# Patient Record
Sex: Female | Born: 1960 | Race: Black or African American | Hispanic: No | Smoking: Current every day smoker
Health system: Southern US, Community
[De-identification: ages and names within clinical notes are randomized; demographics above are authoritative.]

## PROBLEM LIST (undated history)

## (undated) DIAGNOSIS — R011 Cardiac murmur, unspecified: Secondary | ICD-10-CM

## (undated) DIAGNOSIS — K219 Gastro-esophageal reflux disease without esophagitis: Secondary | ICD-10-CM

## (undated) DIAGNOSIS — K3184 Gastroparesis: Secondary | ICD-10-CM

## (undated) DIAGNOSIS — F32A Depression, unspecified: Secondary | ICD-10-CM

## (undated) DIAGNOSIS — R51 Headache: Secondary | ICD-10-CM

## (undated) DIAGNOSIS — F419 Anxiety disorder, unspecified: Secondary | ICD-10-CM

## (undated) DIAGNOSIS — R569 Unspecified convulsions: Secondary | ICD-10-CM

## (undated) DIAGNOSIS — I639 Cerebral infarction, unspecified: Secondary | ICD-10-CM

## (undated) DIAGNOSIS — Z9109 Other allergy status, other than to drugs and biological substances: Secondary | ICD-10-CM

## (undated) DIAGNOSIS — I509 Heart failure, unspecified: Secondary | ICD-10-CM

## (undated) DIAGNOSIS — E119 Type 2 diabetes mellitus without complications: Secondary | ICD-10-CM

## (undated) DIAGNOSIS — G4730 Sleep apnea, unspecified: Secondary | ICD-10-CM

## (undated) DIAGNOSIS — N189 Chronic kidney disease, unspecified: Secondary | ICD-10-CM

## (undated) DIAGNOSIS — I739 Peripheral vascular disease, unspecified: Secondary | ICD-10-CM

## (undated) DIAGNOSIS — N3090 Cystitis, unspecified without hematuria: Secondary | ICD-10-CM

## (undated) DIAGNOSIS — J189 Pneumonia, unspecified organism: Secondary | ICD-10-CM

## (undated) DIAGNOSIS — M797 Fibromyalgia: Secondary | ICD-10-CM

## (undated) DIAGNOSIS — J449 Chronic obstructive pulmonary disease, unspecified: Secondary | ICD-10-CM

## (undated) DIAGNOSIS — IMO0002 Reserved for concepts with insufficient information to code with codable children: Secondary | ICD-10-CM

## (undated) DIAGNOSIS — D649 Anemia, unspecified: Secondary | ICD-10-CM

## (undated) DIAGNOSIS — K589 Irritable bowel syndrome without diarrhea: Secondary | ICD-10-CM

## (undated) DIAGNOSIS — I10 Essential (primary) hypertension: Secondary | ICD-10-CM

## (undated) DIAGNOSIS — F329 Major depressive disorder, single episode, unspecified: Secondary | ICD-10-CM

## (undated) HISTORY — PX: COLONOSCOPY: SHX174

## (undated) HISTORY — PX: NECK SURGERY: SHX720

## (undated) HISTORY — PX: DILATION AND CURETTAGE OF UTERUS: SHX78

## (undated) HISTORY — PX: CHOLECYSTECTOMY: SHX55

---

## 1998-03-17 HISTORY — PX: PARTIAL HYSTERECTOMY: SHX80

## 2012-01-31 ENCOUNTER — Ambulatory Visit: Payer: Medicare Other | Attending: Gastroenterology | Admitting: Physical Therapy

## 2012-01-31 DIAGNOSIS — M2569 Stiffness of other specified joint, not elsewhere classified: Secondary | ICD-10-CM | POA: Insufficient documentation

## 2012-01-31 DIAGNOSIS — M545 Low back pain, unspecified: Secondary | ICD-10-CM | POA: Insufficient documentation

## 2012-01-31 DIAGNOSIS — IMO0001 Reserved for inherently not codable concepts without codable children: Secondary | ICD-10-CM | POA: Insufficient documentation

## 2012-01-31 DIAGNOSIS — M2420 Disorder of ligament, unspecified site: Secondary | ICD-10-CM | POA: Insufficient documentation

## 2012-01-31 DIAGNOSIS — M629 Disorder of muscle, unspecified: Secondary | ICD-10-CM | POA: Insufficient documentation

## 2012-02-01 ENCOUNTER — Ambulatory Visit: Payer: Medicare Other | Admitting: Physical Therapy

## 2012-02-06 ENCOUNTER — Ambulatory Visit: Payer: Medicare Other | Admitting: Physical Therapy

## 2012-02-14 ENCOUNTER — Ambulatory Visit: Payer: Medicare Other | Admitting: Physical Therapy

## 2012-02-20 ENCOUNTER — Ambulatory Visit: Payer: PRIVATE HEALTH INSURANCE | Attending: Gastroenterology | Admitting: Physical Therapy

## 2012-02-20 DIAGNOSIS — M2420 Disorder of ligament, unspecified site: Secondary | ICD-10-CM | POA: Insufficient documentation

## 2012-02-20 DIAGNOSIS — M545 Low back pain, unspecified: Secondary | ICD-10-CM | POA: Insufficient documentation

## 2012-02-20 DIAGNOSIS — IMO0001 Reserved for inherently not codable concepts without codable children: Secondary | ICD-10-CM | POA: Insufficient documentation

## 2012-02-20 DIAGNOSIS — M629 Disorder of muscle, unspecified: Secondary | ICD-10-CM | POA: Insufficient documentation

## 2012-02-20 DIAGNOSIS — M2569 Stiffness of other specified joint, not elsewhere classified: Secondary | ICD-10-CM | POA: Insufficient documentation

## 2012-02-27 ENCOUNTER — Ambulatory Visit: Payer: PRIVATE HEALTH INSURANCE | Admitting: Physical Therapy

## 2012-03-05 ENCOUNTER — Ambulatory Visit: Payer: PRIVATE HEALTH INSURANCE | Admitting: Physical Therapy

## 2012-03-12 ENCOUNTER — Ambulatory Visit: Payer: PRIVATE HEALTH INSURANCE | Admitting: Physical Therapy

## 2012-03-19 ENCOUNTER — Ambulatory Visit: Payer: PRIVATE HEALTH INSURANCE | Admitting: Physical Therapy

## 2012-09-25 ENCOUNTER — Emergency Department (HOSPITAL_COMMUNITY)
Admission: EM | Admit: 2012-09-25 | Discharge: 2012-09-25 | Disposition: A | Discharge status: Home / Self Care | Payer: PRIVATE HEALTH INSURANCE | Attending: Emergency Medicine | Admitting: Emergency Medicine

## 2012-09-25 ENCOUNTER — Emergency Department (HOSPITAL_COMMUNITY): Payer: PRIVATE HEALTH INSURANCE

## 2012-09-25 ENCOUNTER — Encounter (HOSPITAL_COMMUNITY): Payer: Self-pay | Admitting: Emergency Medicine

## 2012-09-25 DIAGNOSIS — N189 Chronic kidney disease, unspecified: Secondary | ICD-10-CM | POA: Insufficient documentation

## 2012-09-25 DIAGNOSIS — Z8673 Personal history of transient ischemic attack (TIA), and cerebral infarction without residual deficits: Secondary | ICD-10-CM | POA: Insufficient documentation

## 2012-09-25 DIAGNOSIS — E119 Type 2 diabetes mellitus without complications: Secondary | ICD-10-CM | POA: Insufficient documentation

## 2012-09-25 DIAGNOSIS — F17200 Nicotine dependence, unspecified, uncomplicated: Secondary | ICD-10-CM | POA: Insufficient documentation

## 2012-09-25 DIAGNOSIS — Z8719 Personal history of other diseases of the digestive system: Secondary | ICD-10-CM | POA: Insufficient documentation

## 2012-09-25 DIAGNOSIS — Z79899 Other long term (current) drug therapy: Secondary | ICD-10-CM | POA: Insufficient documentation

## 2012-09-25 DIAGNOSIS — R51 Headache: Secondary | ICD-10-CM | POA: Insufficient documentation

## 2012-09-25 DIAGNOSIS — Z87448 Personal history of other diseases of urinary system: Secondary | ICD-10-CM | POA: Insufficient documentation

## 2012-09-25 DIAGNOSIS — Z880 Allergy status to penicillin: Secondary | ICD-10-CM | POA: Insufficient documentation

## 2012-09-25 DIAGNOSIS — R569 Unspecified convulsions: Secondary | ICD-10-CM

## 2012-09-25 HISTORY — DX: Cerebral infarction, unspecified: I63.9

## 2012-09-25 HISTORY — DX: Type 2 diabetes mellitus without complications: E11.9

## 2012-09-25 HISTORY — DX: Cystitis, unspecified without hematuria: N30.90

## 2012-09-25 HISTORY — DX: Unspecified convulsions: R56.9

## 2012-09-25 HISTORY — DX: Gastroparesis: K31.84

## 2012-09-25 HISTORY — DX: Chronic kidney disease, unspecified: N18.9

## 2012-09-25 LAB — CBC WITH DIFFERENTIAL/PLATELET
Basophils Relative: 0 % (ref 0–1)
HCT: 35.4 % — ABNORMAL LOW (ref 36.0–46.0)
Hemoglobin: 12.3 g/dL (ref 12.0–15.0)
Lymphs Abs: 1.4 10*3/uL (ref 0.7–4.0)
MCHC: 34.7 g/dL (ref 30.0–36.0)
Monocytes Absolute: 0.3 10*3/uL (ref 0.1–1.0)
Monocytes Relative: 6 % (ref 3–12)
Neutro Abs: 3.2 10*3/uL (ref 1.7–7.7)
Neutrophils Relative %: 65 % (ref 43–77)
RBC: 4.42 MIL/uL (ref 3.87–5.11)

## 2012-09-25 LAB — BASIC METABOLIC PANEL
BUN: 27 mg/dL — ABNORMAL HIGH (ref 6–23)
CO2: 20 mEq/L (ref 19–32)
Chloride: 103 mEq/L (ref 96–112)
Creatinine, Ser: 1.42 mg/dL — ABNORMAL HIGH (ref 0.50–1.10)
Glucose, Bld: 88 mg/dL (ref 70–99)
Potassium: 3.3 mEq/L — ABNORMAL LOW (ref 3.5–5.1)

## 2012-09-25 LAB — RAPID URINE DRUG SCREEN, HOSP PERFORMED
Barbiturates: NOT DETECTED
Benzodiazepines: NOT DETECTED
Cocaine: NOT DETECTED
Opiates: NOT DETECTED
Tetrahydrocannabinol: POSITIVE — AB

## 2012-09-25 LAB — URINALYSIS, ROUTINE W REFLEX MICROSCOPIC
Bilirubin Urine: NEGATIVE
Glucose, UA: NEGATIVE mg/dL
Hgb urine dipstick: NEGATIVE
Ketones, ur: NEGATIVE mg/dL
Nitrite: NEGATIVE
Specific Gravity, Urine: 1.013 (ref 1.005–1.030)
pH: 6 (ref 5.0–8.0)

## 2012-09-25 MED ORDER — HYDROMORPHONE HCL PF 1 MG/ML IJ SOLN
1.0000 mg | Freq: Once | INTRAMUSCULAR | Status: AC
  Administered 2012-09-25: 1 mg via INTRAVENOUS
  Filled 2012-09-25: qty 1

## 2012-09-25 MED ORDER — SODIUM CHLORIDE 0.9 % IV SOLN: INTRAVENOUS | Status: DC

## 2012-09-25 MED ORDER — ONDANSETRON HCL 4 MG/2ML IJ SOLN
4.0000 mg | Freq: Once | INTRAMUSCULAR | Status: AC
  Administered 2012-09-25: 4 mg via INTRAVENOUS
  Filled 2012-09-25: qty 2

## 2012-09-25 MED ORDER — SODIUM CHLORIDE 0.9 % IV BOLUS (SEPSIS)
1000.0000 mL | Freq: Once | INTRAVENOUS | Status: AC
  Administered 2012-09-25: 1000 mL via INTRAVENOUS

## 2012-09-25 MED ORDER — OXYCODONE-ACETAMINOPHEN 5-325 MG PO TABS: 1.0000 | ORAL_TABLET | ORAL | Status: DC | PRN

## 2012-09-25 NOTE — ED Notes (Signed)
Pt coming from pain management clinic. After pain injection in back, pt began to have focal activity that progressed to grand mal major for 1-2 minutes according to MD. Reported to EMS that pt was slightly postictal afterwords, and could not remember month then had another episode of seizure like activity. EMS reported that while pt was being transported she had grand mal like shaking but talked during the entire episode with no post ictal period. Pt has remote hx of seizures in 2002

## 2012-09-25 NOTE — ED Notes (Signed)
Pt states she has a HA. Pt states it is worse than her normal migraine

## 2012-09-25 NOTE — Progress Notes (Signed)
Patient reports her pcp is Dr. Donia Guiles with [facility]

## 2012-09-25 NOTE — ED Provider Notes (Signed)
CSN: [id number]     Arrival date & time 09/25/12  1234 History     First MD Initiated Contact with Patient 09/25/12 1301     Chief Complaint  Patient presents with  . Seizures   (Consider location/radiation/quality/duration/timing/severity/associated sxs/prior Treatment) HPI Comments: Di Jasmer is a 52 y.o. female who presents for evaluation of seizure. She was at her pain management. Doctor's office today, and after she received lumbosacral injections for pain, she had a focal, then generalized seizure.  She does not know what medicine she she got. EMS transported her here. During EMS transport, she had another generalized seizure. During that seizure. She was able to talk to EMS throughout. He did not report a post-ictal state. She denies recent illness. Her pain management doctor stopped giving her narcotics because she tested positive for THC. She denies recent illnesses, including fever, chills, cough, shortness of breath, change in bowel or urinary habits, weakness, dizziness, or paresthesia. There are no other known modifying factors.  Patient is a 52 y.o. female presenting with seizures. The history is provided by the patient.  Seizures   Past Medical History  Diagnosis Date  . Seizures   . Cystitis   . Gastroparesis   . Stroke 2008 and 2010  . Type 2 diabetes mellitus   . Chronic kidney disease (CKD)    History reviewed. No pertinent past surgical history. History reviewed. No pertinent family history. History  Substance Use Topics  . Smoking status: Current Every Day Smoker  . Smokeless tobacco: Not on file  . Alcohol Use: No   OB History   Grav Para Term Preterm Abortions TAB SAB Ect Mult Living                 Review of Systems  Neurological: Positive for seizures.  All other systems reviewed and are negative.    Allergies  Codeine; Doxycycline; Macrolides and ketolides; Penicillins; Shellfish allergy; Fish allergy; Gluten meal;  Hydrocodone-acetaminophen; Other; Vancomycin; Bactrim; Ciprofloxacin hcl; and Eggs or egg-derived products  Home Medications   Current Outpatient Rx  Name  Route  Sig  Dispense  Refill  . buPROPion (WELLBUTRIN XL) 150 MG 24 hr tablet   Oral   Take 150 mg by mouth daily.         . cholecalciferol (VITAMIN D) 1000 UNITS tablet   Oral   Take 1,000 Units by mouth daily.         . clonazePAM (KLONOPIN) 1 MG tablet   Oral   Take 1 mg by mouth 2 (two) times daily.         Marland Kitchen dexlansoprazole (DEXILANT) 60 MG capsule   Oral   Take 60 mg by mouth daily.         . diphenhydrAMINE (BENADRYL) 25 MG tablet   Oral   Take 25 mg by mouth every 6 (six) hours as needed for itching or allergies (allergic reactions).         . Fluticasone-Salmeterol (ADVAIR) 100-50 MCG/DOSE AEPB   Inhalation   Inhale 1 puff into the lungs every 12 (twelve) hours.         . hydrOXYzine (VISTARIL) 50 MG capsule   Oral   Take 50 mg by mouth every 8 (eight) hours as needed for anxiety.         . Linaclotide (LINZESS) 145 MCG CAPS capsule   Oral   Take 145 mcg by mouth daily. 30 minutes before eating         .  loratadine (CLARITIN) 10 MG tablet   Oral   Take 10 mg by mouth daily.         . montelukast (SINGULAIR) 10 MG tablet   Oral   Take 10 mg by mouth at bedtime.         . pentosan polysulfate (ELMIRON) 100 MG capsule   Oral   Take 100 mg by mouth 3 (three) times daily before meals.         . potassium chloride SA (K-DUR,KLOR-CON) 20 MEQ tablet   Oral   Take 20 mEq by mouth 2 (two) times daily.         . promethazine (PHENERGAN) 25 MG tablet   Oral   Take 25 mg by mouth 3 (three) times daily as needed for nausea.         Marland Kitchen tiZANidine (ZANAFLEX) 4 MG tablet   Oral   Take 4 mg by mouth 3 (three) times daily.         . traZODone (DESYREL) 150 MG tablet   Oral   Take 150-300 mg by mouth at bedtime.         . triamterene-hydrochlorothiazide (MAXZIDE-25) 37.5-25 MG  per tablet   Oral   Take 1 tablet by mouth daily.          BP 111/73  Pulse 68  Temp(Src) 98.4 F (36.9 C) (Oral)  Resp 13  SpO2 96% Physical Exam  Nursing note and vitals reviewed. Constitutional: She is oriented to person, place, and time. She appears well-developed and well-nourished.  HENT:  Head: Normocephalic and atraumatic.  No visible tongue trauma  Eyes: Conjunctivae and EOM are normal. Pupils are equal, round, and reactive to light.  Neck: Normal range of motion and phonation normal. Neck supple.  Cardiovascular: Normal rate, regular rhythm and intact distal pulses.   Pulmonary/Chest: Effort normal and breath sounds normal. She exhibits no tenderness.  Abdominal: Soft. She exhibits no distension. There is no tenderness. There is no guarding.  Musculoskeletal: Normal range of motion.  Neurological: She is alert and oriented to person, place, and time. She has normal strength. She exhibits normal muscle tone.  No no aphasia, no dysarthria, no nystagmus.  Skin: Skin is warm and dry.  Psychiatric: She has a normal mood and affect. Her behavior is normal. Judgment and thought content normal.    ED Course   Procedures (including critical care time)  1333- consultation with Dr. Jordan Likes, her pain managing physician, who attended her today during the episode of shaking. He, states that she received 80 mg Triamcinolone under fluoroscopic guidance. Then, when she attempted to stand up, she had a far away look at her eyes, laid back down, and had a generalized tonic-clonic seizure for 60 seconds. She then awoke immediately, and was conversant. About 3 minutes later, she had another episode, again without a post-ictal state. This was her fourth injection. She tolerated the others without problem. The patient had told him earlier that she did not take her Klonopin today. He reiterated the other things at the patient stated above.   Evaluation for recurrent seizure, initiated. The  seizure pads are on the patient's bed.  4:50 PM Reevaluation with update and discussion. After initial assessment and treatment, an updated evaluation reveals she continues to have a headache. Vital signs are repeated and normal. Parisa Pinela L     Labs Reviewed  CBC WITH DIFFERENTIAL - Abnormal; Notable for the following:    HCT 35.4 (*)    Platelets 139 (*)  All other components within normal limits  BASIC METABOLIC PANEL - Abnormal; Notable for the following:    Potassium 3.3 (*)    BUN 27 (*)    Creatinine, Ser 1.42 (*)    GFR calc non Af Amer 42 (*)    GFR calc Af Amer 49 (*)    All other components within normal limits  URINE RAPID DRUG SCREEN (HOSP PERFORMED) - Abnormal; Notable for the following:    Tetrahydrocannabinol POSITIVE (*)    All other components within normal limits  URINALYSIS, ROUTINE W REFLEX MICROSCOPIC  ETHANOL   Ct Head Wo Contrast  09/25/2012   *RADIOLOGY REPORT*  Clinical Data: Seizures; recent trauma  CT HEAD WITHOUT CONTRAST  Technique:  Contiguous axial images were obtained from the base of the skull through the vertex without contrast. Study was obtained within 24 hours of patient arrival at the emergency department.  Comparison:  Brain MRI Jun 14, 2012 and brain CT Jun 14, 2012  Findings: The ventricles are normal in size and configuration. There is no mass, hemorrhage, extra-axial fluid collection, or midline shift.  There is minimal small vessel disease in the centra semiovale bilaterally.  Wallace Cullens and white compartments elsewhere normal.  There is no acute appearing infarct on this study.  Bony calvarium appears intact.  Mastoid air cells are clear.  IMPRESSION: Minimal periventricular small vessel disease.  Study otherwise unremarkable.   Original Report Authenticated By: Bretta Bang, M.D.   1. Seizure   2. Headache     MDM  Apparent seizure. No clear cause. History seizures in the past, with toxemia and 1 other, remotely. No recurrent seizures  in ED. She is stable for discharge with outpatient management. Her headache is nonspecific. I doubt meningitis, intracranial bleeding, sinusitis, or encephalitis. Doubt metabolic instability, serious bacterial infection or impending vascular collapse; the patient is stable for discharge.  Nursing Notes Reviewed/ Care Coordinated, and agree without changes. Applicable Imaging Reviewed.  Interpretation of Laboratory Data incorporated into ED treatment   Plan: Home Medications- usual; Home Treatments and Observation- NO DRIVING until released, rest, watch for progressive sx; return here if the recommended treatment, does not improve the symptoms; Recommended follow up- PCP prn, Neurology 1-2 weeks.     Flint Melter, MD 09/26/12 [phone]

## 2012-11-20 NOTE — H&P (Signed)
History of Present Illness  The patient is a 52 year old female who presents today for follow up of their back. The patient is being followed for their back pain and cervical disk displacement. They are now week(s) out from last visit. Symptoms reported today include: stiffness, while the patient does not report symptoms of: pain. The patient states that they are doing well. Current treatment includes: pain medications. The following medication has been used for pain control: none. The patient reports their current pain level to be mild. The patient presents today following ESI (10/23/12-- much improved ). Note for "Follow-up back": Patient has had a head ache for several weeks and went to [facility] fro evaluation, but they didn't' give her any explanation     Subjective Transcription  This is a 52 year old black female who comes in for follow up visit for neck pain and radicular symptoms. She recently had a right C6 selective nerve root block by Dr. Ethelene Hal on 10/23/12. Did not have much improvement with that. C spine MRI scan performed 09/17/12 showed C5-6 mild disc bulge osteophyte complex extensive to the right, moderate right and left foraminal uncovertebral hypertrophy and small right paracentral protrusion with mild flattening of the ventral thecal sac and minimal flattening of the ventral cord. Mild central canal stenosis. Moderate right foraminal stenosis with abutment of the exiting right C6 nerve root. Right arm symptoms unchanged. She is wanting to proceed with scheduling definitive treatment with C5-6 ACDF. The patient's medical history is reviewed and she is allergic to multiple antibiotics including Cipro, Doxycycline, MacroBid, penicillin, TMP, SMX, Vancomycin. All reactions are rash. She states she has taken Cefdinir in the past and only had GI upset.     Allergies Ciprofloxacin *CHEMICALS* Codeine Phosphate *ANALGESICS - OPIOID* Doxycycline Hyclate  *TETRACYCLINES* Nitrofurantoin Monohyd Macro *URINARY ANTI-INFECTIVES* Macrobid *URINARY ANTI-INFECTIVES* Penicillamine *ASSORTED CLASSES* Fish Meal *CHEMICALS*. shellfish and fish Sulfamethoxazole-Trimethoprim *ANTI-INFECTIVE AGENTS - MISC.* Peanut Oil *CHEMICALS*. ALL TREE NUTS Egg/Pro *DIETARY PRODUCTS/DIETARY MANAGEMENT PRODUCTS* Metoclopramide HCl *GASTROINTESTINAL AGENTS - MISC.* Propoxyphene N-Acetaminophen *ANALGESICS - OPIOID* TraMADol HCl *CHEMICALS* Adhesive Bandages *MEDICAL DEVICES* Hydrocodone-Acetaminophen *ANALGESICS - OPIOID* Vancomycin HCl *ANTI-INFECTIVE AGENTS - MISC.* Influenza Virus Vaccine *VACCINES* Wheat Germ Oil *VITAMINS*. WHEAT GLUTEN    Social History Alcohol use. former drinker Current work status. disabled Drug/Alcohol Rehab (Currently). no Drug/Alcohol Rehab (Previously). no Exercise. Exercises daily; does other Illicit drug use. no Living situation. live alone Marital status. divorced Number of flights of stairs before winded. less than 1 Pain Contract. yes Tobacco / smoke exposure. yes Tobacco use. current every day smoker; smoke(d) less than 1/2 pack(s) per day; uses 1 can(s) smokeless per week    Medication History OxyCODONE HCl (10MG  Tablet, 1/2 Oral four times daily, as needed) Active. Triamterene-HCTZ ( Oral) Specific dose unknown - Active. Albuterol Sulfate HFA (108 (90 Base)MCG/ACT Aerosol Soln, Inhalation as needed) Active. BuPROPion HCl ER (SR) (150MG  Tablet ER 12HR, 1 Oral daily) Active. Cholecalciferol (2000UNIT Tablet Disperse, 1 Oral daily) Active. ClonazePAM (1MG  Tablet, 1 Oral daily) Active. Dexlansoprazole (60MG  Capsule DR, 1 Oral daily) Active. EPINEPHrine HCl (Anaphylaxis) ( Intramuscular) Specific dose unknown - Active. Diflucan ( Oral) Specific dose unknown - Active. Atarax (50MG  Tablet, Oral as needed) Active. Linzess ( Capsule, 1 Oral four times daily) Active. Claritin (10MG  Capsule, 1 Oral  daily) Active. Amitiza ( Capsule, 1 Oral two times daily) Active. Magnesium Hydroxide ( Oral) Specific dose unknown - Active. Singulair (10MG  Tablet, 1 Oral four times daily) Active. Nitrostat (0.4MG  Tab Sublingual,  Sublingual as needed) Active. Elmiron (100MG  Capsule, 1 Oral three times daily) Active. MiraLax ( Oral as needed) Active. Klor-Con M20 ( Tablet ER, 1 Oral two times daily) Active. Phenergan (25MG  Tablet, 1 Oral every six hours, as needed) Active. Fleet Enema (7-19GM/118ML Enema, Rectal as needed) Active. Zanaflex (4MG  Tablet, 1 Oral as needed) Active. Topamax (50MG  Tablet, 1 Oral two times daily) Active. Desyrel (150MG  Tablet, 1 Oral every four hours, as needed) Active. Medications Reconciled.    Objective Transcription  Pleasant black female alert and oriented times three in no acute distress. No increase in respiratory effort. Cervical spine good range of motion but with discomfort. Lungs clear to auscultation bilaterally. No wheezing noted. Heart regular rate and rhythm, S1 and S2. No murmurs. Head is normocephalic, atraumatic. PERRLA. Abdomen round and nondistended. Neurologically intact. Skin warm and dry.     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 Neck pain and right upper extremity radiculopathy. Failed conservative treatment.   Plans Transcription  We will proceed with C5-6 ACDF. Surgical procedure along with potential risks and complications discussed. All questions answered. We will need to get preop medical clearance before final scheduling.  She returns for follow up. She had an excellent response to the C6 selective nerve root block. At this point we have had a long discussion about procedure and she would like to proceed with surgery. All appropriate risks including infection, bleeding, nerve damage, death, stroke, paralysis, failure to heal, need for further surgery, ongoing or worse pain, throat pain,  swallowing difficulties, loss of bowel and bladder control and nonunion were discussed. We also discussed postoperative pain control which would be Oxycodone with descending scripts over three month period. After three months all narcotics would be stopped. She has expressed an understanding of the procedure as well as the risks. She would like to proceed.

## 2012-11-28 ENCOUNTER — Other Ambulatory Visit (HOSPITAL_COMMUNITY): Payer: 59

## 2012-12-06 ENCOUNTER — Ambulatory Visit: Admit: 2012-12-06 | Payer: 59 | Admitting: Orthopedic Surgery

## 2012-12-06 SURGERY — ANTERIOR CERVICAL DECOMPRESSION/DISCECTOMY FUSION 1 LEVEL
Anesthesia: General

## 2013-01-16 ENCOUNTER — Encounter (HOSPITAL_COMMUNITY): Payer: Self-pay | Admitting: Pharmacy Technician

## 2013-01-18 ENCOUNTER — Encounter (HOSPITAL_COMMUNITY)
Admission: RE | Admit: 2013-01-18 | Discharge: 2013-01-18 | Disposition: A | Discharge status: Home / Self Care | Payer: PRIVATE HEALTH INSURANCE | Source: Ambulatory Visit | Attending: Orthopedic Surgery | Admitting: Orthopedic Surgery

## 2013-01-18 ENCOUNTER — Encounter (HOSPITAL_COMMUNITY): Payer: Self-pay

## 2013-01-18 ENCOUNTER — Other Ambulatory Visit (HOSPITAL_COMMUNITY): Payer: Self-pay | Admitting: *Deleted

## 2013-01-18 ENCOUNTER — Encounter (INDEPENDENT_AMBULATORY_CARE_PROVIDER_SITE_OTHER): Payer: Self-pay

## 2013-01-18 DIAGNOSIS — Z01810 Encounter for preprocedural cardiovascular examination: Secondary | ICD-10-CM | POA: Insufficient documentation

## 2013-01-18 DIAGNOSIS — Z01812 Encounter for preprocedural laboratory examination: Secondary | ICD-10-CM | POA: Diagnosis not present

## 2013-01-18 DIAGNOSIS — Z01818 Encounter for other preprocedural examination: Secondary | ICD-10-CM | POA: Insufficient documentation

## 2013-01-18 HISTORY — DX: Anxiety disorder, unspecified: F41.9

## 2013-01-18 HISTORY — DX: Gastro-esophageal reflux disease without esophagitis: K21.9

## 2013-01-18 HISTORY — DX: Peripheral vascular disease, unspecified: I73.9

## 2013-01-18 HISTORY — DX: Major depressive disorder, single episode, unspecified: F32.9

## 2013-01-18 HISTORY — DX: Reserved for concepts with insufficient information to code with codable children: IMO0002

## 2013-01-18 HISTORY — DX: Cardiac murmur, unspecified: R01.1

## 2013-01-18 HISTORY — DX: Sleep apnea, unspecified: G47.30

## 2013-01-18 HISTORY — DX: Anemia, unspecified: D64.9

## 2013-01-18 HISTORY — DX: Headache: R51

## 2013-01-18 HISTORY — DX: Essential (primary) hypertension: I10

## 2013-01-18 HISTORY — DX: Fibromyalgia: M79.7

## 2013-01-18 HISTORY — DX: Pneumonia, unspecified organism: J18.9

## 2013-01-18 HISTORY — DX: Depression, unspecified: F32.A

## 2013-01-18 HISTORY — DX: Irritable bowel syndrome, unspecified: K58.9

## 2013-01-18 HISTORY — DX: Chronic obstructive pulmonary disease, unspecified: J44.9

## 2013-01-18 HISTORY — DX: Other allergy status, other than to drugs and biological substances: Z91.09

## 2013-01-18 LAB — CBC
HCT: 33.4 % — ABNORMAL LOW (ref 36.0–46.0)
Hemoglobin: 11.3 g/dL — ABNORMAL LOW (ref 12.0–15.0)
MCH: 27.8 pg (ref 26.0–34.0)
MCHC: 33.8 g/dL (ref 30.0–36.0)
MCV: 82.3 fL (ref 78.0–100.0)
Platelets: 153 10*3/uL (ref 150–400)
RBC: 4.06 MIL/uL (ref 3.87–5.11)

## 2013-01-18 LAB — BASIC METABOLIC PANEL
Calcium: 8.8 mg/dL (ref 8.4–10.5)
Creatinine, Ser: 1.46 mg/dL — ABNORMAL HIGH (ref 0.50–1.10)
GFR calc Af Amer: 47 mL/min — ABNORMAL LOW (ref >90)
GFR calc non Af Amer: 41 mL/min — ABNORMAL LOW (ref >90)
Glucose, Bld: 85 mg/dL (ref 70–99)
Potassium: 3.3 mEq/L — ABNORMAL LOW (ref 3.5–5.1)
Sodium: 138 mEq/L (ref 135–145)

## 2013-01-18 LAB — SURGICAL PCR SCREEN
MRSA, PCR: NEGATIVE
Staphylococcus aureus: POSITIVE — AB

## 2013-01-18 NOTE — Pre-Procedure Instructions (Signed)
Courtney Trevino  01/18/2013   Your procedure is scheduled on:  Thursday, January 24, 2013 at 11:20 AM.   Report to [facility] Entrance "A" Admitting Office at 9:15 AM.   Call this number if you have problems the morning of surgery: [phone]   Remember:   Do not eat food or drink liquids after midnight Wednesday, 01/23/13.   Take these medicines the morning of surgery with A SIP OF WATER: clonazePAM (KLONOPIN), dexlansoprazole (DEXILANT), loratadine (CLARITIN), PARoxetine (PAXIL), pentosan polysulfate (ELMIRON), topiramate (TOPAMAX),  hydrOXYzine (VISTARIL) - if needed, fluticasone (FLONASE), Fluticasone-Salmeterol (ADVAIR)                  Do not wear jewelry, make-up or nail polish.  Do not wear lotions, powders, or perfumes. You may wear deodorant.  Do not shave 48 hours prior to surgery.   Do not bring valuables to the hospital.  [facility] is not responsible                  for any belongings or valuables.               Contacts, dentures or bridgework may not be worn into surgery.  Leave suitcase in the car. After surgery it may be brought to your room.  For patients admitted to the hospital, discharge time is determined by your                treatment team.            Special Instructions: Shower using CHG 2 nights before surgery and the night before surgery.  If you shower the day of surgery use CHG.  Use special wash - you have one bottle of CHG for all showers.  You should use approximately 1/3 of the bottle for each shower.   Please read over the following fact sheets that you were given: Pain Booklet, Coughing and Deep Breathing, MRSA Information and Surgical Site Infection Prevention

## 2013-01-18 NOTE — Progress Notes (Signed)
Called pt because she had not shown up for PAT appt. She is on her way, was somewhat lost. Gave her instructions on how to get here.
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
## 2013-01-23 MED ORDER — CLINDAMYCIN PHOSPHATE 600 MG/50ML IV SOLN: 600.0000 mg | Freq: Once | INTRAVENOUS | Status: DC

## 2013-01-23 NOTE — H&P (Signed)
History of Present Illness  The patient is a 52 year old female who presents today for follow up of their back. The patient is being followed for their back pain and cervical disk displacement. They are now week(s) out from last visit. Symptoms reported today include: stiffness, while the patient does not report symptoms of: pain. The patient states that they are doing well. Current treatment includes: pain medications. The following medication has been used for pain control: none. The patient reports their current pain level to be mild. The patient presents today following ESI (10/23/12-- much improved ). Note for "Follow-up back": Patient has had a head ache for several weeks and went to [facility] fro evaluation, but they didn't' give her any explanation  This is a 52 year old black female who comes in for follow up visit for neck pain and radicular symptoms. She recently had a right C6 selective nerve root block by Dr. Ethelene Hal on 10/23/12. Did not have much improvement with that. C spine MRI scan performed 09/17/12 showed C5-6 mild disc bulge osteophyte complex extensive to the right, moderate right and left foraminal uncovertebral hypertrophy and small right paracentral protrusion with mild flattening of the ventral thecal sac and minimal flattening of the ventral cord. Mild central canal stenosis. Moderate right foraminal stenosis with abutment of the exiting right C6 nerve root. Right arm symptoms unchanged. She is wanting to proceed with scheduling definitive treatment with C5-6 ACDF. The patient's medical history is reviewed and she is allergic to multiple antibiotics including Cipro, Doxycycline, MacroBid, penicillin, TMP, SMX, Vancomycin. All reactions are rash. She states she has taken Cefdinir in the past and only had GI upset.     Allergies Ciprofloxacin *CHEMICALS* Codeine Phosphate *ANALGESICS - OPIOID* Doxycycline Hyclate *TETRACYCLINES* Nitrofurantoin Monohyd Macro  *URINARY ANTI-INFECTIVES* Macrobid *URINARY ANTI-INFECTIVES* Penicillamine *ASSORTED CLASSES* Fish Meal *CHEMICALS*. shellfish and fish Sulfamethoxazole-Trimethoprim *ANTI-INFECTIVE AGENTS - MISC.* Peanut Oil *CHEMICALS*. ALL TREE NUTS Egg/Pro *DIETARY PRODUCTS/DIETARY MANAGEMENT PRODUCTS* Metoclopramide HCl *GASTROINTESTINAL AGENTS - MISC.* Propoxyphene N-Acetaminophen *ANALGESICS - OPIOID* TraMADol HCl *CHEMICALS* Adhesive Bandages *MEDICAL DEVICES* Hydrocodone-Acetaminophen *ANALGESICS - OPIOID* Vancomycin HCl *ANTI-INFECTIVE AGENTS - MISC.* Influenza Virus Vaccine *VACCINES* Wheat Germ Oil *VITAMINS*. WHEAT GLUTEN    Social History Alcohol use. former drinker Current work status. disabled Drug/Alcohol Rehab (Currently). no Drug/Alcohol Rehab (Previously). no Exercise. Exercises daily; does other Illicit drug use. no Living situation. live alone Marital status. divorced Number of flights of stairs before winded. less than 1 Pain Contract. yes Tobacco / smoke exposure. yes Tobacco use. current every day smoker; smoke(d) less than 1/2 pack(s) per day; uses 1 can(s) smokeless per week    Medication History OxyCODONE HCl (10MG  Tablet, 1/2 Oral four times daily, as needed) Active. Triamterene-HCTZ ( Oral) Specific dose unknown - Active. Albuterol Sulfate HFA (108 (90 Base)MCG/ACT Aerosol Soln, Inhalation as needed) Active. BuPROPion HCl ER (SR) (150MG  Tablet ER 12HR, 1 Oral daily) Active. Cholecalciferol (2000UNIT Tablet Disperse, 1 Oral daily) Active. ClonazePAM (1MG  Tablet, 1 Oral daily) Active. Dexlansoprazole (60MG  Capsule DR, 1 Oral daily) Active. EPINEPHrine HCl (Anaphylaxis) ( Intramuscular) Specific dose unknown - Active. Diflucan ( Oral) Specific dose unknown - Active. Atarax (50MG  Tablet, Oral as needed) Active. Linzess ( Capsule, 1 Oral four times daily) Active. Claritin (10MG  Capsule, 1 Oral daily) Active. Amitiza ( Capsule, 1  Oral two times daily) Active. Magnesium Hydroxide ( Oral) Specific dose unknown - Active. Singulair (10MG  Tablet, 1 Oral four times daily) Active. Nitrostat (0.4MG  Tab Sublingual, Sublingual as needed) Active. Elmiron (100MG   Capsule, 1 Oral three times daily) Active. MiraLax ( Oral as needed) Active. Klor-Con M20 ( Tablet ER, 1 Oral two times daily) Active. Phenergan (25MG  Tablet, 1 Oral every six hours, as needed) Active. Fleet Enema (7-19GM/118ML Enema, Rectal as needed) Active. Zanaflex (4MG  Tablet, 1 Oral as needed) Active. Topamax (50MG  Tablet, 1 Oral two times daily) Active. Desyrel (150MG  Tablet, 1 Oral every four hours, as needed) Active. Medications Reconciled.    Objective Transcription  Pleasant black female alert and oriented times three in no acute distress. No increase in respiratory effort. Cervical spine good range of motion but with discomfort. Lungs clear to auscultation bilaterally. No wheezing noted. Heart regular rate and rhythm, S1 and S2. No murmurs. Head is normocephalic, atraumatic. PERRLA. Abdomen round and nondistended. Neurologically intact. Skin warm and dry.     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 Neck pain and right upper extremity radiculopathy. Failed conservative treatment.     Plans Transcription  We will proceed with C5-6 ACDF. Surgical procedure along with potential risks and complications discussed. All questions answered. We will need to get preop medical clearance before final scheduling.    She had an excellent response to the C6 selective nerve root block. At this point we have had a long discussion about procedure and she would like to proceed with surgery. All appropriate risks including infection, bleeding, nerve damage, death, stroke, paralysis, failure to heal, need for further surgery, ongoing or worse pain, throat pain, swallowing difficulties, loss of bowel and bladder control  and nonunion were discussed. We also discussed postoperative pain control which would be Oxycodone with descending scripts over three month period. After three months all narcotics would be stopped. She has expressed an understanding of the procedure as well as the risks. She would like to proceed.

## 2013-01-24 ENCOUNTER — Inpatient Hospital Stay (HOSPITAL_COMMUNITY): Payer: PRIVATE HEALTH INSURANCE

## 2013-01-24 ENCOUNTER — Encounter (HOSPITAL_COMMUNITY): Payer: Self-pay | Admitting: Certified Registered"

## 2013-01-24 ENCOUNTER — Encounter (HOSPITAL_COMMUNITY): Payer: PRIVATE HEALTH INSURANCE | Admitting: Vascular Surgery

## 2013-01-24 ENCOUNTER — Encounter (HOSPITAL_COMMUNITY)
Admission: RE | Disposition: A | Discharge status: Skilled Nursing Facility | Payer: Self-pay | Source: Ambulatory Visit | Attending: Orthopedic Surgery

## 2013-01-24 ENCOUNTER — Inpatient Hospital Stay (HOSPITAL_COMMUNITY)
Admission: RE | Admit: 2013-01-24 | Discharge: 2013-01-25 | DRG: 473 | Disposition: A | Discharge status: Skilled Nursing Facility | Payer: PRIVATE HEALTH INSURANCE | Source: Ambulatory Visit | Attending: Orthopedic Surgery | Admitting: Orthopedic Surgery

## 2013-01-24 ENCOUNTER — Inpatient Hospital Stay (HOSPITAL_COMMUNITY): Payer: PRIVATE HEALTH INSURANCE | Admitting: Certified Registered"

## 2013-01-24 DIAGNOSIS — Z981 Arthrodesis status: Secondary | ICD-10-CM

## 2013-01-24 DIAGNOSIS — K219 Gastro-esophageal reflux disease without esophagitis: Secondary | ICD-10-CM | POA: Diagnosis present

## 2013-01-24 DIAGNOSIS — E119 Type 2 diabetes mellitus without complications: Secondary | ICD-10-CM | POA: Diagnosis present

## 2013-01-24 DIAGNOSIS — G709 Myoneural disorder, unspecified: Secondary | ICD-10-CM | POA: Diagnosis present

## 2013-01-24 DIAGNOSIS — F17200 Nicotine dependence, unspecified, uncomplicated: Secondary | ICD-10-CM | POA: Diagnosis present

## 2013-01-24 DIAGNOSIS — I10 Essential (primary) hypertension: Secondary | ICD-10-CM | POA: Diagnosis present

## 2013-01-24 DIAGNOSIS — M47812 Spondylosis without myelopathy or radiculopathy, cervical region: Secondary | ICD-10-CM | POA: Diagnosis present

## 2013-01-24 DIAGNOSIS — F3289 Other specified depressive episodes: Secondary | ICD-10-CM | POA: Diagnosis present

## 2013-01-24 DIAGNOSIS — F329 Major depressive disorder, single episode, unspecified: Secondary | ICD-10-CM | POA: Diagnosis present

## 2013-01-24 DIAGNOSIS — R569 Unspecified convulsions: Secondary | ICD-10-CM | POA: Diagnosis present

## 2013-01-24 DIAGNOSIS — I739 Peripheral vascular disease, unspecified: Secondary | ICD-10-CM | POA: Diagnosis present

## 2013-01-24 DIAGNOSIS — IMO0001 Reserved for inherently not codable concepts without codable children: Secondary | ICD-10-CM | POA: Diagnosis present

## 2013-01-24 DIAGNOSIS — M5380 Other specified dorsopathies, site unspecified: Secondary | ICD-10-CM | POA: Diagnosis present

## 2013-01-24 DIAGNOSIS — G4730 Sleep apnea, unspecified: Secondary | ICD-10-CM | POA: Diagnosis present

## 2013-01-24 DIAGNOSIS — Z880 Allergy status to penicillin: Secondary | ICD-10-CM

## 2013-01-24 DIAGNOSIS — M5020 Other cervical disc displacement, unspecified cervical region: Principal | ICD-10-CM | POA: Diagnosis present

## 2013-01-24 DIAGNOSIS — Z889 Allergy status to unspecified drugs, medicaments and biological substances status: Secondary | ICD-10-CM

## 2013-01-24 DIAGNOSIS — F411 Generalized anxiety disorder: Secondary | ICD-10-CM | POA: Diagnosis present

## 2013-01-24 DIAGNOSIS — Z8673 Personal history of transient ischemic attack (TIA), and cerebral infarction without residual deficits: Secondary | ICD-10-CM

## 2013-01-24 DIAGNOSIS — M542 Cervicalgia: Secondary | ICD-10-CM | POA: Diagnosis present

## 2013-01-24 HISTORY — PX: ANTERIOR CERVICAL DECOMP/DISCECTOMY FUSION: SHX1161

## 2013-01-24 LAB — GLUCOSE, CAPILLARY: Glucose-Capillary: 86 mg/dL (ref 70–99)

## 2013-01-24 SURGERY — ANTERIOR CERVICAL DECOMPRESSION/DISCECTOMY FUSION 1 LEVEL
Anesthesia: General | Site: Neck

## 2013-01-24 MED ORDER — METHOCARBAMOL 500 MG PO TABS
500.0000 mg | ORAL_TABLET | Freq: Four times a day (QID) | ORAL | Status: DC | PRN
  Administered 2013-01-24 – 2013-01-25 (×2): 500 mg via ORAL
  Filled 2013-01-24 (×2): qty 1

## 2013-01-24 MED ORDER — PAROXETINE HCL 20 MG PO TABS
20.0000 mg | ORAL_TABLET | Freq: Every day | ORAL | Status: DC
  Administered 2013-01-24 – 2013-01-25 (×2): 20 mg via ORAL
  Filled 2013-01-24 (×3): qty 1

## 2013-01-24 MED ORDER — TRIAMTERENE-HCTZ 37.5-25 MG PO TABS
1.0000 | ORAL_TABLET | Freq: Every day | ORAL | Status: DC
  Administered 2013-01-24 – 2013-01-25 (×2): 1 via ORAL
  Filled 2013-01-24 (×4): qty 1

## 2013-01-24 MED ORDER — LACTATED RINGERS IV SOLN
INTRAVENOUS | Status: DC
  Administered 2013-01-24: 50 mL/h via INTRAVENOUS

## 2013-01-24 MED ORDER — CLINDAMYCIN PHOSPHATE 600 MG/50ML IV SOLN
INTRAVENOUS | Status: AC
  Filled 2013-01-24: qty 50

## 2013-01-24 MED ORDER — EPINEPHRINE 0.3 MG/0.3ML IJ SOAJ: 0.3000 mg | Freq: Once | INTRAMUSCULAR | Status: DC | PRN

## 2013-01-24 MED ORDER — ETOMIDATE 2 MG/ML IV SOLN
INTRAVENOUS | Status: DC | PRN
  Administered 2013-01-24: 20 mg via INTRAVENOUS

## 2013-01-24 MED ORDER — MENTHOL 3 MG MT LOZG
1.0000 | LOZENGE | OROMUCOSAL | Status: DC | PRN
  Filled 2013-01-24: qty 9

## 2013-01-24 MED ORDER — FENTANYL CITRATE 0.05 MG/ML IJ SOLN
25.0000 ug | INTRAMUSCULAR | Status: DC | PRN
  Administered 2013-01-24 (×2): 50 ug via INTRAVENOUS

## 2013-01-24 MED ORDER — SODIUM CHLORIDE 0.9 % IV SOLN: 250.0000 mL | INTRAVENOUS | Status: DC

## 2013-01-24 MED ORDER — ONDANSETRON HCL 4 MG/2ML IJ SOLN
INTRAMUSCULAR | Status: DC | PRN
  Administered 2013-01-24: 4 mg via INTRAVENOUS

## 2013-01-24 MED ORDER — MIDAZOLAM HCL 5 MG/5ML IJ SOLN
INTRAMUSCULAR | Status: DC | PRN
  Administered 2013-01-24: 2 mg via INTRAVENOUS

## 2013-01-24 MED ORDER — DIPHENHYDRAMINE HCL 25 MG PO TABS
25.0000 mg | ORAL_TABLET | Freq: Four times a day (QID) | ORAL | Status: DC | PRN
  Administered 2013-01-25 (×2): 25 mg via ORAL
  Filled 2013-01-24 (×3): qty 1

## 2013-01-24 MED ORDER — HYDROXYZINE HCL 25 MG PO TABS
50.0000 mg | ORAL_TABLET | Freq: Two times a day (BID) | ORAL | Status: DC | PRN
  Administered 2013-01-24: 50 mg via ORAL
  Filled 2013-01-24: qty 2

## 2013-01-24 MED ORDER — SUFENTANIL CITRATE 50 MCG/ML IV SOLN
INTRAVENOUS | Status: DC | PRN
  Administered 2013-01-24 (×2): 10 ug via INTRAVENOUS
  Administered 2013-01-24: 20 ug via INTRAVENOUS
  Administered 2013-01-24: 10 ug via INTRAVENOUS

## 2013-01-24 MED ORDER — GLYCOPYRROLATE 0.2 MG/ML IJ SOLN
INTRAMUSCULAR | Status: DC | PRN
  Administered 2013-01-24: 0.4 mg via INTRAVENOUS

## 2013-01-24 MED ORDER — FLUTICASONE PROPIONATE 50 MCG/ACT NA SUSP
1.0000 | Freq: Two times a day (BID) | NASAL | Status: DC
  Administered 2013-01-24 – 2013-01-25 (×2): 1 via NASAL
  Filled 2013-01-24 (×2): qty 16

## 2013-01-24 MED ORDER — NEOSTIGMINE METHYLSULFATE 1 MG/ML IJ SOLN
INTRAMUSCULAR | Status: DC | PRN
  Administered 2013-01-24: 3 mg via INTRAVENOUS

## 2013-01-24 MED ORDER — POTASSIUM CHLORIDE CRYS ER 20 MEQ PO TBCR
20.0000 meq | EXTENDED_RELEASE_TABLET | Freq: Two times a day (BID) | ORAL | Status: DC
  Administered 2013-01-24 – 2013-01-25 (×2): 20 meq via ORAL
  Filled 2013-01-24 (×4): qty 1

## 2013-01-24 MED ORDER — 0.9 % SODIUM CHLORIDE (POUR BTL) OPTIME
TOPICAL | Status: DC | PRN
  Administered 2013-01-24: 1000 mL

## 2013-01-24 MED ORDER — ZOLPIDEM TARTRATE 5 MG PO TABS: 5.0000 mg | ORAL_TABLET | Freq: Every evening | ORAL | Status: DC | PRN

## 2013-01-24 MED ORDER — ALBUTEROL SULFATE HFA 108 (90 BASE) MCG/ACT IN AERS
1.0000 | INHALATION_SPRAY | Freq: Four times a day (QID) | RESPIRATORY_TRACT | Status: DC | PRN
  Filled 2013-01-24: qty 6.7

## 2013-01-24 MED ORDER — HYDROXYZINE PAMOATE 50 MG PO CAPS
50.0000 mg | ORAL_CAPSULE | Freq: Two times a day (BID) | ORAL | Status: DC | PRN
  Filled 2013-01-24 (×2): qty 1

## 2013-01-24 MED ORDER — MOMETASONE FURO-FORMOTEROL FUM 100-5 MCG/ACT IN AERO
2.0000 | INHALATION_SPRAY | Freq: Two times a day (BID) | RESPIRATORY_TRACT | Status: DC
  Administered 2013-01-25: 2 via RESPIRATORY_TRACT
  Filled 2013-01-24 (×2): qty 8.8

## 2013-01-24 MED ORDER — CLINDAMYCIN PHOSPHATE 900 MG/50ML IV SOLN
900.0000 mg | INTRAVENOUS | Status: AC
  Administered 2013-01-24: 900 mg via INTRAVENOUS
  Filled 2013-01-24: qty 50

## 2013-01-24 MED ORDER — PHENOL 1.4 % MT LIQD: 1.0000 | OROMUCOSAL | Status: DC | PRN

## 2013-01-24 MED ORDER — ONDANSETRON HCL 4 MG/2ML IJ SOLN: 4.0000 mg | INTRAMUSCULAR | Status: DC | PRN

## 2013-01-24 MED ORDER — FENTANYL CITRATE 0.05 MG/ML IJ SOLN
INTRAMUSCULAR | Status: AC
  Filled 2013-01-24: qty 2

## 2013-01-24 MED ORDER — LIDOCAINE HCL (CARDIAC) 20 MG/ML IV SOLN
INTRAVENOUS | Status: DC | PRN
  Administered 2013-01-24: 100 mg via INTRAVENOUS

## 2013-01-24 MED ORDER — HYDROMORPHONE HCL PF 1 MG/ML IJ SOLN
INTRAMUSCULAR | Status: AC
  Filled 2013-01-24: qty 1

## 2013-01-24 MED ORDER — MONTELUKAST SODIUM 10 MG PO TABS
10.0000 mg | ORAL_TABLET | Freq: Every day | ORAL | Status: DC
  Administered 2013-01-24: 10 mg via ORAL
  Filled 2013-01-24 (×3): qty 1

## 2013-01-24 MED ORDER — THROMBIN 20000 UNITS EX SOLR
CUTANEOUS | Status: AC
  Filled 2013-01-24: qty 20000

## 2013-01-24 MED ORDER — DEXAMETHASONE SODIUM PHOSPHATE 4 MG/ML IJ SOLN
4.0000 mg | Freq: Once | INTRAMUSCULAR | Status: DC
  Filled 2013-01-24: qty 1

## 2013-01-24 MED ORDER — ACETAMINOPHEN 10 MG/ML IV SOLN
1000.0000 mg | Freq: Four times a day (QID) | INTRAVENOUS | Status: DC
  Administered 2013-01-24: 1000 mg via INTRAVENOUS
  Filled 2013-01-24: qty 100

## 2013-01-24 MED ORDER — CLINDAMYCIN PHOSPHATE 900 MG/50ML IV SOLN
900.0000 mg | Freq: Three times a day (TID) | INTRAVENOUS | Status: DC
  Administered 2013-01-24 – 2013-01-25 (×2): 900 mg via INTRAVENOUS
  Filled 2013-01-24 (×5): qty 50

## 2013-01-24 MED ORDER — THROMBIN 20000 UNITS EX SOLR
CUTANEOUS | Status: DC | PRN
  Administered 2013-01-24: 15:00:00 via TOPICAL

## 2013-01-24 MED ORDER — ROCURONIUM BROMIDE 100 MG/10ML IV SOLN
INTRAVENOUS | Status: DC | PRN
  Administered 2013-01-24: 50 mg via INTRAVENOUS

## 2013-01-24 MED ORDER — METHOCARBAMOL 100 MG/ML IJ SOLN
500.0000 mg | Freq: Four times a day (QID) | INTRAVENOUS | Status: DC | PRN
  Filled 2013-01-24: qty 5

## 2013-01-24 MED ORDER — SODIUM CHLORIDE 0.9 % IJ SOLN: 3.0000 mL | Freq: Two times a day (BID) | INTRAMUSCULAR | Status: DC

## 2013-01-24 MED ORDER — ONDANSETRON HCL 4 MG/2ML IJ SOLN: 4.0000 mg | Freq: Four times a day (QID) | INTRAMUSCULAR | Status: DC | PRN

## 2013-01-24 MED ORDER — LACTATED RINGERS IV SOLN
INTRAVENOUS | Status: DC
  Administered 2013-01-24: 22:00:00 via INTRAVENOUS

## 2013-01-24 MED ORDER — TOPIRAMATE 25 MG PO TABS
50.0000 mg | ORAL_TABLET | Freq: Two times a day (BID) | ORAL | Status: DC
  Administered 2013-01-24 – 2013-01-25 (×2): 50 mg via ORAL
  Filled 2013-01-24 (×4): qty 2

## 2013-01-24 MED ORDER — SODIUM CHLORIDE 0.9 % IJ SOLN: 3.0000 mL | INTRAMUSCULAR | Status: DC | PRN

## 2013-01-24 MED ORDER — LINACLOTIDE 145 MCG PO CAPS
145.0000 ug | ORAL_CAPSULE | Freq: Every day | ORAL | Status: DC
  Administered 2013-01-25: 145 ug via ORAL
  Filled 2013-01-24 (×2): qty 1

## 2013-01-24 MED ORDER — ACETAMINOPHEN 10 MG/ML IV SOLN
1000.0000 mg | Freq: Four times a day (QID) | INTRAVENOUS | Status: DC
  Administered 2013-01-24: 1000 mg via INTRAVENOUS
  Filled 2013-01-24 (×5): qty 100

## 2013-01-24 SURGICAL SUPPLY — 58 items
BLADE SURG ROTATE 9660 (MISCELLANEOUS) IMPLANT
BUR EGG ELITE 4.0 (BURR) IMPLANT
BUR MATCHSTICK NEURO 3.0 LAGG (BURR) IMPLANT
CANISTER SUCTION 2500CC (MISCELLANEOUS) ×2 IMPLANT
CLOTH BEACON ORANGE TIMEOUT ST (SAFETY) IMPLANT
CLSR STERI-STRIP ANTIMIC 1/2X4 (GAUZE/BANDAGES/DRESSINGS) ×2 IMPLANT
CORDS BIPOLAR (ELECTRODE) ×2 IMPLANT
COVER SURGICAL LIGHT HANDLE (MISCELLANEOUS) ×2 IMPLANT
CRADLE DONUT ADULT HEAD (MISCELLANEOUS) ×2 IMPLANT
DEVICE ENDSKLTN TC MED 8MM (Orthopedic Implant) ×1 IMPLANT
DRAPE C-ARM 42X72 X-RAY (DRAPES) ×2 IMPLANT
DRAPE POUCH INSTRU U-SHP 10X18 (DRAPES) ×2 IMPLANT
DRAPE SURG 17X23 STRL (DRAPES) ×2 IMPLANT
DRAPE U-SHAPE 47X51 STRL (DRAPES) ×2 IMPLANT
DRSG MEPILEX BORDER 4X4 (GAUZE/BANDAGES/DRESSINGS) ×2 IMPLANT
DURAPREP 26ML APPLICATOR (WOUND CARE) IMPLANT
ELECT COATED BLADE 2.86 ST (ELECTRODE) ×2 IMPLANT
ELECT REM PT RETURN 9FT ADLT (ELECTROSURGICAL) ×2
ELECTRODE REM PT RTRN 9FT ADLT (ELECTROSURGICAL) ×1 IMPLANT
ENDOSKELTON TC IMPLANT 8MM MED (Orthopedic Implant) ×2 IMPLANT
GLOVE BIOGEL PI IND STRL 8 (GLOVE) IMPLANT
GLOVE BIOGEL PI IND STRL 8.5 (GLOVE) ×1 IMPLANT
GLOVE BIOGEL PI INDICATOR 8 (GLOVE)
GLOVE BIOGEL PI INDICATOR 8.5 (GLOVE) ×1
GLOVE ECLIPSE 8.5 STRL (GLOVE) ×2 IMPLANT
GLOVE ORTHO TXT STRL SZ7.5 (GLOVE) IMPLANT
GOWN PREVENTION PLUS XXLARGE (GOWN DISPOSABLE) ×2 IMPLANT
GOWN STRL REIN 2XL XLG LVL4 (GOWN DISPOSABLE) ×2 IMPLANT
GOWN STRL REIN XL XLG (GOWN DISPOSABLE) ×2 IMPLANT
KIT BASIN OR (CUSTOM PROCEDURE TRAY) ×2 IMPLANT
KIT ROOM TURNOVER OR (KITS) ×2 IMPLANT
NEEDLE SPNL 18GX3.5 QUINCKE PK (NEEDLE) ×2 IMPLANT
NS IRRIG 1000ML POUR BTL (IV SOLUTION) ×2 IMPLANT
PACK ORTHO CERVICAL (CUSTOM PROCEDURE TRAY) ×2 IMPLANT
PACK UNIVERSAL I (CUSTOM PROCEDURE TRAY) ×2 IMPLANT
PAD ARMBOARD 7.5X6 YLW CONV (MISCELLANEOUS) ×4 IMPLANT
PATTIES SURGICAL .25X.25 (GAUZE/BANDAGES/DRESSINGS) IMPLANT
PIN DISTRACTION 14 (PIN) ×4 IMPLANT
PLATE SKYLINE 12MM (Plate) ×2 IMPLANT
PUTTY BONE DBX 2.5 MIS (Bone Implant) ×2 IMPLANT
RESTRAINT LIMB HOLDER UNIV (RESTRAINTS) ×2 IMPLANT
SCREW SELF DRILL SKYLINE 12MM (Screw) ×4 IMPLANT
SCREW SKYLINE 14MM SD-VA (Screw) ×4 IMPLANT
SPONGE INTESTINAL PEANUT (DISPOSABLE) ×2 IMPLANT
SPONGE SURGIFOAM ABS GEL 100 (HEMOSTASIS) ×2 IMPLANT
SURGIFLO TRUKIT (HEMOSTASIS) IMPLANT
SUT MON AB 3-0 SH 27 (SUTURE) ×1
SUT MON AB 3-0 SH27 (SUTURE) ×1 IMPLANT
SUT SILK 2 0 (SUTURE)
SUT SILK 2-0 18XBRD TIE 12 (SUTURE) IMPLANT
SUT VIC AB 2-0 CT1 18 (SUTURE) ×2 IMPLANT
SYR BULB IRRIGATION 50ML (SYRINGE) ×2 IMPLANT
SYR CONTROL 10ML LL (SYRINGE) ×2 IMPLANT
TAPE CLOTH 4X10 WHT NS (GAUZE/BANDAGES/DRESSINGS) ×2 IMPLANT
TAPE UMBILICAL COTTON 1/8X30 (MISCELLANEOUS) ×2 IMPLANT
TOWEL OR 17X24 6PK STRL BLUE (TOWEL DISPOSABLE) ×2 IMPLANT
TOWEL OR 17X26 10 PK STRL BLUE (TOWEL DISPOSABLE) ×2 IMPLANT
WATER STERILE IRR 1000ML POUR (IV SOLUTION) ×2 IMPLANT

## 2013-01-24 NOTE — Brief Op Note (Signed)
01/24/2013  4:43 PM  PATIENT:  Courtney Trevino  52 y.o. female  PRE-OPERATIVE DIAGNOSIS:  C5-6 SPONDYLOSIS WITH C6 REDICULOPATHY  POST-OPERATIVE DIAGNOSIS:  c5-6 spondylosis with c6 rediculopathy  PROCEDURE:  Procedure(s): ANTERIOR CERVICAL DECOMPRESSION/DISCECTOMY FUSION 1 LEVEL C5-C6 (N/A)  SURGEON:  Surgeon(s) and Role:    * Venita Lick, MD - Primary  PHYSICIAN ASSISTANT:   ASSISTANTS: none   ANESTHESIA:   general  EBL:  Total I/O In: 900 [I.V.:900] Out: 50 [Blood:50]  BLOOD ADMINISTERED:none  DRAINS: none   LOCAL MEDICATIONS USED:  NONE  SPECIMEN:  No Specimen  DISPOSITION OF SPECIMEN:  N/A  COUNTS:  YES  TOURNIQUET:  * No tourniquets in log *  DICTATION: .Other Dictation: Dictation Number [id number]  PLAN OF CARE: Admit for overnight observation  PATIENT DISPOSITION:  PACU - hemodynamically stable.

## 2013-01-24 NOTE — Transfer of Care (Signed)
Immediate Anesthesia Transfer of Care Note  Patient: Courtney Trevino  Procedure(s) Performed: Procedure(s): ANTERIOR CERVICAL DECOMPRESSION/DISCECTOMY FUSION 1 LEVEL C5-C6 (N/A)  Patient Location: PACU  Anesthesia Type:General  Level of Consciousness: awake  Airway & Oxygen Therapy: Patient Spontanous Breathing and Patient connected to nasal cannula oxygen  Post-op Assessment: Report given to PACU RN, Post -op Vital signs reviewed and stable and Patient moving all extremities  Post vital signs: Reviewed and stable  Complications: No apparent anesthesia complications

## 2013-01-24 NOTE — Anesthesia Preprocedure Evaluation (Signed)
Anesthesia Evaluation  Patient identified by MRN, date of birth, ID band Patient awake    Reviewed: Allergy & Precautions, H&P , NPO status , Patient's Chart, lab work & pertinent test results  Airway Mallampati: II  Neck ROM: full    Dental   Pulmonary sleep apnea , COPDCurrent Smoker,          Cardiovascular hypertension, + Peripheral Vascular Disease     Neuro/Psych  Headaches, Seizures -,  Anxiety Depression  Neuromuscular disease CVA    GI/Hepatic GERD-  ,  Endo/Other  diabetes, Type 2obese  Renal/GU Renal InsufficiencyRenal disease     Musculoskeletal  (+) Fibromyalgia -  Abdominal   Peds  Hematology   Anesthesia Other Findings   Reproductive/Obstetrics                           Anesthesia Physical Anesthesia Plan  ASA: III  Anesthesia Plan: General   Post-op Pain Management:    Induction: Intravenous  Airway Management Planned: Oral ETT  Additional Equipment:   Intra-op Plan:   Post-operative Plan: Extubation in OR  Informed Consent: I have reviewed the patients History and Physical, chart, labs and discussed the procedure including the risks, benefits and alternatives for the proposed anesthesia with the patient or authorized representative who has indicated his/her understanding and acceptance.     Plan Discussed with: CRNA, Anesthesiologist and Surgeon  Anesthesia Plan Comments:         Anesthesia Quick Evaluation

## 2013-01-24 NOTE — H&P (Signed)
No change in H+P Clinical exam reviewed

## 2013-01-24 NOTE — Progress Notes (Signed)
Notified Dr. Shon Baton that pt. Stated her prescription for mupirocin stated to use every 4 hours for 1 day, which she did. Dr. Shon Baton said that was acceptable.  Also, informed him pt. Stated she slipped on a wet floor Saturday. Stated she fell into the wall and hurt her left shoulder. Sunday she went to [facility], stated x-rays were taken of her shoulder and elbow. States nothing was broken.

## 2013-01-24 NOTE — Preoperative (Signed)
Beta Blockers   Reason not to administer Beta Blockers:Not Applicable 

## 2013-01-25 ENCOUNTER — Encounter (HOSPITAL_COMMUNITY): Payer: Self-pay | Admitting: Emergency Medicine

## 2013-01-25 LAB — GLUCOSE, CAPILLARY
Glucose-Capillary: 91 mg/dL (ref 70–99)
Glucose-Capillary: 100 mg/dL — ABNORMAL HIGH (ref 70–99)

## 2013-01-25 MED ORDER — OXYCODONE-ACETAMINOPHEN 7.5-325 MG PO TABS: 1.0000 | ORAL_TABLET | Freq: Four times a day (QID) | ORAL | Status: DC | PRN

## 2013-01-25 MED ORDER — POLYETHYLENE GLYCOL 3350 17 G PO PACK: 17.0000 g | PACK | Freq: Every day | ORAL | Status: DC

## 2013-01-25 MED ORDER — HYDROMORPHONE HCL PF 1 MG/ML IJ SOLN
0.5000 mg | INTRAMUSCULAR | Status: DC | PRN
  Administered 2013-01-25 (×3): 1 mg via INTRAVENOUS
  Filled 2013-01-25 (×3): qty 1

## 2013-01-25 MED ORDER — OXYCODONE HCL 5 MG PO TABS
5.0000 mg | ORAL_TABLET | ORAL | Status: DC | PRN
  Administered 2013-01-25 (×2): 10 mg via ORAL
  Filled 2013-01-25 (×2): qty 2

## 2013-01-25 MED ORDER — DOCUSATE SODIUM 100 MG PO CAPS: 100.0000 mg | ORAL_CAPSULE | Freq: Two times a day (BID) | ORAL | Status: DC

## 2013-01-25 NOTE — Anesthesia Postprocedure Evaluation (Signed)
  Anesthesia Post-op Note  Patient: Courtney Trevino  Procedure(s) Performed: Procedure(s): ANTERIOR CERVICAL DECOMPRESSION/DISCECTOMY FUSION 1 LEVEL C5-C6 (N/A)  Patient Location: PACU  Anesthesia Type:General  Level of Consciousness: awake  Airway and Oxygen Therapy: Patient Spontanous Breathing  Post-op Pain: mild  Post-op Assessment: Post-op Vital signs reviewed  Post-op Vital Signs: stable  Complications: No apparent anesthesia complications
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
## 2013-01-25 NOTE — Discharge Summary (Signed)
Agree with above 

## 2013-01-25 NOTE — Progress Notes (Signed)
Subjective: Doing well this morning.  Pain controlled.     Objective: Vital signs in last 24 hours: Temp:  [98 F (36.7 C)-99.2 F (37.3 C)] 98.4 F (36.9 C) (12/12 0548) Pulse Rate:  [74-98] 74 (12/12 0548) Resp:  [11-17] 16 (12/12 0548) BP: (108-137)/(67-80) 109/70 mmHg (12/12 0548) SpO2:  [98 %-100 %] 99 % (12/12 0548)  Intake/Output from previous day: 12/11 0701 - 12/12 0700 In: 2094.2 [P.O.:320; I.V.:1624.2; IV Piggyback:150] Out: 50 [Blood:50] Intake/Output this shift: Total I/O In: 120 [P.O.:120] Out: -   No results found for this basename: HGB,  in the last 72 hours No results found for this basename: WBC, RBC, HCT, PLT,  in the last 72 hours No results found for this basename: NA, K, CL, CO2, BUN, CREATININE, GLUCOSE, CALCIUM,  in the last 72 hours No results found for this basename: LABPT, INR,  in the last 72 hours  Neurologically intact  Assessment/Plan: Transfer to [facility] today.  F/u in office 2 weeks postop.     Courtney Trevino M 01/25/2013, 11:01 AM

## 2013-01-25 NOTE — Clinical Social Work Psychosocial (Signed)
Clinical Social Work Department  BRIEF PSYCHOSOCIAL ASSESSMENT  Patient:Lorayne Lauf Account Number: [id number]  Admit date: 01/24/13 Clinical Social Worker Sabino Niemann, MSW Date/Time: 01/25/2013  9:00 AM Referred by: Physician Date Referred: 01/25/2013 Referred for   SNF Placement   Other Referral:  Interview type: Patient and patient's daughter  Other interview type: PSYCHOSOCIAL DATA  Living Status: Family Admitted from facility:  Level of care:  Primary support name: Thomas,Dorothy  Primary support relationship to patient: Aunt Degree of support available:  Strong and vested  CURRENT CONCERNS  Current Concerns   Post-Acute Placement   Other Concerns:  SOCIAL WORK ASSESSMENT / PLAN  CSW met with pt re: PT recommendation for SNF.   Pt lives with family  CSW explained placement process and answered questions.   Pt reports [facility]  as her preference    CSW completed FL2 and initiated SNF search.     Assessment/plan status: Information/Referral to [facility]  Other assessment/ plan:  Information/referral to community resources:  SNF     PATIENT'S/FAMILY'S RESPONSE TO PLAN OF CARE:  Pt  reports she is agreeable to ST SNF in order to increase strength and independence with mobility prior to returning home  Pt verbalized understanding of placement process and appreciation for CSW assist.   Sabino Niemann, MSW,LCSWA [phone]

## 2013-01-25 NOTE — Progress Notes (Signed)
Utilization review completed.  

## 2013-01-25 NOTE — Discharge Summary (Signed)
Physician Discharge Summary  Patient ID: Courtney Trevino MRN: [id number] DOB/AGE: 50-Jan-1962 50 y.o.  Admit date: 01/24/2013 Discharge date: 01/25/2013  Admission Diagnoses: C5-6 HNP  Discharge Diagnoses:  S/p C5-6 ACDF  Discharged Condition: stable  Hospital Course: 52 yo bf with hx of C5-6 hnp, neck pain and ue radiculopathy presented to the hospital 24 Jan 2013 for sched fusion.  Tolerated surgery well and without complication.  12 dec, doing well.  Pain controlled.  Stable and ready for transfer to [facility] place for rehab.    Consults: None    Discharge Exam: Blood pressure 109/70, pulse 74, temperature 98.4 F (36.9 C), temperature source Oral, resp. rate 16, SpO2 99.00%. Alert and oriented.  Neurologically intact.  No respiratory issues.    Disposition: snf  Discharge Orders   Future Orders Complete By Expires   Call MD / Call 911  As directed    Comments:     If you experience chest pain or shortness of breath, CALL 911 and be transported to the hospital emergency room.  If you develope a fever above 101 F, pus (white drainage) or increased drainage or redness at the wound, or calf pain, call your surgeon's office.   Constipation Prevention  As directed    Comments:     Drink plenty of fluids.  Prune juice may be helpful.  You may use a stool softener, such as Colace (over the counter) 100 mg twice a day.  Use MiraLax (over the counter) for constipation as needed.   Diet - low sodium heart healthy  As directed    Discharge instructions  As directed    Comments:     Ok to shower 5 days postop.  Do not apply any creams or ointments to incision.  Do not remove steri-strips.  Can use 4x4 gauze and tape for dressing changes.  No aggressive activity. Cervical collar must be on at all times except when showering.  Do not bend or turn neck.  Daily wound checks and dressing changes.   Driving restrictions  As directed    Comments:     No driving until further notice.   Increase  activity slowly as tolerated  As directed    Lifting restrictions  As directed    Comments:     No lifting until further notice.       Medication List    STOP taking these medications       azithromycin 250 MG tablet  Commonly known as:  ZITHROMAX      TAKE these medications       albuterol 108 (90 BASE) MCG/ACT inhaler  Commonly known as:  PROVENTIL HFA;VENTOLIN HFA  Inhale 1 puff into the lungs every 6 (six) hours as needed for wheezing or shortness of breath.     cholecalciferol 1000 UNITS tablet  Commonly known as:  VITAMIN D  Take 1,000 Units by mouth daily.     clonazePAM 1 MG tablet  Commonly known as:  KLONOPIN  Take 1 mg by mouth 2 (two) times daily.     dexlansoprazole 60 MG capsule  Commonly known as:  DEXILANT  Take 60 mg by mouth daily.     diphenhydrAMINE 25 MG tablet  Commonly known as:  BENADRYL  Take 25 mg by mouth every 6 (six) hours as needed for itching or allergies (allergic reactions).     docusate sodium 100 MG capsule  Commonly known as:  COLACE  Take 1 capsule (100 mg total) by mouth 2 (  two) times daily.     EPINEPHrine 0.3 mg/0.3 mL Soaj injection  Commonly known as:  EPI-PEN  Inject 0.3 mg into the muscle once as needed (allergic reaction).     fluticasone 50 MCG/ACT nasal spray  Commonly known as:  FLONASE  Place 1 spray into both nostrils 2 (two) times daily.     Fluticasone-Salmeterol 100-50 MCG/DOSE Aepb  Commonly known as:  ADVAIR  Inhale 1 puff into the lungs every 12 (twelve) hours.     hydrOXYzine 50 MG capsule  Commonly known as:  VISTARIL  Take 50 mg by mouth 2 (two) times daily as needed for anxiety.     LINZESS 145 MCG Caps capsule  Generic drug:  Linaclotide  Take 145 mcg by mouth daily. 30 minutes before eating     loratadine 10 MG tablet  Commonly known as:  CLARITIN  Take 10 mg by mouth daily.     montelukast 10 MG tablet  Commonly known as:  SINGULAIR  Take 10 mg by mouth at bedtime.      oxyCODONE-acetaminophen 7.5-325 MG per tablet  Commonly known as:  PERCOCET  Take 1-2 tablets by mouth every 6 (six) hours as needed for pain.     PARoxetine 20 MG tablet  Commonly known as:  PAXIL  Take 20 mg by mouth daily.     pentosan polysulfate 100 MG capsule  Commonly known as:  ELMIRON  Take 100 mg by mouth 3 (three) times daily before meals.     polyethylene glycol packet  Commonly known as:  MIRALAX / GLYCOLAX  Take 17 g by mouth daily.     potassium chloride SA 20 MEQ tablet  Commonly known as:  K-DUR,KLOR-CON  Take 20 mEq by mouth 2 (two) times daily.     promethazine 25 MG tablet  Commonly known as:  PHENERGAN  Take 25 mg by mouth 3 (three) times daily as needed for nausea.     tiZANidine 4 MG tablet  Commonly known as:  ZANAFLEX  Take 4 mg by mouth 2 (two) times daily.     topiramate 50 MG tablet  Commonly known as:  TOPAMAX  Take 50 mg by mouth 2 (two) times daily.     traZODone 150 MG tablet  Commonly known as:  DESYREL  Take 150-300 mg by mouth at bedtime.     triamterene-hydrochlorothiazide 37.5-25 MG per tablet  Commonly known as:  MAXZIDE-25  Take 1 tablet by mouth daily.           Follow-up Information   Schedule an appointment as soon as possible for a visit with Alvy Beal, MD. (need return office visit 2 weeks postop.  sooner if needed.  )    Specialty:  Orthopedic Surgery   Contact information:   [address] [place] [address] [phone]       Signed: Naida Sleight 01/25/2013, 11:03 AM

## 2013-01-25 NOTE — Op Note (Signed)
Courtney Trevino, BAUCH NO.:  [id number]  MEDICAL RECORD NO.:  [id number]  LOCATION:  5N06C                        FACILITY:  [facility]  PHYSICIAN:  Alvy Beal, MD    DATE OF BIRTH:  21-Feb-1960  DATE OF PROCEDURE:  01/24/2013 DATE OF DISCHARGE:                              OPERATIVE REPORT   PREOPERATIVE DIAGNOSIS:  Cervical spondylitic radiculopathy.  POSTOPERATIVE DIAGNOSIS:  Cervical spondylitic radiculopathy.  OPERATIVE PROCEDURE:  Anterior cervical diskectomy and fusion.  INSTRUMENTATION USED:  Titan titanium intervertebral cage, medium size 8 lordotic with a 12 mm anterior cervical Synthes DePuy plate.  COMPLICATIONS:  None.  CONDITION:  Stable.  HISTORY:  This is a very pleasant woman who has been having significant neck and radicular right arm pain in the C6 distribution.  The only temporary relief she had is with a C6 selective nerve root block. Because of the persistent pain and the findings of a disk osteophyte compressing the exiting C6 nerve on the right side corresponding with her clinical exam, we elected to proceed with surgery.  All appropriate risks, benefits, and alternatives were discussed.  Consent was obtained.  OPERATIVE NOTE:  The patient was brought to the operating room, placed supine on the operating table.  After successful induction of general anesthesia and endotracheal intubation, TEDs, SCDs were applied.  Rolled towels were placed between shoulder blades.  The anterior cervical spine was prepped and draped in a standard fashion.  A time-out was taken to confirm patient procedure and all other pertinent important data.  Once this was done, I then made a transverse incision centered over the C5-6 disk space.  Sharp dissection was carried out down to the platysma.  The platysma was sharply incised using a standard Smith-Robinson approach. I dissected along the medial border sternocleidomastoid down to the deep cervical and  prevertebral fascia.  I swept the trachea and esophagus to the right and protected with a thyroid retractor, identified and protected the carotid arteries.  The carotid sheath laterally with a finger.  I now used Pension scheme manager to mobilize the remaining prevertebral fascia to completely expose the anterior longitudinal ligament.  Needle was placed into the C5-6 disk space, and I took an intraoperative x-ray.  The tech, myself, and the circulating nurse all counted to confirm that we were at the C5-6 level.  Once this was confirmed, I marked the level and then mobilized the longus coli muscles bilaterally out to the level of the uncovertebral joint using bipolar electrocautery.  Self-retaining Caspar retractor blades were placed underneath the longus colli muscle.  The retractor was expanded, the endotracheal cuff was reinflated.  I then placed distraction pins into the bodies of C5 and C6 and gently distracted the space.  An annulotomy was performed with a 15 blade scalpel.  I removed the bulk of the disk with pituitary rongeurs.  I then removed the inferior overhanging osteophytes from the C5 vertebral body with a 2 and 3 mm Kerrison.  I then distracted the intervertebral space somewhat more and then continued working posteriorly.  I removed all of the disk material and identified the posterior anulus.  Using a fine nerve hook, I released the posterior anulus  and developed a plane between the posterior longitudinal ligament and the thecal sac.  Using 1 mm Kerrison, I removed the posterior longitudinal ligament as well as the annulus.  I could then identify small fragment hard disk osteophyte in the posterolateral corner on the right side.  This was mobilized using a nerve hook and removed.  At this point, I then carried my dissection out to the uncovertebral joint.  I was very pleased with the decompression. I removed the offending agent.  I then rasped the endplates to ensure I  had bleeding subchondral bone, and trialed the 7 and 8 mm cages.  I elected to use the 8 medium cage. This was obtained packed with DBX mix and malleted to the appropriate depth.  Once at the depth, I then placed an anterior cervical plate and secured it with self-drilling screws into the bodies of C5 and C6.  I then removed all of the remaining retracting devices, irrigated the wound copiously with normal saline.  I Obtained hemostasis using bipolar electrocautery.  I then returned the trachea and esophagus back to midline.  I then closed the platysma with interrupted 2-0 Vicryl sutures and skin with 3-0 Monocryl.  Steri-Strips and dry dressing were applied as was an Biochemist, clinical.  The patient was extubated, transferred to the PACU without incident.  At the end of the case, needle and sponge counts were correct.  There was no adverse intraoperative events.     Alvy Beal, MD     DDB/MEDQ  D:  01/24/2013  T:  01/25/2013  Job:  [id number]

## 2013-01-25 NOTE — Clinical Social Work Placement (Signed)
Clinical Social Work Department  CLINICAL SOCIAL WORK PLACEMENT NOTE   Patient: Courtney Trevino  Account Number: [id number]  Admit date: 01/24/13 Clinical Social Worker: Sabino Niemann LCSWA Date/time: 01/25/2013 10:30 AM  Clinical Social Work is seeking post-discharge placement for this patient at the following level of care: SKILLED NURSING (*CSW will update this form in Epic as items are completed)  12/12/2014Patient/family provided with [facility] Department of Clinical Social Work's list of facilities offering this level of care within the geographic area requested by the patient (or if unable, by the patient's family).  01/25/2013 Patient/family informed of their freedom to choose among providers that offer the needed level of care, that participate in Medicare, Medicaid or managed care program needed by the patient, have an available bed and are willing to accept the patient.  01/25/2013 Patient/family informed of [facility]' ownership interest in [facility], as well as of the fact that they are under no obligation to receive care at this facility.  PASARR submitted to EDS on 01/25/2013 PASARR number received from EDS on 01/25/2013 FL2 transmitted to all facilities in geographic area requested by pt/family on 01/25/2013 FL2 transmitted to all facilities within larger geographic area on  Patient informed that his/her managed care company has contracts with or will negotiate with certain facilities, including the following:  Patient/family informed of bed offers received: 01/25/2013 Patient chooses bed at  [facility] Physician recommends and patient chooses bed at  Patient to be transferred to on 01/25/2013 Patient to be transferred to facility by [facility] The following physician request were entered in Epic:  Additional Comments:   Clinical Social Worker will sign off for now as social work intervention is no longer needed. Please consult [place] again if new need  arises.

## 2013-01-25 NOTE — Care Management Note (Signed)
CARE MANAGEMENT NOTE 01/25/2013  Patient:  Courtney Trevino, Courtney Trevino   Account Number:  [id number]  Date Initiated:  01/25/2013  Documentation initiated by:  Vance Peper  Subjective/Objective Assessment:   52 yr old female s/p ACDF C5-6     Action/Plan:   patient will require shortterm rehab at [facility]. Patient requesting [facility] Social worker is aware.   Anticipated DC Date:  01/25/2013   Anticipated DC Plan:  SKILLED NURSING FACILITY  In-house referral  Clinical Social Worker      DC Planning Services  CM consult      Choice offered to / List presented to:             Status of service:  Completed, signed off Medicare Important Message given?   (If response is "NO", the following Medicare IM given date fields will be blank) Date Medicare IM given:   Date Additional Medicare IM given:    Discharge Disposition:  SKILLED NURSING FACILITY

## 2013-01-28 ENCOUNTER — Non-Acute Institutional Stay (SKILLED_NURSING_FACILITY): Payer: PRIVATE HEALTH INSURANCE | Admitting: Adult Health

## 2013-01-28 DIAGNOSIS — I10 Essential (primary) hypertension: Secondary | ICD-10-CM

## 2013-01-28 DIAGNOSIS — K219 Gastro-esophageal reflux disease without esophagitis: Secondary | ICD-10-CM

## 2013-01-28 DIAGNOSIS — F329 Major depressive disorder, single episode, unspecified: Secondary | ICD-10-CM

## 2013-01-28 DIAGNOSIS — M5000 Cervical disc disorder with myelopathy, unspecified cervical region: Secondary | ICD-10-CM

## 2013-01-28 DIAGNOSIS — F411 Generalized anxiety disorder: Secondary | ICD-10-CM

## 2013-01-28 DIAGNOSIS — K589 Irritable bowel syndrome without diarrhea: Secondary | ICD-10-CM

## 2013-01-28 DIAGNOSIS — G4700 Insomnia, unspecified: Secondary | ICD-10-CM

## 2013-01-28 DIAGNOSIS — F419 Anxiety disorder, unspecified: Secondary | ICD-10-CM

## 2013-01-28 DIAGNOSIS — J449 Chronic obstructive pulmonary disease, unspecified: Secondary | ICD-10-CM

## 2013-01-30 ENCOUNTER — Non-Acute Institutional Stay (SKILLED_NURSING_FACILITY): Payer: PRIVATE HEALTH INSURANCE | Admitting: Internal Medicine

## 2013-01-30 DIAGNOSIS — M542 Cervicalgia: Secondary | ICD-10-CM

## 2013-01-30 DIAGNOSIS — J449 Chronic obstructive pulmonary disease, unspecified: Secondary | ICD-10-CM

## 2013-01-30 DIAGNOSIS — M5000 Cervical disc disorder with myelopathy, unspecified cervical region: Secondary | ICD-10-CM

## 2013-01-30 DIAGNOSIS — R569 Unspecified convulsions: Secondary | ICD-10-CM

## 2013-01-31 ENCOUNTER — Other Ambulatory Visit: Payer: Self-pay | Admitting: *Deleted

## 2013-01-31 MED ORDER — OXYCODONE HCL ER 10 MG PO T12A: EXTENDED_RELEASE_TABLET | ORAL | Status: DC

## 2013-02-03 ENCOUNTER — Encounter: Payer: Self-pay | Admitting: Internal Medicine

## 2013-02-03 DIAGNOSIS — M5000 Cervical disc disorder with myelopathy, unspecified cervical region: Secondary | ICD-10-CM | POA: Insufficient documentation

## 2013-02-03 DIAGNOSIS — R569 Unspecified convulsions: Secondary | ICD-10-CM | POA: Insufficient documentation

## 2013-02-03 DIAGNOSIS — J4489 Other specified chronic obstructive pulmonary disease: Secondary | ICD-10-CM | POA: Insufficient documentation

## 2013-02-03 DIAGNOSIS — J449 Chronic obstructive pulmonary disease, unspecified: Secondary | ICD-10-CM | POA: Insufficient documentation
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
## 2013-02-11 ENCOUNTER — Other Ambulatory Visit: Payer: Self-pay | Admitting: *Deleted

## 2013-02-11 MED ORDER — OXYCODONE HCL 5 MG PO TABS: ORAL_TABLET | ORAL | Status: DC

## 2013-02-25 ENCOUNTER — Other Ambulatory Visit: Payer: Self-pay | Admitting: *Deleted

## 2013-02-25 MED ORDER — CLONAZEPAM 1 MG PO TABS: ORAL_TABLET | ORAL | Status: AC

## 2013-02-26 ENCOUNTER — Non-Acute Institutional Stay (SKILLED_NURSING_FACILITY): Payer: PRIVATE HEALTH INSURANCE | Admitting: Adult Health

## 2013-02-26 ENCOUNTER — Other Ambulatory Visit: Payer: Self-pay | Admitting: *Deleted

## 2013-02-26 DIAGNOSIS — K589 Irritable bowel syndrome without diarrhea: Secondary | ICD-10-CM

## 2013-02-26 DIAGNOSIS — F329 Major depressive disorder, single episode, unspecified: Secondary | ICD-10-CM

## 2013-02-26 DIAGNOSIS — F411 Generalized anxiety disorder: Secondary | ICD-10-CM

## 2013-02-26 DIAGNOSIS — G4700 Insomnia, unspecified: Secondary | ICD-10-CM

## 2013-02-26 DIAGNOSIS — J449 Chronic obstructive pulmonary disease, unspecified: Secondary | ICD-10-CM | POA: Insufficient documentation

## 2013-02-26 DIAGNOSIS — F419 Anxiety disorder, unspecified: Secondary | ICD-10-CM | POA: Insufficient documentation

## 2013-02-26 DIAGNOSIS — K219 Gastro-esophageal reflux disease without esophagitis: Secondary | ICD-10-CM | POA: Insufficient documentation

## 2013-02-26 DIAGNOSIS — F32A Depression, unspecified: Secondary | ICD-10-CM

## 2013-02-26 DIAGNOSIS — I10 Essential (primary) hypertension: Secondary | ICD-10-CM

## 2013-02-26 DIAGNOSIS — F3289 Other specified depressive episodes: Secondary | ICD-10-CM

## 2013-02-26 DIAGNOSIS — M5000 Cervical disc disorder with myelopathy, unspecified cervical region: Secondary | ICD-10-CM

## 2013-02-26 MED ORDER — OXYCODONE HCL 5 MG PO TABS: ORAL_TABLET | ORAL | Status: DC

## 2013-02-26 MED ORDER — OXYCODONE HCL 5 MG PO TABS: ORAL_TABLET | ORAL | Status: AC

## 2013-02-26 NOTE — Progress Notes (Signed)
Patient ID: Courtney PeakGermaine Trevino, female   DOB: January 30, 1961, 53 y.o.   MRN: [id number]              PROGRESS NOTE  DATE: 02/26/2013   FACILITY: [name]  LEVEL OF CARE: SNF (31)  Acute Visit  CHIEF COMPLAINT:  Discharge Notes  HISTORY OF PRESENT ILLNESS: This is a 53 year old female who is for discharge home with Home health PT. [facility] with C5-6 HNP S/P C5-6 ACDF.  Patient was admitted to this facility for short-term rehabilitation after the patient's recent hospitalization.  Patient has completed SNF rehabilitation and therapy has cleared the patient for discharge.  Reassessment of ongoing problem(s):  HTN: Pt 's HTN remains stable.  Denies CP, sob, DOE, pedal edema, headaches, dizziness or visual disturbances.  No complications from the medications currently being used.  Last BP :114/74  ANXIETY: The anxiety remains stable. Patient denies ongoing anxiety or irritability. No complications reported from the medications currently being used.  COPD: the COPD remains stable.  Pt denies sob, cough, wheezing or declining exercise tolerance.  No complications from the medications presently being used.  PAST MEDICAL HISTORY : Reviewed.  No changes.  [facility]  REVIEW OF SYSTEMS:  GENERAL: no change in appetite, no fatigue, no weight changes, no fever, chills or weakness RESPIRATORY: no cough, SOB, DOE, wheezing, hemoptysis CARDIAC: no chest pain, edema or palpitations GI: no abdominal pain, diarrhea, constipation, heart burn, nausea or vomiting  PHYSICAL EXAMINATION  VS:  T98.3    P87       RR20      BP114/74      POX96 %       WT199.8 (Lb)  GENERAL: no acute distress, normal body habitus NECK: supple, trachea midline, no neck masses, no thyroid tenderness, no thyromegaly LYMPHATICS: no LAN in the neck, no supraclavicular LAN RESPIRATORY: breathing is even & unlabored, BS  CTAB CARDIAC: RRR, no murmur,no extra heart sounds, no edema GI: abdomen soft, normal BS, no masses, no tenderness, no hepatomegaly, no splenomegaly PSYCHIATRIC: the patient is alert & oriented to person, affect & behavior appropriate  LABS/RADIOLOGY: 01/18/13 sodium 138 potassium 3.3 glucose 85 BUN 21 creatinine 1.46 calcium 8.8 WBC 5.0 hemoglobin 11.3 hematocrit 33.4 02/06/13 sodium 139 potassium 3.9 glucose 102 BUN 22 creatinine 1.2 calcium 9.0 WBC 4.3 hemoglobin 10.8 hematocrit 33.6 hemoglobin A1c 5.5 01/29/13 sodium 137 potassium 3.9 glucose 96 BUN 17 creatinine 1.2 calcium 9.6 WBC 4.9 hemoglobin 11.1 hematocrit 34.4  ASSESSMENT/PLAN:  C5-6 HNP S/P C5-6 ACDF - for Home health PT  Irritable bowel syndrome - continued Linzess and Elmiron  GERD - stable; continue Prilosec  Depression - continue Paxil  Hypertension - well controlled; continue Maxzide  Anxiety - continue Klonopin  COPD - stable; continue Advair  Insomnia - no complaints; continue Desyrel    I have filled out patient's discharge paperwork and written prescriptions.  Patient will receive home health PT.   Total discharge time: Less than 30 minutes Discharge time involved coordination of the discharge process with Child psychotherapistsocial worker, nursing staff and therapy department. Medical justification for home health services verified.  CPT CODE: [id number]

## 2013-02-26 NOTE — Progress Notes (Signed)
Patient ID: Courtney PeakGermaine Trevino, female   DOB: 13-Aug-1960, 53 y.o.   MRN: [id number]               PROGRESS NOTE  DATE: 01/28/2013  FACILITY: Nursing Home Location: [name]  LEVEL OF CARE: SNF (31)  Acute Visit  CHIEF COMPLAINT:  Follow-up hospitalization  HISTORY OF PRESENT ILLNESS:This is a 53 year old female who [facility] with C5-6 HNP S/P C5-6 ACDF. She has been admitted for a short-term rehabilitation.  REASSESSMENT OF ONGOING PROBLEM(S):  HTN: Pt 's HTN remains stable.  Denies CP, sob, DOE, pedal edema, headaches, dizziness or visual disturbances.  No complications from the medications currently being used.  Last BP :110/75  GERD: pt's GERD is stable.  Denies ongoing heartburn, abd. Pain, nausea or vomiting.  Currently on a PPI & tolerates it without any adverse reactions.  COPD: the COPD remains stable.  Pt denies sob, cough, wheezing or declining exercise tolerance.  No complications from the medications presently being used. PAST MEDICAL HISTORY : Reviewed.  No changes.  [facility]  REVIEW OF SYSTEMS:  GENERAL: no change in appetite, no fatigue, no weight changes, no fever, chills or weakness RESPIRATORY: no cough, SOB, DOE, wheezing, hemoptysis CARDIAC: no chest pain, edema or palpitations GI: no abdominal pain, diarrhea, constipation, heart burn, nausea or vomiting  PHYSICAL EXAMINATION  VS:  T98.7       P87      RR20      BP110/75     POX96 %     WT205 (Lb)  GENERAL: no acute distress, normal body habitus EYES: conjunctivae normal, sclerae normal, normal eye lids NECK: supple, trachea midline, no neck masses, no thyroid tenderness, no thyromegaly Cervical incision is dry with intact steri-strips; +neck collar LYMPHATICS: no LAN in the neck, no supraclavicular LAN RESPIRATORY: breathing is even & unlabored, BS CTAB CARDIAC: RRR, no murmur,no extra heart  sounds, no edema GI: abdomen soft, normal BS, no masses, no tenderness, no hepatomegaly, no splenomegaly PSYCHIATRIC: the patient is alert & oriented to person, affect & behavior appropriate  LABS/RADIOLOGY: 01/18/13 sodium 138 potassium 3.3 glucose 85 BUN 21 creatinine 1.46 calcium 8.8 WBC 5.0 hemoglobin 11.3 hematocrit 33.4  ASSESSMENT/PLAN:  C5-6 HNP S/P C5-6 ACDF - for rehabilitation  Irritable bowel syndrome - continued Linzess and Elmiron  GERD - stable; continue Dexilant  Depression - continue Paxil  Hypertension - well controlled; continue Maxzide  Anxiety - continue Klonopin  COPD - stable; continue Advair  Insomnia - no complaints; continue Desyrel     CPT CODE: [id number]

## 2013-05-28 ENCOUNTER — Encounter (HOSPITAL_COMMUNITY): Payer: Self-pay | Admitting: Emergency Medicine

## 2013-05-28 ENCOUNTER — Emergency Department (HOSPITAL_COMMUNITY)
Admission: EM | Admit: 2013-05-28 | Discharge: 2013-05-28 | Disposition: A | Discharge status: Home / Self Care | Payer: Medicare Other | Attending: Emergency Medicine | Admitting: Emergency Medicine

## 2013-05-28 DIAGNOSIS — G988 Other disorders of nervous system: Secondary | ICD-10-CM | POA: Insufficient documentation

## 2013-05-28 DIAGNOSIS — F17200 Nicotine dependence, unspecified, uncomplicated: Secondary | ICD-10-CM | POA: Insufficient documentation

## 2013-05-28 DIAGNOSIS — K219 Gastro-esophageal reflux disease without esophagitis: Secondary | ICD-10-CM | POA: Insufficient documentation

## 2013-05-28 DIAGNOSIS — E119 Type 2 diabetes mellitus without complications: Secondary | ICD-10-CM | POA: Insufficient documentation

## 2013-05-28 DIAGNOSIS — G40909 Epilepsy, unspecified, not intractable, without status epilepticus: Secondary | ICD-10-CM | POA: Insufficient documentation

## 2013-05-28 DIAGNOSIS — J449 Chronic obstructive pulmonary disease, unspecified: Secondary | ICD-10-CM | POA: Insufficient documentation

## 2013-05-28 DIAGNOSIS — F3289 Other specified depressive episodes: Secondary | ICD-10-CM | POA: Insufficient documentation

## 2013-05-28 DIAGNOSIS — Z792 Long term (current) use of antibiotics: Secondary | ICD-10-CM | POA: Insufficient documentation

## 2013-05-28 DIAGNOSIS — Z79899 Other long term (current) drug therapy: Secondary | ICD-10-CM | POA: Insufficient documentation

## 2013-05-28 DIAGNOSIS — Z880 Allergy status to penicillin: Secondary | ICD-10-CM | POA: Insufficient documentation

## 2013-05-28 DIAGNOSIS — R2990 Unspecified symptoms and signs involving the nervous system: Secondary | ICD-10-CM

## 2013-05-28 DIAGNOSIS — R011 Cardiac murmur, unspecified: Secondary | ICD-10-CM | POA: Insufficient documentation

## 2013-05-28 DIAGNOSIS — J4489 Other specified chronic obstructive pulmonary disease: Secondary | ICD-10-CM | POA: Insufficient documentation

## 2013-05-28 DIAGNOSIS — IMO0001 Reserved for inherently not codable concepts without codable children: Secondary | ICD-10-CM | POA: Insufficient documentation

## 2013-05-28 DIAGNOSIS — G43909 Migraine, unspecified, not intractable, without status migrainosus: Secondary | ICD-10-CM | POA: Insufficient documentation

## 2013-05-28 DIAGNOSIS — I129 Hypertensive chronic kidney disease with stage 1 through stage 4 chronic kidney disease, or unspecified chronic kidney disease: Secondary | ICD-10-CM | POA: Insufficient documentation

## 2013-05-28 DIAGNOSIS — Z8701 Personal history of pneumonia (recurrent): Secondary | ICD-10-CM | POA: Insufficient documentation

## 2013-05-28 DIAGNOSIS — F411 Generalized anxiety disorder: Secondary | ICD-10-CM | POA: Insufficient documentation

## 2013-05-28 DIAGNOSIS — F329 Major depressive disorder, single episode, unspecified: Secondary | ICD-10-CM | POA: Insufficient documentation

## 2013-05-28 DIAGNOSIS — IMO0002 Reserved for concepts with insufficient information to code with codable children: Secondary | ICD-10-CM | POA: Insufficient documentation

## 2013-05-28 DIAGNOSIS — Z8673 Personal history of transient ischemic attack (TIA), and cerebral infarction without residual deficits: Secondary | ICD-10-CM | POA: Insufficient documentation

## 2013-05-28 DIAGNOSIS — Z862 Personal history of diseases of the blood and blood-forming organs and certain disorders involving the immune mechanism: Secondary | ICD-10-CM | POA: Insufficient documentation

## 2013-05-28 DIAGNOSIS — N184 Chronic kidney disease, stage 4 (severe): Secondary | ICD-10-CM | POA: Insufficient documentation

## 2013-05-28 LAB — CBG MONITORING, ED: Glucose-Capillary: 91 mg/dL (ref 70–99)

## 2013-05-28 NOTE — ED Notes (Signed)
CBG 91 

## 2013-05-28 NOTE — Discharge Instructions (Signed)
Weakness  Weakness is a lack of strength. It may be felt all over the body (generalized) or in one specific part of the body (focal). Some causes of weakness can be serious. You may need further medical evaluation, especially if you are elderly or you have a history of immunosuppression (such as chemotherapy or HIV), kidney disease, heart disease, or diabetes.  CAUSES   Weakness can be caused by many different things, including:  · Infection.  · Physical exhaustion.  · Internal bleeding or other blood loss that results in a lack of red blood cells (anemia).  · Dehydration. This cause is more common in elderly people.  · Side effects or electrolyte abnormalities from medicines, such as pain medicines or sedatives.  · Emotional distress, anxiety, or depression.  · Circulation problems, especially severe peripheral arterial disease.  · Heart disease, such as rapid atrial fibrillation, bradycardia, or heart failure.  · Nervous system disorders, such as Guillain-Barré syndrome, multiple sclerosis, or stroke.  DIAGNOSIS   To find the cause of your weakness, your caregiver will take your history and perform a physical exam. Lab tests or X-rays may also be ordered, if needed.  TREATMENT   Treatment of weakness depends on the cause of your symptoms and can vary greatly.  HOME CARE INSTRUCTIONS   · Rest as needed.  · Eat a well-balanced diet.  · Try to get some exercise every day.  · Only take over-the-counter or prescription medicines as directed by your caregiver.  SEEK MEDICAL CARE IF:   · Your weakness seems to be getting worse or spreads to other parts of your body.  · You develop new aches or pains.  SEEK IMMEDIATE MEDICAL CARE IF:   · You cannot perform your normal daily activities, such as getting dressed and feeding yourself.  · You cannot walk up and down stairs, or you feel exhausted when you do so.  · You have shortness of breath or chest pain.  · You have difficulty moving parts of your body.  · You have weakness  in only one area of the body or on only one side of the body.  · You have a fever.  · You have trouble speaking or swallowing.  · You cannot control your bladder or bowel movements.  · You have black or bloody vomit or stools.  MAKE SURE YOU:  · Understand these instructions.  · Will watch your condition.  · Will get help right away if you are not doing well or get worse.  Document Released: 01/31/2005 Document Revised: 08/02/2011 Document Reviewed: 04/01/2011  ExitCare® Patient Information ©2014 [employer].

## 2013-05-28 NOTE — ED Notes (Signed)
Pt to ED via [facility] with c/o left side weakness.  Pt st's symptoms started on Sat.  St's she drove herself to doctors office today and MD called EMS to transport pt to hospital.
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
## 2013-05-28 NOTE — ED Notes (Signed)
Pt requesting pain medication at this time.

## 2013-05-28 NOTE — ED Notes (Signed)
Cab Voucher given to pt to transport pt back to Dr. Shon BatonBrooks office to get her car.
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
## 2014-10-21 ENCOUNTER — Encounter (HOSPITAL_BASED_OUTPATIENT_CLINIC_OR_DEPARTMENT_OTHER): Payer: Self-pay | Admitting: Emergency Medicine

## 2014-10-21 ENCOUNTER — Emergency Department (HOSPITAL_BASED_OUTPATIENT_CLINIC_OR_DEPARTMENT_OTHER)
Admission: EM | Admit: 2014-10-21 | Discharge: 2014-10-21 | Disposition: A | Discharge status: Home / Self Care | Payer: Medicare Other | Attending: Emergency Medicine | Admitting: Emergency Medicine

## 2014-10-21 ENCOUNTER — Emergency Department (HOSPITAL_BASED_OUTPATIENT_CLINIC_OR_DEPARTMENT_OTHER): Payer: Medicare Other

## 2014-10-21 DIAGNOSIS — Z8673 Personal history of transient ischemic attack (TIA), and cerebral infarction without residual deficits: Secondary | ICD-10-CM | POA: Insufficient documentation

## 2014-10-21 DIAGNOSIS — Z720 Tobacco use: Secondary | ICD-10-CM | POA: Insufficient documentation

## 2014-10-21 DIAGNOSIS — Y9389 Activity, other specified: Secondary | ICD-10-CM | POA: Insufficient documentation

## 2014-10-21 DIAGNOSIS — Z8669 Personal history of other diseases of the nervous system and sense organs: Secondary | ICD-10-CM | POA: Insufficient documentation

## 2014-10-21 DIAGNOSIS — R52 Pain, unspecified: Secondary | ICD-10-CM

## 2014-10-21 DIAGNOSIS — Z862 Personal history of diseases of the blood and blood-forming organs and certain disorders involving the immune mechanism: Secondary | ICD-10-CM | POA: Diagnosis not present

## 2014-10-21 DIAGNOSIS — Y92410 Unspecified street and highway as the place of occurrence of the external cause: Secondary | ICD-10-CM | POA: Insufficient documentation

## 2014-10-21 DIAGNOSIS — M25512 Pain in left shoulder: Secondary | ICD-10-CM

## 2014-10-21 DIAGNOSIS — K219 Gastro-esophageal reflux disease without esophagitis: Secondary | ICD-10-CM | POA: Insufficient documentation

## 2014-10-21 DIAGNOSIS — F329 Major depressive disorder, single episode, unspecified: Secondary | ICD-10-CM | POA: Insufficient documentation

## 2014-10-21 DIAGNOSIS — Z79899 Other long term (current) drug therapy: Secondary | ICD-10-CM | POA: Diagnosis not present

## 2014-10-21 DIAGNOSIS — Z8701 Personal history of pneumonia (recurrent): Secondary | ICD-10-CM | POA: Diagnosis not present

## 2014-10-21 DIAGNOSIS — S29002A Unspecified injury of muscle and tendon of back wall of thorax, initial encounter: Secondary | ICD-10-CM | POA: Diagnosis not present

## 2014-10-21 DIAGNOSIS — J449 Chronic obstructive pulmonary disease, unspecified: Secondary | ICD-10-CM | POA: Insufficient documentation

## 2014-10-21 DIAGNOSIS — S4992XA Unspecified injury of left shoulder and upper arm, initial encounter: Secondary | ICD-10-CM | POA: Insufficient documentation

## 2014-10-21 DIAGNOSIS — S4991XA Unspecified injury of right shoulder and upper arm, initial encounter: Secondary | ICD-10-CM | POA: Insufficient documentation

## 2014-10-21 DIAGNOSIS — Y998 Other external cause status: Secondary | ICD-10-CM | POA: Insufficient documentation

## 2014-10-21 DIAGNOSIS — N184 Chronic kidney disease, stage 4 (severe): Secondary | ICD-10-CM | POA: Diagnosis not present

## 2014-10-21 DIAGNOSIS — S29012A Strain of muscle and tendon of back wall of thorax, initial encounter: Secondary | ICD-10-CM

## 2014-10-21 DIAGNOSIS — I129 Hypertensive chronic kidney disease with stage 1 through stage 4 chronic kidney disease, or unspecified chronic kidney disease: Secondary | ICD-10-CM | POA: Insufficient documentation

## 2014-10-21 DIAGNOSIS — M25511 Pain in right shoulder: Secondary | ICD-10-CM

## 2014-10-21 DIAGNOSIS — S299XXA Unspecified injury of thorax, initial encounter: Secondary | ICD-10-CM | POA: Diagnosis present

## 2014-10-21 DIAGNOSIS — F419 Anxiety disorder, unspecified: Secondary | ICD-10-CM | POA: Diagnosis not present

## 2014-10-21 DIAGNOSIS — Z8739 Personal history of other diseases of the musculoskeletal system and connective tissue: Secondary | ICD-10-CM | POA: Diagnosis not present

## 2014-10-21 DIAGNOSIS — E119 Type 2 diabetes mellitus without complications: Secondary | ICD-10-CM | POA: Diagnosis not present

## 2014-10-21 DIAGNOSIS — R011 Cardiac murmur, unspecified: Secondary | ICD-10-CM | POA: Diagnosis not present

## 2014-10-21 DIAGNOSIS — Z880 Allergy status to penicillin: Secondary | ICD-10-CM | POA: Diagnosis not present
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
## 2015-12-16 ENCOUNTER — Ambulatory Visit (INDEPENDENT_AMBULATORY_CARE_PROVIDER_SITE_OTHER): Payer: Medicare Other | Admitting: Psychology

## 2015-12-16 DIAGNOSIS — F411 Generalized anxiety disorder: Secondary | ICD-10-CM | POA: Diagnosis not present

## 2015-12-16 DIAGNOSIS — F330 Major depressive disorder, recurrent, mild: Secondary | ICD-10-CM | POA: Diagnosis not present

## 2015-12-30 ENCOUNTER — Ambulatory Visit (INDEPENDENT_AMBULATORY_CARE_PROVIDER_SITE_OTHER): Payer: 59 | Admitting: Psychology

## 2015-12-30 DIAGNOSIS — F330 Major depressive disorder, recurrent, mild: Secondary | ICD-10-CM

## 2015-12-30 DIAGNOSIS — F411 Generalized anxiety disorder: Secondary | ICD-10-CM

## 2016-01-13 ENCOUNTER — Ambulatory Visit: Payer: 59 | Admitting: Psychology

## 2016-01-20 ENCOUNTER — Ambulatory Visit (INDEPENDENT_AMBULATORY_CARE_PROVIDER_SITE_OTHER): Payer: 59 | Admitting: Psychology

## 2016-01-20 DIAGNOSIS — F330 Major depressive disorder, recurrent, mild: Secondary | ICD-10-CM

## 2016-01-20 DIAGNOSIS — F411 Generalized anxiety disorder: Secondary | ICD-10-CM | POA: Diagnosis not present

## 2016-01-27 ENCOUNTER — Ambulatory Visit: Payer: 59 | Admitting: Psychology

## 2016-02-03 ENCOUNTER — Ambulatory Visit (INDEPENDENT_AMBULATORY_CARE_PROVIDER_SITE_OTHER): Payer: 59 | Admitting: Psychology

## 2016-02-03 DIAGNOSIS — F411 Generalized anxiety disorder: Secondary | ICD-10-CM | POA: Diagnosis not present

## 2016-02-03 DIAGNOSIS — F330 Major depressive disorder, recurrent, mild: Secondary | ICD-10-CM | POA: Diagnosis not present

## 2016-02-10 ENCOUNTER — Ambulatory Visit (INDEPENDENT_AMBULATORY_CARE_PROVIDER_SITE_OTHER): Payer: 59 | Admitting: Psychology

## 2016-02-10 DIAGNOSIS — F411 Generalized anxiety disorder: Secondary | ICD-10-CM | POA: Diagnosis not present

## 2016-02-10 DIAGNOSIS — F330 Major depressive disorder, recurrent, mild: Secondary | ICD-10-CM

## 2016-02-19 ENCOUNTER — Ambulatory Visit: Payer: 59 | Admitting: Psychology

## 2016-02-26 ENCOUNTER — Ambulatory Visit: Payer: 59 | Admitting: Psychology

## 2016-03-04 ENCOUNTER — Ambulatory Visit: Payer: 59 | Admitting: Psychology

## 2016-03-11 ENCOUNTER — Ambulatory Visit: Payer: 59 | Admitting: Psychology

## 2016-03-18 ENCOUNTER — Ambulatory Visit: Payer: 59 | Admitting: Psychology

## 2016-03-25 ENCOUNTER — Ambulatory Visit: Payer: 59 | Admitting: Psychology

## 2016-04-01 ENCOUNTER — Ambulatory Visit: Payer: 59 | Admitting: Psychology

## 2016-04-08 ENCOUNTER — Ambulatory Visit: Payer: 59 | Admitting: Psychology

## 2016-04-22 ENCOUNTER — Ambulatory Visit: Payer: 59 | Admitting: Psychology

## 2016-04-29 ENCOUNTER — Ambulatory Visit: Payer: 59 | Admitting: Psychology
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
## 2019-01-15 ENCOUNTER — Ambulatory Visit (INDEPENDENT_AMBULATORY_CARE_PROVIDER_SITE_OTHER): Payer: Medicare HMO | Admitting: Psychology

## 2019-01-15 DIAGNOSIS — F445 Conversion disorder with seizures or convulsions: Secondary | ICD-10-CM | POA: Diagnosis not present

## 2019-01-15 DIAGNOSIS — F4312 Post-traumatic stress disorder, chronic: Secondary | ICD-10-CM | POA: Diagnosis not present

## 2019-01-23 ENCOUNTER — Ambulatory Visit: Payer: Medicare HMO | Admitting: Psychology

## 2019-01-28 ENCOUNTER — Ambulatory Visit (INDEPENDENT_AMBULATORY_CARE_PROVIDER_SITE_OTHER): Payer: Medicare HMO | Admitting: Psychology

## 2019-01-28 DIAGNOSIS — F445 Conversion disorder with seizures or convulsions: Secondary | ICD-10-CM

## 2019-02-04 ENCOUNTER — Ambulatory Visit: Payer: Medicare HMO | Admitting: Psychology

## 2019-02-18 ENCOUNTER — Ambulatory Visit (INDEPENDENT_AMBULATORY_CARE_PROVIDER_SITE_OTHER): Payer: Medicare HMO | Admitting: Psychology

## 2019-02-18 DIAGNOSIS — F4312 Post-traumatic stress disorder, chronic: Secondary | ICD-10-CM | POA: Diagnosis not present

## 2019-02-18 DIAGNOSIS — F445 Conversion disorder with seizures or convulsions: Secondary | ICD-10-CM

## 2019-02-27 ENCOUNTER — Ambulatory Visit (INDEPENDENT_AMBULATORY_CARE_PROVIDER_SITE_OTHER): Payer: Medicare HMO | Admitting: Psychology

## 2019-02-27 DIAGNOSIS — F445 Conversion disorder with seizures or convulsions: Secondary | ICD-10-CM | POA: Diagnosis not present

## 2019-02-27 DIAGNOSIS — F4312 Post-traumatic stress disorder, chronic: Secondary | ICD-10-CM

## 2019-03-06 ENCOUNTER — Ambulatory Visit: Payer: Medicare HMO | Admitting: Psychology

## 2019-03-12 ENCOUNTER — Ambulatory Visit (INDEPENDENT_AMBULATORY_CARE_PROVIDER_SITE_OTHER): Payer: Medicare HMO | Admitting: Psychology

## 2019-03-12 DIAGNOSIS — F4312 Post-traumatic stress disorder, chronic: Secondary | ICD-10-CM

## 2019-03-12 DIAGNOSIS — F445 Conversion disorder with seizures or convulsions: Secondary | ICD-10-CM

## 2019-03-19 ENCOUNTER — Ambulatory Visit (INDEPENDENT_AMBULATORY_CARE_PROVIDER_SITE_OTHER): Payer: Medicare HMO | Admitting: Psychology

## 2019-03-19 DIAGNOSIS — F4312 Post-traumatic stress disorder, chronic: Secondary | ICD-10-CM

## 2019-03-19 DIAGNOSIS — F445 Conversion disorder with seizures or convulsions: Secondary | ICD-10-CM

## 2019-03-26 ENCOUNTER — Ambulatory Visit (INDEPENDENT_AMBULATORY_CARE_PROVIDER_SITE_OTHER): Payer: Medicare HMO | Admitting: Psychology

## 2019-03-26 DIAGNOSIS — F445 Conversion disorder with seizures or convulsions: Secondary | ICD-10-CM

## 2019-03-26 DIAGNOSIS — F4312 Post-traumatic stress disorder, chronic: Secondary | ICD-10-CM | POA: Diagnosis not present

## 2019-04-02 ENCOUNTER — Ambulatory Visit (INDEPENDENT_AMBULATORY_CARE_PROVIDER_SITE_OTHER): Payer: Medicare HMO | Admitting: Psychology

## 2019-04-02 DIAGNOSIS — F445 Conversion disorder with seizures or convulsions: Secondary | ICD-10-CM

## 2019-04-02 DIAGNOSIS — F4312 Post-traumatic stress disorder, chronic: Secondary | ICD-10-CM | POA: Diagnosis not present

## 2019-04-09 ENCOUNTER — Ambulatory Visit (INDEPENDENT_AMBULATORY_CARE_PROVIDER_SITE_OTHER): Payer: Medicare HMO | Admitting: Psychology

## 2019-04-09 DIAGNOSIS — F445 Conversion disorder with seizures or convulsions: Secondary | ICD-10-CM

## 2019-04-09 DIAGNOSIS — F4312 Post-traumatic stress disorder, chronic: Secondary | ICD-10-CM | POA: Diagnosis not present

## 2019-04-16 ENCOUNTER — Ambulatory Visit: Payer: Medicare HMO | Admitting: Psychology

## 2019-04-23 ENCOUNTER — Ambulatory Visit (INDEPENDENT_AMBULATORY_CARE_PROVIDER_SITE_OTHER): Payer: Medicare HMO | Admitting: Psychology

## 2019-04-23 DIAGNOSIS — F4312 Post-traumatic stress disorder, chronic: Secondary | ICD-10-CM | POA: Diagnosis not present

## 2019-04-23 DIAGNOSIS — F445 Conversion disorder with seizures or convulsions: Secondary | ICD-10-CM

## 2019-04-30 ENCOUNTER — Ambulatory Visit (INDEPENDENT_AMBULATORY_CARE_PROVIDER_SITE_OTHER): Payer: Medicare HMO | Admitting: Psychology

## 2019-04-30 DIAGNOSIS — F4312 Post-traumatic stress disorder, chronic: Secondary | ICD-10-CM | POA: Diagnosis not present

## 2019-04-30 DIAGNOSIS — F445 Conversion disorder with seizures or convulsions: Secondary | ICD-10-CM | POA: Diagnosis not present

## 2019-05-07 ENCOUNTER — Ambulatory Visit (INDEPENDENT_AMBULATORY_CARE_PROVIDER_SITE_OTHER): Payer: Medicare HMO | Admitting: Psychology

## 2019-05-07 DIAGNOSIS — F4312 Post-traumatic stress disorder, chronic: Secondary | ICD-10-CM | POA: Diagnosis not present

## 2019-05-07 DIAGNOSIS — F445 Conversion disorder with seizures or convulsions: Secondary | ICD-10-CM | POA: Diagnosis not present

## 2019-05-14 ENCOUNTER — Ambulatory Visit: Payer: Medicare HMO | Admitting: Psychology

## 2019-05-21 ENCOUNTER — Ambulatory Visit (INDEPENDENT_AMBULATORY_CARE_PROVIDER_SITE_OTHER): Payer: Medicare HMO | Admitting: Psychology

## 2019-05-21 DIAGNOSIS — F445 Conversion disorder with seizures or convulsions: Secondary | ICD-10-CM

## 2019-05-21 DIAGNOSIS — F4312 Post-traumatic stress disorder, chronic: Secondary | ICD-10-CM | POA: Diagnosis not present

## 2019-05-28 ENCOUNTER — Ambulatory Visit (INDEPENDENT_AMBULATORY_CARE_PROVIDER_SITE_OTHER): Payer: Medicare HMO | Admitting: Psychology

## 2019-05-28 DIAGNOSIS — F445 Conversion disorder with seizures or convulsions: Secondary | ICD-10-CM | POA: Diagnosis not present

## 2019-05-28 DIAGNOSIS — F4312 Post-traumatic stress disorder, chronic: Secondary | ICD-10-CM

## 2019-06-04 ENCOUNTER — Ambulatory Visit (INDEPENDENT_AMBULATORY_CARE_PROVIDER_SITE_OTHER): Payer: Medicare HMO | Admitting: Psychology

## 2019-06-04 DIAGNOSIS — F4312 Post-traumatic stress disorder, chronic: Secondary | ICD-10-CM | POA: Diagnosis not present

## 2019-06-04 DIAGNOSIS — F445 Conversion disorder with seizures or convulsions: Secondary | ICD-10-CM | POA: Diagnosis not present

## 2019-06-11 ENCOUNTER — Ambulatory Visit: Payer: Medicare HMO | Admitting: Psychology

## 2019-06-18 ENCOUNTER — Ambulatory Visit (INDEPENDENT_AMBULATORY_CARE_PROVIDER_SITE_OTHER): Payer: Medicare HMO | Admitting: Psychology

## 2019-06-18 DIAGNOSIS — F445 Conversion disorder with seizures or convulsions: Secondary | ICD-10-CM | POA: Diagnosis not present

## 2019-06-18 DIAGNOSIS — F4312 Post-traumatic stress disorder, chronic: Secondary | ICD-10-CM | POA: Diagnosis not present

## 2019-06-25 ENCOUNTER — Ambulatory Visit (INDEPENDENT_AMBULATORY_CARE_PROVIDER_SITE_OTHER): Payer: Medicare HMO | Admitting: Psychology

## 2019-06-25 DIAGNOSIS — F445 Conversion disorder with seizures or convulsions: Secondary | ICD-10-CM

## 2019-06-25 DIAGNOSIS — F4312 Post-traumatic stress disorder, chronic: Secondary | ICD-10-CM | POA: Diagnosis not present

## 2019-07-02 ENCOUNTER — Ambulatory Visit (INDEPENDENT_AMBULATORY_CARE_PROVIDER_SITE_OTHER): Payer: Medicare HMO | Admitting: Psychology

## 2019-07-02 DIAGNOSIS — F445 Conversion disorder with seizures or convulsions: Secondary | ICD-10-CM | POA: Diagnosis not present

## 2019-07-02 DIAGNOSIS — F4312 Post-traumatic stress disorder, chronic: Secondary | ICD-10-CM

## 2019-07-09 ENCOUNTER — Ambulatory Visit: Payer: Medicare HMO | Admitting: Psychology

## 2019-07-16 ENCOUNTER — Ambulatory Visit (INDEPENDENT_AMBULATORY_CARE_PROVIDER_SITE_OTHER): Payer: Medicare HMO | Admitting: Psychology

## 2019-07-16 DIAGNOSIS — F4312 Post-traumatic stress disorder, chronic: Secondary | ICD-10-CM | POA: Diagnosis not present

## 2019-07-16 DIAGNOSIS — F445 Conversion disorder with seizures or convulsions: Secondary | ICD-10-CM

## 2019-07-23 ENCOUNTER — Ambulatory Visit: Payer: Medicare HMO | Admitting: Psychology

## 2019-07-25 ENCOUNTER — Ambulatory Visit: Payer: Medicare HMO | Admitting: Psychology

## 2019-07-30 ENCOUNTER — Ambulatory Visit (INDEPENDENT_AMBULATORY_CARE_PROVIDER_SITE_OTHER): Payer: Medicare HMO | Admitting: Psychology

## 2019-07-30 DIAGNOSIS — F445 Conversion disorder with seizures or convulsions: Secondary | ICD-10-CM | POA: Diagnosis not present

## 2019-07-30 DIAGNOSIS — F4312 Post-traumatic stress disorder, chronic: Secondary | ICD-10-CM | POA: Diagnosis not present

## 2019-08-06 ENCOUNTER — Ambulatory Visit (INDEPENDENT_AMBULATORY_CARE_PROVIDER_SITE_OTHER): Payer: Medicare HMO | Admitting: Psychology

## 2019-08-06 DIAGNOSIS — F4312 Post-traumatic stress disorder, chronic: Secondary | ICD-10-CM

## 2019-08-06 DIAGNOSIS — F445 Conversion disorder with seizures or convulsions: Secondary | ICD-10-CM

## 2019-08-13 ENCOUNTER — Ambulatory Visit (INDEPENDENT_AMBULATORY_CARE_PROVIDER_SITE_OTHER): Payer: Medicare HMO | Admitting: Psychology

## 2019-08-13 DIAGNOSIS — F445 Conversion disorder with seizures or convulsions: Secondary | ICD-10-CM | POA: Diagnosis not present

## 2019-08-13 DIAGNOSIS — F4312 Post-traumatic stress disorder, chronic: Secondary | ICD-10-CM | POA: Diagnosis not present

## 2019-08-20 ENCOUNTER — Ambulatory Visit (INDEPENDENT_AMBULATORY_CARE_PROVIDER_SITE_OTHER): Payer: Medicare HMO | Admitting: Psychology

## 2019-08-20 DIAGNOSIS — F4312 Post-traumatic stress disorder, chronic: Secondary | ICD-10-CM | POA: Diagnosis not present

## 2019-08-20 DIAGNOSIS — F445 Conversion disorder with seizures or convulsions: Secondary | ICD-10-CM

## 2019-08-27 ENCOUNTER — Ambulatory Visit (INDEPENDENT_AMBULATORY_CARE_PROVIDER_SITE_OTHER): Payer: Medicare HMO | Admitting: Psychology

## 2019-08-27 DIAGNOSIS — F445 Conversion disorder with seizures or convulsions: Secondary | ICD-10-CM

## 2019-08-27 DIAGNOSIS — F4312 Post-traumatic stress disorder, chronic: Secondary | ICD-10-CM

## 2019-09-03 ENCOUNTER — Ambulatory Visit: Payer: Medicare HMO | Admitting: Psychology

## 2019-09-10 ENCOUNTER — Ambulatory Visit (INDEPENDENT_AMBULATORY_CARE_PROVIDER_SITE_OTHER): Payer: Medicare HMO | Admitting: Psychology

## 2019-09-10 DIAGNOSIS — F445 Conversion disorder with seizures or convulsions: Secondary | ICD-10-CM

## 2019-09-10 DIAGNOSIS — F4312 Post-traumatic stress disorder, chronic: Secondary | ICD-10-CM | POA: Diagnosis not present

## 2019-09-17 ENCOUNTER — Ambulatory Visit (INDEPENDENT_AMBULATORY_CARE_PROVIDER_SITE_OTHER): Payer: Medicare HMO | Admitting: Psychology

## 2019-09-17 DIAGNOSIS — F4312 Post-traumatic stress disorder, chronic: Secondary | ICD-10-CM

## 2019-09-17 DIAGNOSIS — F445 Conversion disorder with seizures or convulsions: Secondary | ICD-10-CM | POA: Diagnosis not present

## 2019-09-24 ENCOUNTER — Ambulatory Visit (INDEPENDENT_AMBULATORY_CARE_PROVIDER_SITE_OTHER): Payer: Medicare HMO | Admitting: Psychology

## 2019-09-24 DIAGNOSIS — F4312 Post-traumatic stress disorder, chronic: Secondary | ICD-10-CM

## 2019-09-24 DIAGNOSIS — F445 Conversion disorder with seizures or convulsions: Secondary | ICD-10-CM

## 2019-10-01 ENCOUNTER — Ambulatory Visit (INDEPENDENT_AMBULATORY_CARE_PROVIDER_SITE_OTHER): Payer: Medicare HMO | Admitting: Psychology

## 2019-10-01 DIAGNOSIS — F445 Conversion disorder with seizures or convulsions: Secondary | ICD-10-CM

## 2019-10-01 DIAGNOSIS — F4312 Post-traumatic stress disorder, chronic: Secondary | ICD-10-CM | POA: Diagnosis not present

## 2019-10-08 ENCOUNTER — Ambulatory Visit (INDEPENDENT_AMBULATORY_CARE_PROVIDER_SITE_OTHER): Payer: Medicare HMO | Admitting: Psychology

## 2019-10-08 DIAGNOSIS — F4312 Post-traumatic stress disorder, chronic: Secondary | ICD-10-CM | POA: Diagnosis not present

## 2019-10-08 DIAGNOSIS — F445 Conversion disorder with seizures or convulsions: Secondary | ICD-10-CM | POA: Diagnosis not present

## 2019-10-15 ENCOUNTER — Ambulatory Visit (INDEPENDENT_AMBULATORY_CARE_PROVIDER_SITE_OTHER): Payer: Medicare HMO | Admitting: Psychology

## 2019-10-15 DIAGNOSIS — F445 Conversion disorder with seizures or convulsions: Secondary | ICD-10-CM | POA: Diagnosis not present

## 2019-10-15 DIAGNOSIS — F4312 Post-traumatic stress disorder, chronic: Secondary | ICD-10-CM | POA: Diagnosis not present

## 2019-10-22 ENCOUNTER — Ambulatory Visit (INDEPENDENT_AMBULATORY_CARE_PROVIDER_SITE_OTHER): Payer: Medicare HMO | Admitting: Psychology

## 2019-10-22 DIAGNOSIS — F4312 Post-traumatic stress disorder, chronic: Secondary | ICD-10-CM

## 2019-10-22 DIAGNOSIS — F445 Conversion disorder with seizures or convulsions: Secondary | ICD-10-CM

## 2019-10-29 ENCOUNTER — Ambulatory Visit (INDEPENDENT_AMBULATORY_CARE_PROVIDER_SITE_OTHER): Payer: Medicare HMO | Admitting: Psychology

## 2019-10-29 DIAGNOSIS — F445 Conversion disorder with seizures or convulsions: Secondary | ICD-10-CM | POA: Diagnosis not present

## 2019-10-29 DIAGNOSIS — F4312 Post-traumatic stress disorder, chronic: Secondary | ICD-10-CM | POA: Diagnosis not present

## 2019-11-05 ENCOUNTER — Ambulatory Visit (INDEPENDENT_AMBULATORY_CARE_PROVIDER_SITE_OTHER): Payer: Medicare HMO | Admitting: Psychology

## 2019-11-05 DIAGNOSIS — F445 Conversion disorder with seizures or convulsions: Secondary | ICD-10-CM

## 2019-11-05 DIAGNOSIS — F4312 Post-traumatic stress disorder, chronic: Secondary | ICD-10-CM | POA: Diagnosis not present

## 2019-11-12 ENCOUNTER — Ambulatory Visit (INDEPENDENT_AMBULATORY_CARE_PROVIDER_SITE_OTHER): Payer: Medicare HMO | Admitting: Psychology

## 2019-11-12 DIAGNOSIS — F445 Conversion disorder with seizures or convulsions: Secondary | ICD-10-CM

## 2019-11-12 DIAGNOSIS — F4312 Post-traumatic stress disorder, chronic: Secondary | ICD-10-CM | POA: Diagnosis not present

## 2019-11-19 ENCOUNTER — Ambulatory Visit (INDEPENDENT_AMBULATORY_CARE_PROVIDER_SITE_OTHER): Payer: Medicare HMO | Admitting: Psychology

## 2019-11-19 DIAGNOSIS — F4312 Post-traumatic stress disorder, chronic: Secondary | ICD-10-CM | POA: Diagnosis not present

## 2019-11-19 DIAGNOSIS — F445 Conversion disorder with seizures or convulsions: Secondary | ICD-10-CM | POA: Diagnosis not present

## 2019-11-26 ENCOUNTER — Ambulatory Visit (INDEPENDENT_AMBULATORY_CARE_PROVIDER_SITE_OTHER): Payer: Medicare HMO | Admitting: Psychology

## 2019-11-26 DIAGNOSIS — F445 Conversion disorder with seizures or convulsions: Secondary | ICD-10-CM | POA: Diagnosis not present

## 2019-11-26 DIAGNOSIS — F4312 Post-traumatic stress disorder, chronic: Secondary | ICD-10-CM | POA: Diagnosis not present

## 2019-12-03 ENCOUNTER — Ambulatory Visit (INDEPENDENT_AMBULATORY_CARE_PROVIDER_SITE_OTHER): Payer: Medicare HMO | Admitting: Psychology

## 2019-12-03 DIAGNOSIS — F4312 Post-traumatic stress disorder, chronic: Secondary | ICD-10-CM | POA: Diagnosis not present

## 2019-12-03 DIAGNOSIS — F445 Conversion disorder with seizures or convulsions: Secondary | ICD-10-CM

## 2019-12-10 ENCOUNTER — Ambulatory Visit (INDEPENDENT_AMBULATORY_CARE_PROVIDER_SITE_OTHER): Payer: Medicare HMO | Admitting: Psychology

## 2019-12-10 DIAGNOSIS — F4312 Post-traumatic stress disorder, chronic: Secondary | ICD-10-CM

## 2019-12-10 DIAGNOSIS — F445 Conversion disorder with seizures or convulsions: Secondary | ICD-10-CM

## 2019-12-17 ENCOUNTER — Ambulatory Visit (INDEPENDENT_AMBULATORY_CARE_PROVIDER_SITE_OTHER): Payer: Medicare HMO | Admitting: Psychology

## 2019-12-17 DIAGNOSIS — F445 Conversion disorder with seizures or convulsions: Secondary | ICD-10-CM

## 2019-12-17 DIAGNOSIS — F4312 Post-traumatic stress disorder, chronic: Secondary | ICD-10-CM | POA: Diagnosis not present

## 2019-12-24 ENCOUNTER — Ambulatory Visit: Payer: Medicare HMO | Admitting: Psychology

## 2019-12-31 ENCOUNTER — Ambulatory Visit (INDEPENDENT_AMBULATORY_CARE_PROVIDER_SITE_OTHER): Payer: Medicare HMO | Admitting: Psychology

## 2019-12-31 DIAGNOSIS — F445 Conversion disorder with seizures or convulsions: Secondary | ICD-10-CM | POA: Diagnosis not present

## 2019-12-31 DIAGNOSIS — F4312 Post-traumatic stress disorder, chronic: Secondary | ICD-10-CM

## 2020-01-07 ENCOUNTER — Ambulatory Visit: Payer: Medicare HMO | Admitting: Psychology

## 2020-01-14 ENCOUNTER — Ambulatory Visit: Payer: Medicare HMO | Admitting: Psychology

## 2020-01-21 ENCOUNTER — Ambulatory Visit: Payer: Medicare HMO | Admitting: Psychology

## 2020-01-28 ENCOUNTER — Ambulatory Visit: Payer: Medicare HMO | Admitting: Psychology

## 2020-02-04 ENCOUNTER — Ambulatory Visit (INDEPENDENT_AMBULATORY_CARE_PROVIDER_SITE_OTHER): Payer: Medicare HMO | Admitting: Psychology

## 2020-02-04 DIAGNOSIS — F445 Conversion disorder with seizures or convulsions: Secondary | ICD-10-CM

## 2020-02-04 DIAGNOSIS — F4312 Post-traumatic stress disorder, chronic: Secondary | ICD-10-CM | POA: Diagnosis not present

## 2020-02-11 ENCOUNTER — Ambulatory Visit: Payer: Medicare HMO | Admitting: Psychology

## 2020-02-18 ENCOUNTER — Ambulatory Visit (INDEPENDENT_AMBULATORY_CARE_PROVIDER_SITE_OTHER): Payer: Medicare HMO | Admitting: Psychology

## 2020-02-18 DIAGNOSIS — F4312 Post-traumatic stress disorder, chronic: Secondary | ICD-10-CM

## 2020-02-18 DIAGNOSIS — F445 Conversion disorder with seizures or convulsions: Secondary | ICD-10-CM | POA: Diagnosis not present

## 2020-02-25 ENCOUNTER — Ambulatory Visit (INDEPENDENT_AMBULATORY_CARE_PROVIDER_SITE_OTHER): Payer: Medicare HMO | Admitting: Psychology

## 2020-02-25 DIAGNOSIS — F4312 Post-traumatic stress disorder, chronic: Secondary | ICD-10-CM | POA: Diagnosis not present

## 2020-02-25 DIAGNOSIS — F445 Conversion disorder with seizures or convulsions: Secondary | ICD-10-CM

## 2020-03-03 ENCOUNTER — Ambulatory Visit: Payer: Medicare HMO | Admitting: Psychology

## 2020-03-10 ENCOUNTER — Ambulatory Visit (INDEPENDENT_AMBULATORY_CARE_PROVIDER_SITE_OTHER): Payer: Medicare HMO | Admitting: Psychology

## 2020-03-10 DIAGNOSIS — F445 Conversion disorder with seizures or convulsions: Secondary | ICD-10-CM | POA: Diagnosis not present

## 2020-03-10 DIAGNOSIS — F4312 Post-traumatic stress disorder, chronic: Secondary | ICD-10-CM | POA: Diagnosis not present

## 2020-03-17 ENCOUNTER — Ambulatory Visit: Payer: Medicare HMO | Admitting: Psychology

## 2020-03-24 ENCOUNTER — Ambulatory Visit (INDEPENDENT_AMBULATORY_CARE_PROVIDER_SITE_OTHER): Payer: Medicare HMO | Admitting: Psychology

## 2020-03-24 DIAGNOSIS — F4312 Post-traumatic stress disorder, chronic: Secondary | ICD-10-CM | POA: Diagnosis not present

## 2020-03-24 DIAGNOSIS — F445 Conversion disorder with seizures or convulsions: Secondary | ICD-10-CM

## 2020-03-31 ENCOUNTER — Ambulatory Visit: Payer: Medicare HMO | Admitting: Psychology

## 2020-04-01 ENCOUNTER — Ambulatory Visit (INDEPENDENT_AMBULATORY_CARE_PROVIDER_SITE_OTHER): Payer: Medicare HMO | Admitting: Psychology

## 2020-04-01 DIAGNOSIS — F4312 Post-traumatic stress disorder, chronic: Secondary | ICD-10-CM

## 2020-04-01 DIAGNOSIS — F445 Conversion disorder with seizures or convulsions: Secondary | ICD-10-CM

## 2020-04-07 ENCOUNTER — Ambulatory Visit (INDEPENDENT_AMBULATORY_CARE_PROVIDER_SITE_OTHER): Payer: Medicare HMO | Admitting: Psychology

## 2020-04-07 ENCOUNTER — Ambulatory Visit: Payer: Medicare HMO | Admitting: Psychology

## 2020-04-07 DIAGNOSIS — F445 Conversion disorder with seizures or convulsions: Secondary | ICD-10-CM

## 2020-04-07 DIAGNOSIS — F4312 Post-traumatic stress disorder, chronic: Secondary | ICD-10-CM

## 2020-04-14 ENCOUNTER — Ambulatory Visit: Payer: Medicare HMO | Admitting: Psychology

## 2020-04-14 ENCOUNTER — Ambulatory Visit (INDEPENDENT_AMBULATORY_CARE_PROVIDER_SITE_OTHER): Payer: Medicare HMO | Admitting: Psychology

## 2020-04-14 DIAGNOSIS — F445 Conversion disorder with seizures or convulsions: Secondary | ICD-10-CM | POA: Diagnosis not present

## 2020-04-14 DIAGNOSIS — F4312 Post-traumatic stress disorder, chronic: Secondary | ICD-10-CM | POA: Diagnosis not present

## 2020-04-21 ENCOUNTER — Ambulatory Visit: Payer: Medicare HMO | Admitting: Psychology

## 2020-04-21 ENCOUNTER — Ambulatory Visit (INDEPENDENT_AMBULATORY_CARE_PROVIDER_SITE_OTHER): Payer: Medicare HMO | Admitting: Psychology

## 2020-04-21 DIAGNOSIS — F4312 Post-traumatic stress disorder, chronic: Secondary | ICD-10-CM

## 2020-04-21 DIAGNOSIS — F445 Conversion disorder with seizures or convulsions: Secondary | ICD-10-CM | POA: Diagnosis not present

## 2020-04-28 ENCOUNTER — Ambulatory Visit (INDEPENDENT_AMBULATORY_CARE_PROVIDER_SITE_OTHER): Payer: Medicare HMO | Admitting: Psychology

## 2020-04-28 ENCOUNTER — Ambulatory Visit: Payer: Medicare HMO | Admitting: Psychology

## 2020-04-28 DIAGNOSIS — F4312 Post-traumatic stress disorder, chronic: Secondary | ICD-10-CM | POA: Diagnosis not present

## 2020-04-28 DIAGNOSIS — F445 Conversion disorder with seizures or convulsions: Secondary | ICD-10-CM

## 2020-05-05 ENCOUNTER — Ambulatory Visit: Payer: Medicare HMO | Admitting: Psychology

## 2020-05-05 ENCOUNTER — Ambulatory Visit (INDEPENDENT_AMBULATORY_CARE_PROVIDER_SITE_OTHER): Payer: Medicare HMO | Admitting: Psychology

## 2020-05-05 DIAGNOSIS — F4312 Post-traumatic stress disorder, chronic: Secondary | ICD-10-CM | POA: Diagnosis not present

## 2020-05-05 DIAGNOSIS — F445 Conversion disorder with seizures or convulsions: Secondary | ICD-10-CM

## 2020-05-12 ENCOUNTER — Ambulatory Visit (INDEPENDENT_AMBULATORY_CARE_PROVIDER_SITE_OTHER): Payer: Medicare HMO | Admitting: Psychology

## 2020-05-12 ENCOUNTER — Ambulatory Visit: Payer: Medicare HMO | Admitting: Psychology

## 2020-05-12 DIAGNOSIS — F4312 Post-traumatic stress disorder, chronic: Secondary | ICD-10-CM | POA: Diagnosis not present

## 2020-05-12 DIAGNOSIS — F445 Conversion disorder with seizures or convulsions: Secondary | ICD-10-CM

## 2020-05-19 ENCOUNTER — Ambulatory Visit (INDEPENDENT_AMBULATORY_CARE_PROVIDER_SITE_OTHER): Payer: Medicare HMO | Admitting: Psychology

## 2020-05-19 ENCOUNTER — Ambulatory Visit: Payer: Medicare HMO | Admitting: Psychology

## 2020-05-19 DIAGNOSIS — F445 Conversion disorder with seizures or convulsions: Secondary | ICD-10-CM

## 2020-05-19 DIAGNOSIS — F4312 Post-traumatic stress disorder, chronic: Secondary | ICD-10-CM

## 2020-05-26 ENCOUNTER — Ambulatory Visit: Payer: Medicare HMO | Admitting: Psychology

## 2020-06-02 ENCOUNTER — Ambulatory Visit: Payer: Medicare HMO | Admitting: Psychology

## 2020-06-02 ENCOUNTER — Ambulatory Visit (INDEPENDENT_AMBULATORY_CARE_PROVIDER_SITE_OTHER): Payer: Medicare HMO | Admitting: Psychology

## 2020-06-02 DIAGNOSIS — F4312 Post-traumatic stress disorder, chronic: Secondary | ICD-10-CM | POA: Diagnosis not present

## 2020-06-02 DIAGNOSIS — F445 Conversion disorder with seizures or convulsions: Secondary | ICD-10-CM | POA: Diagnosis not present

## 2020-06-09 ENCOUNTER — Ambulatory Visit: Payer: Medicare HMO | Admitting: Psychology

## 2020-06-09 ENCOUNTER — Ambulatory Visit (INDEPENDENT_AMBULATORY_CARE_PROVIDER_SITE_OTHER): Payer: Medicare HMO | Admitting: Psychology

## 2020-06-09 DIAGNOSIS — F445 Conversion disorder with seizures or convulsions: Secondary | ICD-10-CM | POA: Diagnosis not present

## 2020-06-09 DIAGNOSIS — F4312 Post-traumatic stress disorder, chronic: Secondary | ICD-10-CM

## 2020-06-17 ENCOUNTER — Ambulatory Visit (INDEPENDENT_AMBULATORY_CARE_PROVIDER_SITE_OTHER): Payer: Medicare HMO | Admitting: Psychology

## 2020-06-17 DIAGNOSIS — F4312 Post-traumatic stress disorder, chronic: Secondary | ICD-10-CM | POA: Diagnosis not present

## 2020-06-24 ENCOUNTER — Ambulatory Visit (INDEPENDENT_AMBULATORY_CARE_PROVIDER_SITE_OTHER): Payer: Medicare HMO | Admitting: Psychology

## 2020-06-24 DIAGNOSIS — F4312 Post-traumatic stress disorder, chronic: Secondary | ICD-10-CM | POA: Diagnosis not present

## 2020-07-01 ENCOUNTER — Ambulatory Visit (INDEPENDENT_AMBULATORY_CARE_PROVIDER_SITE_OTHER): Payer: Medicare HMO | Admitting: Psychology

## 2020-07-01 DIAGNOSIS — F4312 Post-traumatic stress disorder, chronic: Secondary | ICD-10-CM

## 2020-07-08 ENCOUNTER — Ambulatory Visit (INDEPENDENT_AMBULATORY_CARE_PROVIDER_SITE_OTHER): Payer: Medicare HMO | Admitting: Psychology

## 2020-07-08 DIAGNOSIS — F4312 Post-traumatic stress disorder, chronic: Secondary | ICD-10-CM

## 2020-07-15 ENCOUNTER — Ambulatory Visit: Payer: Medicare HMO | Admitting: Psychology

## 2020-07-22 ENCOUNTER — Ambulatory Visit (INDEPENDENT_AMBULATORY_CARE_PROVIDER_SITE_OTHER): Payer: Medicare HMO | Admitting: Psychology

## 2020-07-22 DIAGNOSIS — F4312 Post-traumatic stress disorder, chronic: Secondary | ICD-10-CM

## 2020-07-29 ENCOUNTER — Ambulatory Visit (INDEPENDENT_AMBULATORY_CARE_PROVIDER_SITE_OTHER): Payer: Medicare HMO | Admitting: Psychology

## 2020-07-29 DIAGNOSIS — F4312 Post-traumatic stress disorder, chronic: Secondary | ICD-10-CM | POA: Diagnosis not present

## 2020-08-05 ENCOUNTER — Ambulatory Visit (INDEPENDENT_AMBULATORY_CARE_PROVIDER_SITE_OTHER): Payer: Medicare HMO | Admitting: Psychology

## 2020-08-05 DIAGNOSIS — F4312 Post-traumatic stress disorder, chronic: Secondary | ICD-10-CM

## 2020-08-12 ENCOUNTER — Ambulatory Visit (INDEPENDENT_AMBULATORY_CARE_PROVIDER_SITE_OTHER): Payer: Medicare HMO | Admitting: Psychology

## 2020-08-12 DIAGNOSIS — F4312 Post-traumatic stress disorder, chronic: Secondary | ICD-10-CM | POA: Diagnosis not present

## 2020-08-19 ENCOUNTER — Ambulatory Visit (INDEPENDENT_AMBULATORY_CARE_PROVIDER_SITE_OTHER): Payer: Medicare HMO | Admitting: Psychology

## 2020-08-19 DIAGNOSIS — F4312 Post-traumatic stress disorder, chronic: Secondary | ICD-10-CM | POA: Diagnosis not present

## 2020-08-26 ENCOUNTER — Ambulatory Visit (INDEPENDENT_AMBULATORY_CARE_PROVIDER_SITE_OTHER): Payer: Medicare HMO | Admitting: Psychology

## 2020-08-26 DIAGNOSIS — F4312 Post-traumatic stress disorder, chronic: Secondary | ICD-10-CM

## 2020-09-02 ENCOUNTER — Ambulatory Visit (INDEPENDENT_AMBULATORY_CARE_PROVIDER_SITE_OTHER): Payer: Medicare HMO | Admitting: Psychology

## 2020-09-02 DIAGNOSIS — F4312 Post-traumatic stress disorder, chronic: Secondary | ICD-10-CM

## 2020-09-09 ENCOUNTER — Ambulatory Visit (INDEPENDENT_AMBULATORY_CARE_PROVIDER_SITE_OTHER): Payer: Medicare HMO | Admitting: Psychology

## 2020-09-09 DIAGNOSIS — F4312 Post-traumatic stress disorder, chronic: Secondary | ICD-10-CM

## 2020-09-16 ENCOUNTER — Ambulatory Visit (INDEPENDENT_AMBULATORY_CARE_PROVIDER_SITE_OTHER): Payer: Medicare HMO | Admitting: Psychology

## 2020-09-16 DIAGNOSIS — F4312 Post-traumatic stress disorder, chronic: Secondary | ICD-10-CM | POA: Diagnosis not present

## 2020-09-23 ENCOUNTER — Ambulatory Visit (INDEPENDENT_AMBULATORY_CARE_PROVIDER_SITE_OTHER): Payer: Medicare HMO | Admitting: Psychology

## 2020-09-23 DIAGNOSIS — F4312 Post-traumatic stress disorder, chronic: Secondary | ICD-10-CM | POA: Diagnosis not present

## 2020-09-30 ENCOUNTER — Ambulatory Visit (INDEPENDENT_AMBULATORY_CARE_PROVIDER_SITE_OTHER): Payer: Medicare HMO | Admitting: Psychology

## 2020-09-30 DIAGNOSIS — F4312 Post-traumatic stress disorder, chronic: Secondary | ICD-10-CM | POA: Diagnosis not present

## 2020-10-07 ENCOUNTER — Ambulatory Visit (INDEPENDENT_AMBULATORY_CARE_PROVIDER_SITE_OTHER): Payer: Medicare HMO | Admitting: Psychology

## 2020-10-07 DIAGNOSIS — F4312 Post-traumatic stress disorder, chronic: Secondary | ICD-10-CM

## 2020-10-14 ENCOUNTER — Ambulatory Visit (INDEPENDENT_AMBULATORY_CARE_PROVIDER_SITE_OTHER): Payer: Medicare HMO | Admitting: Psychology

## 2020-10-14 DIAGNOSIS — F4312 Post-traumatic stress disorder, chronic: Secondary | ICD-10-CM

## 2020-10-21 ENCOUNTER — Ambulatory Visit (INDEPENDENT_AMBULATORY_CARE_PROVIDER_SITE_OTHER): Payer: Medicare HMO | Admitting: Psychology

## 2020-10-21 DIAGNOSIS — F4312 Post-traumatic stress disorder, chronic: Secondary | ICD-10-CM | POA: Diagnosis not present

## 2020-10-28 ENCOUNTER — Ambulatory Visit (INDEPENDENT_AMBULATORY_CARE_PROVIDER_SITE_OTHER): Payer: Medicare HMO | Admitting: Psychology

## 2020-10-28 DIAGNOSIS — F4312 Post-traumatic stress disorder, chronic: Secondary | ICD-10-CM | POA: Diagnosis not present

## 2020-11-04 ENCOUNTER — Ambulatory Visit (INDEPENDENT_AMBULATORY_CARE_PROVIDER_SITE_OTHER): Payer: Medicare HMO | Admitting: Psychology

## 2020-11-04 DIAGNOSIS — F4312 Post-traumatic stress disorder, chronic: Secondary | ICD-10-CM | POA: Diagnosis not present

## 2020-11-11 ENCOUNTER — Ambulatory Visit (INDEPENDENT_AMBULATORY_CARE_PROVIDER_SITE_OTHER): Payer: Medicare HMO | Admitting: Psychology

## 2020-11-11 DIAGNOSIS — F4312 Post-traumatic stress disorder, chronic: Secondary | ICD-10-CM | POA: Diagnosis not present

## 2020-11-18 ENCOUNTER — Ambulatory Visit (INDEPENDENT_AMBULATORY_CARE_PROVIDER_SITE_OTHER): Payer: Medicare HMO | Admitting: Psychology

## 2020-11-18 DIAGNOSIS — F4312 Post-traumatic stress disorder, chronic: Secondary | ICD-10-CM | POA: Diagnosis not present

## 2020-11-25 ENCOUNTER — Ambulatory Visit (INDEPENDENT_AMBULATORY_CARE_PROVIDER_SITE_OTHER): Payer: Medicare HMO | Admitting: Psychology

## 2020-11-25 DIAGNOSIS — F4312 Post-traumatic stress disorder, chronic: Secondary | ICD-10-CM

## 2020-12-02 ENCOUNTER — Ambulatory Visit (INDEPENDENT_AMBULATORY_CARE_PROVIDER_SITE_OTHER): Payer: Medicare HMO | Admitting: Psychology

## 2020-12-02 DIAGNOSIS — F4312 Post-traumatic stress disorder, chronic: Secondary | ICD-10-CM

## 2020-12-09 ENCOUNTER — Ambulatory Visit: Payer: Medicare HMO | Admitting: Psychology

## 2020-12-16 ENCOUNTER — Ambulatory Visit (INDEPENDENT_AMBULATORY_CARE_PROVIDER_SITE_OTHER): Payer: Medicare HMO | Admitting: Psychology

## 2020-12-16 DIAGNOSIS — F4312 Post-traumatic stress disorder, chronic: Secondary | ICD-10-CM | POA: Diagnosis not present

## 2020-12-30 ENCOUNTER — Ambulatory Visit (INDEPENDENT_AMBULATORY_CARE_PROVIDER_SITE_OTHER): Payer: Medicare HMO | Admitting: Psychology

## 2020-12-30 DIAGNOSIS — F4312 Post-traumatic stress disorder, chronic: Secondary | ICD-10-CM

## 2021-01-06 ENCOUNTER — Ambulatory Visit (INDEPENDENT_AMBULATORY_CARE_PROVIDER_SITE_OTHER): Payer: Medicare HMO | Admitting: Psychology

## 2021-01-06 DIAGNOSIS — F4312 Post-traumatic stress disorder, chronic: Secondary | ICD-10-CM

## 2021-01-06 DIAGNOSIS — F411 Generalized anxiety disorder: Secondary | ICD-10-CM | POA: Diagnosis not present

## 2021-01-06 DIAGNOSIS — F330 Major depressive disorder, recurrent, mild: Secondary | ICD-10-CM | POA: Diagnosis not present

## 2021-01-06 DIAGNOSIS — F902 Attention-deficit hyperactivity disorder, combined type: Secondary | ICD-10-CM | POA: Diagnosis not present

## 2021-01-13 ENCOUNTER — Ambulatory Visit (INDEPENDENT_AMBULATORY_CARE_PROVIDER_SITE_OTHER): Payer: Medicare HMO | Admitting: Psychology

## 2021-01-13 DIAGNOSIS — F902 Attention-deficit hyperactivity disorder, combined type: Secondary | ICD-10-CM | POA: Diagnosis not present

## 2021-01-13 DIAGNOSIS — F330 Major depressive disorder, recurrent, mild: Secondary | ICD-10-CM

## 2021-01-13 DIAGNOSIS — F411 Generalized anxiety disorder: Secondary | ICD-10-CM

## 2021-01-13 DIAGNOSIS — F4312 Post-traumatic stress disorder, chronic: Secondary | ICD-10-CM

## 2021-01-20 ENCOUNTER — Ambulatory Visit (INDEPENDENT_AMBULATORY_CARE_PROVIDER_SITE_OTHER): Payer: Medicare HMO | Admitting: Psychology

## 2021-01-20 DIAGNOSIS — F4312 Post-traumatic stress disorder, chronic: Secondary | ICD-10-CM | POA: Diagnosis not present

## 2021-01-20 NOTE — Plan of Care (Cosign Needed)
Treatment Plan   Client Abilities/Strengths  Insightful, persevering attitude, willingness to reach out for help, and psychology knowledge.    Client Treatment Preferences  Individual therapy on a weekly-biweekly basis.    Client Statement of Needs  Courtney Trevino reported a desire to use mental health services to challenge core beliefs, process her emotional experience, learn additional coping strategies, and improve sleep quality.    Treatment Level  Outpatient individual therapy  Symptoms  Experiences frequent nightmares.: No Description Entered (Status: maintained). Has been exposed to a traumatic event involving actual or perceived threat of death or serious injury.: No Description Entered (Status: maintained). Reports response of intense fear, helplessness, or horror to the traumatic event.: No Description Entered (Status: maintained).  Problems Addressed  Posttraumatic Stress Disorder (PTSD)  Goals 1. Eliminate or reduce the negative impact trauma related symptoms have on social, occupational, and family functioning. Objective Routinely use discussed therapeutic skills during stressors, relational issues, and/or trauma reminders. Target Date: 2022-01-15        Frequency: Weekly Progress: 0      Modality: individual Objective Verbalize an understanding of the treatment rationale for PTSD. Target Date: 2022-01-15        Frequency: Weekly Progress: 100  Modality: individual Related Interventions 1.         Educate the client about how effective treatments for PTSD help address the cognitive, emotional, and behavioral consequences of PTSD using cognitive and behavioral therapy approaches. Objective Learn and implement calming skills. Target Date: 2022-01-15        Frequency: Weekly Progress: 80    Modality: individual Related Interventions 1.         Teach the client calming skills (e.g., breathing retraining, relaxation, calming self-talk) to use in and between sessions when feeling  overly distressed. Objective Participate in Cognitive Processing Therapy to process the trauma and reduce its impact. Target Date: 2022-01-15        Frequency: Weekly Progress: 80    Modality: individual Related Interventions 1.         Use a Cognitive Processing Therapy approach beginning with assigning the client to write a description of the meaning of the traumatic event (i.e., the impact statement); ask the client to read and discuss the impact statement Objective Learn and implement personal skills to manage challenging situations related to trauma. Target Date: 2022-01-15        Frequency: Weekly Progress: 90    Modality: individual Related Interventions 1.         Use techniques from Stress Inoculation Training (e.g., covert modeling [i.e., imagining the successful use of the strategies], role-play, practice, and generalization training) to teach the client tailored skills (e.g., calming and coping skills) for managing fears, overcoming avoidance, and increasing present-day adaptation (see Clinical Handbook/Practical Therapist Manual for Assessing and Treating Adults with Posttraumatic Stress Disorder (PTSD) by Meichenbaum). Objective Participate in Acceptance and Commitment Therapy (ACT) to reduce the impact of the traumatic event. Target Date: 2021-01-15        Frequency: Weekly Progress: 80    Modality: individual Related Interventions 1.         Use an ACT approach to PTSD to help the client experience and accept the presence of troubling thoughts and images without being overly impacted by them, and committing his/her time and efforts to activities that are consistent with identified, personally meaningful values ( Diagnosis Axis none        F43.10 (Posttraumatic stress disorder) - Open - [Signifier: n/a]  Posttraumatic Stress Disorder  Conditions For Discharge Achievement of treatment goals and objectives
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
## 2021-01-20 NOTE — Progress Notes (Addendum)
Date: January 20, 2021    Appointment Start Time: 8:02-8:57am Duration: 55 minutes Provider: Helmut Muster, PsyD Type of Session: Individual Therapy  Location of Patient: Home (private location) Location of Provider: Provider's Home (private office) Type of Contact: WebEx video visit with audio  Session Content: Today's appointment was a telepsychological visit due to COVID-19. Courtney Trevino is aware it is her responsibility to secure confidentiality on her end of the session. She provided verbal consent to proceed with today's appointment. Prior to proceeding with today's appointment, Courtney Trevino's physical location at the time of this appointment was obtained as well a phone number she could be reached at in the event of technical difficulties.   This provider conducted a check-in. Courtney Trevino endorsed an ongoing improved mood. She discussed examples of value congruent actions that include spending time with friends, plans to write her brother a letter, and use of gratitude and mindfulness. This Clinical research associate assisted her in processing her emotional experiences and identifying her values. She noted how she has been engaging in value congruent actions through reminding herself of the serenity prayer and making conscious decisions on how to spend her energy. This Clinical research associate reflected and reinforced her efforts as well as provided psychoeducation on values and psychological pain versus suffering. She denied having any other issues or concerns.   Courtney Trevino was receptive to today's appointment as evidenced by openness to sharing and responsiveness to feedback.  Mental Status Examination: Appearance:  neat Behavior: appropriate to circumstances Mood: euthymic Affect: mood congruent Speech: WNL Eye Contact: appropriate Psychomotor Activity: WNL Thought Process: linear, logical, and goal directed and denies suicidal, homicidal, and self-harm ideation, plan and intent Content/Perceptual Disturbances: none Orientation:  AAOx4 Cognition/Sensorium: intact Insight: good Judgment: good  Interventions:  Provided positive reinforcement Employed motivational interviewing skills to assess patient's willingness/desire to adhere to recommended medical treatments and assignments Engaged patient in problem solving Psychoeducation provided regarding self-care Employed acceptance and commitment interventions to emphasize mindfulness and acceptance without struggle Explored patient's values   DSM-5 Diagnosis(es):  F43.12 PTSD, Chronic  Treatment Goal & Progress:  1. Related Problem: Eliminate or reduce the negative impact trauma related symptoms have on  social, occupational, and family functioning. Description: Routinely use discussed therapeutic skills during stressors, relational issues,  and/or trauma reminders. Target Date: 2022-01-15 Frequency: Weekly Modality: individual Progress: 90%  2. Related Problem: Eliminate or reduce the negative impact trauma related symptoms have on  social, occupational, and family functioning. Description: Participate in Acceptance and Commitment Therapy (ACT) to reduce the impact  of the traumatic event. Target Date: 2022-01-15 Frequency: Weekly Modality: individual Progress: 90% ? Planned Intervention: Use an ACT approach to PTSD to help the client experience and  accept the presence of troubling thoughts and images without being overly impacted by  them, and committing his/her time and efforts to activities that are consistent with  identified, personally meaningful values. 3. Related Problem: Eliminate or reduce the negative impact trauma related symptoms have on social, occupational, and family functioning. Description: Learn and implement personal skills to manage challenging situations related to trauma. Target Date: 2022-01-15 Frequency: Weekly Modality: individual Progress: 100% 4. Related Problem: Eliminate or reduce the negative impact trauma related  symptoms have on  social, occupational, and family functioning. Description: Participate in Cognitive Processing Therapy to process the trauma and reduce its  impact. Target Date: 2022-01-15 Frequency: Weekly Modality: individual Progress: 90% 5. Related Problem: Eliminate or reduce the negative impact trauma related symptoms have on  social, occupational, and family functioning. Description: Verbalize  an understanding of the treatment rationale for PTSD. Target Date: 2022-01-15 Frequency: Weekly Modality: individual Progress: 100% 6. Related Problem: Eliminate or reduce the negative impact trauma related symptoms have on  social, occupational, and family functioning. Description: Learn and implement calming skills. Target Date: 2022-01-15 Frequency: Weekly Modality: individual Progress: 90% ? Planned Intervention: Teach the client calming skills (e.g., breathing retraining, relaxation,calming self-talk) to use in and between sessions when feeling overly distressed.  Plan: Courtney Trevino is currently scheduled for a follow-up appointment on 02/03/2021 at 8am via WebEx video visit with audio. The next session will focus on continuing to identify and assist her in engaging in value congruent actions.

## 2021-01-20 NOTE — Plan of Care (Signed)
Treatment Plan  Client Abilities/Strengths  Insightful, persevering attitude, willingness to reach out for help, and psychology knowledge.   Client Treatment Preferences  Individual therapy on a weekly-biweekly basis.   Client Statement of Needs  Courtney Trevino reported a desire to use mental health services to challenge core beliefs, process her emotional experience, learn additional coping strategies, and improve sleep quality.   Treatment Level  Outpatient individual therapy  Symptoms  Experiences frequent nightmares.: No Description Entered (Status: maintained). Has been exposed to a traumatic event involving actual or perceived threat of death or serious injury.: No Description Entered (Status: maintained). Reports response of intense fear, helplessness, or horror to the traumatic event.: No Description Entered (Status: maintained).  Problems Addressed  Posttraumatic Stress Disorder (PTSD)  Goals 1. Eliminate or reduce the negative impact trauma related symptoms have on social, occupational, and family functioning. Objective Routinely use discussed therapeutic skills during stressors, relational issues, and/or trauma reminders. Target Date: 2022-01-15 Frequency: Weekly Progress: 0 Modality: individual Objective Verbalize an understanding of the treatment rationale for PTSD. Target Date: 2022-01-15 Frequency: Weekly Progress: 100 Modality: individual Related Interventions 1. Educate the client about how effective treatments for PTSD help address the cognitive, emotional, and behavioral consequences of PTSD using cognitive and behavioral therapy approaches. Objective Learn and implement calming skills. Target Date: 2022-01-15 Frequency: Weekly Progress: 80 Modality: individual Related Interventions 1. Teach the client calming skills (e.g., breathing retraining, relaxation, calming self-talk) to use in and between sessions when feeling overly distressed. Objective Participate in  Cognitive Processing Therapy to process the trauma and reduce its impact. Target Date: 2022-01-15 Frequency: Weekly Progress: 80 Modality: individual Related Interventions 1. Use a Cognitive Processing Therapy approach beginning with assigning the client to write a description of the meaning of the traumatic event (i.e., the impact statement); ask the client to read and discuss the impact statement Objective Learn and implement personal skills to manage challenging situations related to trauma. Target Date: 2022-01-15 Frequency: Weekly Progress: 90 Modality: individual Related Interventions 1. Use techniques from Stress Inoculation Training (e.g., covert modeling [i.e., imagining the successful use of the strategies], role-play, practice, and generalization training) to teach the client tailored skills (e.g., calming and coping skills) for managing fears, overcoming avoidance, and increasing present-day adaptation (see Clinical Handbook/Practical Therapist Manual for Assessing and Treating Adults with Posttraumatic Stress Disorder (PTSD) by Meichenbaum). Objective Participate in Acceptance and Commitment Therapy (ACT) to reduce the impact of the traumatic event. Target Date: 2021-01-15 Frequency: Weekly Progress: 80 Modality: individual Related Interventions 1. Use an ACT approach to PTSD to help the client experience and accept the presence of troubling thoughts and images without being overly impacted by them, and committing his/her time and efforts to activities that are consistent with identified, personally meaningful values ( Diagnosis Axis none F43.10 (Posttraumatic stress disorder) - Open - [Signifier: n/a]  Posttraumatic Stress Disorder  Conditions For Discharge Achievement of treatment goals and objectives

## 2021-01-21 NOTE — Progress Notes (Deleted)
Date: January 21, 2021  ***  Appointment Start Time: *** Duration: *** minutes Provider: Helmut Muster, PsyD Type of Session: Individual Therapy  Location of Patient: Home (private location) Location of Provider: Provider's Home (private office) Type of Contact: WebEx video visit with audio  Session Content: Today's appointment was a telepsychological visit due to COVID-19. Courtney Trevino is aware it is her responsibility to secure confidentiality on her end of the session. She provided verbal consent to proceed with today's appointment. Prior to proceeding with today's appointment, [name]'s physical location at the time of this appointment was obtained as well a phone number she could be reached at in the event of technical difficulties.   This provider conducted a check-in. ***  [name] was receptive to today's appointment as evidenced by openness to sharing and responsiveness to feedback.  Mental Status Examination: Appearance: {Apperance:26678} Behavior: {Behavior:26679} Mood: {Mood:26680} Affect: {Affect:26681} Speech: {Speech:26682} Eye Contact: {Eye contact:26683} Psychomotor Activity: {Psychomotor Activity:26684} Thought Process: {Thought Process:26685} Content/Perceptual Disturbances: {Content & Perecptual Disturbances:26686} Orientation: {Orientation:26687} Cognition/Sensorium: {Cognition & Sensorium:26688} Insight: {Insight & Judgment:26689} Judgment: {Insight & Judgment:26689}  Interventions:  {Interventions for Progress Notes:23405}  DSM-5 Diagnosis(es): F90.2 Attention-Deficit/Hyperactivity Disorder, combined type  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 The next session will focus on ***.

## 2021-01-27 ENCOUNTER — Ambulatory Visit: Payer: Medicare HMO | Admitting: Psychology

## 2021-02-03 ENCOUNTER — Ambulatory Visit (INDEPENDENT_AMBULATORY_CARE_PROVIDER_SITE_OTHER): Payer: Medicare HMO | Admitting: Psychology

## 2021-02-03 DIAGNOSIS — F4312 Post-traumatic stress disorder, chronic: Secondary | ICD-10-CM

## 2021-02-03 NOTE — Progress Notes (Signed)
Date: February 03, 2021    Appointment Start Time: 8am Duration: 58 minutes Provider: Helmut Muster, PsyD Type of Session: Individual Therapy  Location of Patient: Home (private location) Location of Provider: Provider's Home (private office) Type of Contact: WebEx video visit with audio  Session Content: Today's appointment was a telepsychological visit due to COVID-19. Courtney Trevino is aware it is her responsibility to secure confidentiality on her end of the session. She provided verbal consent to proceed with today's appointment. Prior to proceeding with today's appointment, Courtney Trevino's physical location at the time of this appointment was obtained as well a phone number she could be reached at in the event of technical difficulties.   This provider conducted a check-in. Courtney Trevino endorsed grief, discussing how another one of her nephews died by suicide. This Clinical research associate assisted her in processing and normalizing her emotional experiences. She described how she is sad and frustrated with the situation, discussing examples of her efforts to address mental health concerns in her family and not feeling heard. She stated she plans to not go to the funeral as it is likely to exacerbate her frustration. This Clinical research associate assisted her in exploring her values and formulating a plan to cope with grief over the coming days. She concluded not attending the funeral is value congruent and that she plans to continue spending time with friends, using the serenity prayer and gratitude, and utilizing grounding techniques. She denied having any other issues or concerns.   [name] was receptive to today's appointment as evidenced by openness to sharing and responsiveness to feedback.  Mental Status Examination: Appearance:  neat Behavior: appropriate to circumstances Mood: sad Affect: mood congruent Speech: WNL Eye Contact: appropriate Psychomotor Activity: WNL Thought Process: linear, logical, and goal directed and denies  suicidal, homicidal, and self-harm ideation, plan and intent Content/Perceptual Disturbances: none Orientation: AAOx4 Cognition/Sensorium: intact Insight: good Judgment: good  Interventions:  Provided empathic reflections and validation Provided positive reinforcement Employed supportive psychotherapy interventions to facilitate reduced distress and to improve coping skills with identified stressors Engaged patient in problem solving Psychoeducation provided regarding self-care Employed acceptance and commitment interventions to emphasize mindfulness and acceptance without struggle Explored patient's values   DSM-5 Diagnosis(es): F43.12 PTSD, chronic  Plan: [name] is currently scheduled for a follow-up appointment on 02/10/2021 at 8am via WebEx video visit with audio. The next session will focus on value congruent actions.   Treatment Goal & Progress:  1. Related Problem: Eliminate or reduce the negative impact trauma related symptoms have on  social, occupational, and family functioning. Description: Routinely use discussed therapeutic skills during stressors, relational issues,  and/or trauma reminders. Target Date: 2022-01-15 Frequency: Weekly Modality: individual Progress: 90%  2. Related Problem: Eliminate or reduce the negative impact trauma related symptoms have on  social, occupational, and family functioning. Description: Participate in Acceptance and Commitment Therapy (ACT) to reduce the impact  of the traumatic event. Target Date: 2022-01-15 Frequency: Weekly Modality: individual Progress: 90% ? Planned Intervention: Use an ACT approach to PTSD to help the client experience and  accept the presence of troubling thoughts and images without being overly impacted by  them, and committing his/her time and efforts to activities that are consistent with  identified, personally meaningful values. 3. Related Problem: Eliminate or reduce the negative impact trauma  related symptoms have on social, occupational, and family functioning. Description: Learn and implement personal skills to manage challenging situations related to trauma. Target Date: 2022-01-15 Frequency: Weekly Modality: individual Progress: 100% 4. Related Problem: Eliminate or  reduce the negative impact trauma related symptoms have on  °social, occupational, and family functioning. °Description: Participate in Cognitive Processing Therapy to process the trauma and reduce its  °impact. °Target Date: 2022-01-15 °Frequency: Weekly °Modality: individual °Progress: 90% °5. Related Problem: Eliminate or reduce the negative impact trauma related symptoms have on  °social, occupational, and family functioning. °Description: Verbalize an understanding of the treatment rationale for PTSD. °Target Date: 2022-01-15 °Frequency: Weekly °Modality: individual °Progress: 100% °6. Related Problem: Eliminate or reduce the negative impact trauma related symptoms have on  °social, occupational, and family functioning. °Description: Learn and implement calming skills. °Target Date: 2022-01-15 °Frequency: Weekly °Modality: individual °Progress: 90% °? Planned Intervention: Teach the client calming skills (e.g., breathing retraining, relaxation,calming self-talk) to use in and between sessions when feeling overly distressed. ° °

## 2021-02-03 NOTE — Progress Notes (Addendum)
Date: 02/10/2021  °Appointment Start Time: 8am  °Duration: 56 minutes °Provider: Wilhemenia Camba, PsyD °Type of Session: Individual Therapy  °Location of Patient: Home (private location) °Location of Provider: Provider's Home (private office) °Type of Contact: WebEx video visit with audio ° °Session Content: Today's appointment was a telepsychological visit due to COVID-19. Courtney Trevino is aware it is her responsibility to secure confidentiality on her end of the session. She provided verbal consent to proceed with today's appointment. Prior to proceeding with today's appointment, Courtney Trevino's physical location at the time of this appointment was obtained as well a phone number she could be reached at in the event of technical difficulties.  ° °This provider conducted a check-in. Courtney Trevino reported an overall improved mood, discussed examples of a family social gathering going well, setting boundaries and using assertive communication with others, utilizing acceptance and working to change the things she can, and being increasingly emotionally vulnerable. This writer reflected and reinforced her efforts and provided psychoeducation on assertive communication and values. She expressed uncertainty on how to proceed with two friend's gift giving behavior. This writer assisted her in processing her emotional experiences and problem solving the situation. She concluded with plans to remind herself it is how they show care, that past experiences with gift giving are causing the current discomfort, and to continue expressing their time with her is the best gift. She denied having any other issues or concerns.  ° °Courtney Trevino was receptive to today's appointment as evidenced by openness to sharing and responsiveness to feedback. ° °Mental Status Examination: °Appearance:  neat °Behavior: appropriate to circumstances °Mood: euthymic °Affect: mood congruent °Speech: WNL °Eye Contact: appropriate °Psychomotor Activity: WNL °Thought  Process: linear, logical, and goal directed °Content/Perceptual Disturbances: none °Orientation: AAOx4 °Cognition/Sensorium: intact °Insight: good °Judgment: good ° °Interventions:  °Provided empathic reflections and validation °Provided positive reinforcement °Employed supportive psychotherapy interventions to facilitate reduced distress and to improve coping skills with identified stressors °Employed motivational interviewing skills to assess patient's willingness/desire to adhere to recommended medical treatments and assignments °Engaged patient in problem solving °Psychoeducation provided regarding all-or-nothing thinking °Psychoeducation provided regarding self-care °Psychoeducation provided regarding values °Explored patient's values  °Employed acceptance and commitment interventions to emphasize mindfulness, acceptance without struggle, as well as value guided actions °Reviewed learned skills ° °DSM-5 Diagnosis(es): F43.12 PTSD, chronic ° °Plan: Courtney Trevino is currently scheduled for a follow-up appointment on 02/18/2020 at 8am via WebEx video visit with audio. The next session will focus on value congruent actions and acceptance.  ° °Treatment Goal & Progress:  °1. Related Problem: Eliminate or reduce the negative impact trauma related symptoms have on  °social, occupational, and family functioning. °Description: Routinely use discussed therapeutic skills during stressors, relational issues,  °and/or trauma reminders. °Target Date: 2022-01-15 °Frequency: Weekly °Modality: individual °Progress: 90%  °2. Related Problem: Eliminate or reduce the negative impact trauma related symptoms have on  °social, occupational, and family functioning. °Description: Participate in Acceptance and Commitment Therapy (ACT) to reduce the impact  °of the traumatic event. °Target Date: 2022-01-15 °Frequency: Weekly °Modality: individual °Progress: 90% °? Planned Intervention: Use an ACT approach to PTSD to help the client experience  and  °accept the presence of troubling thoughts and images without being overly impacted by  °them, and committing his/her time and efforts to activities that are consistent with  °identified, personally meaningful values. °3. Related Problem: Eliminate or reduce the negative impact trauma related symptoms have on social, occupational, and family functioning. °Description: Learn and implement personal skills to manage   challenging situations related to trauma. Target Date: 2022-01-15 Frequency: Weekly Modality: individual Progress: 100% 4. Related Problem: Eliminate or reduce the negative impact trauma related symptoms have on  social, occupational, and family functioning. Description: Participate in Cognitive Processing Therapy to process the trauma and reduce its  impact. Target Date: 2022-01-15 Frequency: Weekly Modality: individual Progress: 100% 5. Related Problem: Eliminate or reduce the negative impact trauma related symptoms have on  social, occupational, and family functioning. Description: Verbalize an understanding of the treatment rationale for PTSD. Target Date: 2022-01-15 Frequency: Weekly Modality: individual Progress: 100% 6. Related Problem: Eliminate or reduce the negative impact trauma related symptoms have on  social, occupational, and family functioning. Description: Learn and implement calming skills. Target Date: 2022-01-15 Frequency: Weekly Modality: individual Progress: 90% ? Planned Intervention: Teach the client calming skills (e.g., breathing retraining, relaxation,calming self-talk) to use in and between sessions when feeling overly distressed.

## 2021-02-10 ENCOUNTER — Ambulatory Visit (INDEPENDENT_AMBULATORY_CARE_PROVIDER_SITE_OTHER): Payer: Medicare HMO | Admitting: Psychology

## 2021-02-10 DIAGNOSIS — F4312 Post-traumatic stress disorder, chronic: Secondary | ICD-10-CM | POA: Diagnosis not present

## 2021-02-16 NOTE — Progress Notes (Signed)
Date: February 03, 2021    Appointment Start Time: 8am Duration: 58 minutes Provider: Helmut Muster, PsyD Type of Session: Individual Therapy  Location of Patient: Home (private location) Location of Provider: Provider's Home (private office) Type of Contact: WebEx video visit with audio  Session Content: Today's appointment was a telepsychological visit due to COVID-19. Craig is aware it is her responsibility to secure confidentiality on her end of the session. She provided verbal consent to proceed with today's appointment. Prior to proceeding with today's appointment, Raena's physical location at the time of this appointment was obtained as well a phone number she could be reached at in the event of technical difficulties.   This provider conducted a check-in. [name] endorsed grief, discussing how another one of her nephews died by suicide. This Clinical research associate assisted her in processing and normalizing her emotional experiences. She described how she is sad and frustrated with the situation, discussing examples of her efforts to address mental health concerns in her family and not feeling heard. She stated she plans to not go to the funeral as it is likely to exacerbate her frustration. This Clinical research associate assisted her in exploring her values and formulating a plan to cope with grief over the coming days. She concluded not attending the funeral is value congruent and that she plans to continue spending time with friends, using the serenity prayer and gratitude, and utilizing grounding techniques. She denied having any other issues or concerns.   [name] was receptive to today's appointment as evidenced by openness to sharing and responsiveness to feedback.  Mental Status Examination: Appearance:  neat Behavior: appropriate to circumstances Mood: sad Affect: mood congruent Speech: WNL Eye Contact: appropriate Psychomotor Activity: WNL Thought Process: linear, logical, and goal directed and denies  suicidal, homicidal, and self-harm ideation, plan and intent Content/Perceptual Disturbances: none Orientation: AAOx4 Cognition/Sensorium: intact Insight: good Judgment: good  Interventions:  Provided empathic reflections and validation Provided positive reinforcement Employed supportive psychotherapy interventions to facilitate reduced distress and to improve coping skills with identified stressors Engaged patient in problem solving Psychoeducation provided regarding self-care Employed acceptance and commitment interventions to emphasize mindfulness and acceptance without struggle Explored patient's values   DSM-5 Diagnosis(es): F43.12 PTSD, chronic  Plan: [name] is currently scheduled for a follow-up appointment on 02/10/2021 at 8am via WebEx video visit with audio. The next session will focus on value congruent actions.   Treatment Goal & Progress:  1. Related Problem: Eliminate or reduce the negative impact trauma related symptoms have on  social, occupational, and family functioning. Description: Routinely use discussed therapeutic skills during stressors, relational issues,  and/or trauma reminders. Target Date: 2022-01-15 Frequency: Weekly Modality: individual Progress: 90%  2. Related Problem: Eliminate or reduce the negative impact trauma related symptoms have on  social, occupational, and family functioning. Description: Participate in Acceptance and Commitment Therapy (ACT) to reduce the impact  of the traumatic event. Target Date: 2022-01-15 Frequency: Weekly Modality: individual Progress: 90% ? Planned Intervention: Use an ACT approach to PTSD to help the client experience and  accept the presence of troubling thoughts and images without being overly impacted by  them, and committing his/her time and efforts to activities that are consistent with  identified, personally meaningful values. 3. Related Problem: Eliminate or reduce the negative impact trauma  related symptoms have on social, occupational, and family functioning. Description: Learn and implement personal skills to manage challenging situations related to trauma. Target Date: 2022-01-15 Frequency: Weekly Modality: individual Progress: 100% 4. Related Problem: Eliminate or  reduce the negative impact trauma related symptoms have on  social, occupational, and family functioning. Description: Participate in Cognitive Processing Therapy to process the trauma and reduce its  impact. Target Date: 2022-01-15 Frequency: Weekly Modality: individual Progress: 90% 5. Related Problem: Eliminate or reduce the negative impact trauma related symptoms have on  social, occupational, and family functioning. Description: Verbalize an understanding of the treatment rationale for PTSD. Target Date: 2022-01-15 Frequency: Weekly Modality: individual Progress: 100% 6. Related Problem: Eliminate or reduce the negative impact trauma related symptoms have on  social, occupational, and family functioning. Description: Learn and implement calming skills. Target Date: 2022-01-15 Frequency: Weekly Modality: individual Progress: 90% ? Planned Intervention: Teach the client calming skills (e.g., breathing retraining, relaxation,calming self-talk) to use in and between sessions when feeling overly distressed.

## 2021-02-16 NOTE — Progress Notes (Addendum)
Date: January 20, 2021    Appointment Start Time: 8:02-8:57am Duration: 55 minutes Provider: Helmut Muster, PsyD Type of Session: Individual Therapy  Location of Patient: Home (private location) Location of Provider: Provider's Home (private office) Type of Contact: WebEx video visit with audio  Session Content: Today's appointment was a telepsychological visit due to COVID-19. Courtney Trevino is aware it is her responsibility to secure confidentiality on her end of the session. She provided verbal consent to proceed with today's appointment. Prior to proceeding with today's appointment, Courtney Trevino physical location at the time of this appointment was obtained as well a phone number she could be reached at in the event of technical difficulties.   This provider conducted a check-in. Ms. Wisenbaker endorsed an ongoing improved mood. She discussed examples of value congruent actions that include spending time with friends, plans to write her brother a letter, and use of gratitude and mindfulness. This Clinical research associate assisted her in processing her emotional experiences and identifying her values. She noted how she has been engaging in value congruent actions through reminding herself of the serenity prayer and making conscious decisions on how to spend her energy. This Clinical research associate reflected and reinforced her efforts as well as provided psychoeducation on values and psychological pain versus suffering. She denied having any other issues or concerns.   Courtney Trevino was receptive to today's appointment as evidenced by openness to sharing and responsiveness to feedback.  Mental Status Examination: Appearance:  neat Behavior: appropriate to circumstances Mood: euthymic Affect: mood congruent Speech: WNL Eye Contact: appropriate Psychomotor Activity: WNL Thought Process: linear, logical, and goal directed and denies suicidal, homicidal, and self-harm ideation, plan and intent Content/Perceptual Disturbances: none Orientation:  AAOx4 Cognition/Sensorium: intact Insight: good Judgment: good  Interventions:  Provided positive reinforcement Employed motivational interviewing skills to assess patient's willingness/desire to adhere to recommended medical treatments and assignments Engaged patient in problem solving Psychoeducation provided regarding self-care Employed acceptance and commitment interventions to emphasize mindfulness and acceptance without struggle Explored patient's values   DSM-5 Diagnosis(es):  F43.12 PTSD, Chronic  Plan: Courtney Trevino is currently scheduled for a follow-up appointment on 02/03/2021 at 8am via WebEx video visit with audio. The next session will focus on continuing to identify and assist her in engaging in value congruent actions.   Treatment Goal & Progress:  1. Related Problem: Eliminate or reduce the negative impact trauma related symptoms have on  social, occupational, and family functioning. Description: Routinely use discussed therapeutic skills during stressors, relational issues,  and/or trauma reminders. Target Date: 2022-01-15 Frequency: Weekly Modality: individual Progress: 90%  2. Related Problem: Eliminate or reduce the negative impact trauma related symptoms have on  social, occupational, and family functioning. Description: Participate in Acceptance and Commitment Therapy (ACT) to reduce the impact  of the traumatic event. Target Date: 2022-01-15 Frequency: Weekly Modality: individual Progress: 90% ? Planned Intervention: Use an ACT approach to PTSD to help the client experience and  accept the presence of troubling thoughts and images without being overly impacted by  them, and committing his/her time and efforts to activities that are consistent with  identified, personally meaningful values. 3. Related Problem: Eliminate or reduce the negative impact trauma related symptoms have on social, occupational, and family functioning. Description: Learn and implement  personal skills to manage challenging situations related to trauma. Target Date: 2022-01-15 Frequency: Weekly Modality: individual Progress: 100% 4. Related Problem: Eliminate or reduce the negative impact trauma related symptoms have on  social, occupational, and family functioning. Description: Participate in Cognitive Processing Therapy to  process the trauma and reduce its  impact. Target Date: 2022-01-15 Frequency: Weekly Modality: individual Progress: 90% 5. Related Problem: Eliminate or reduce the negative impact trauma related symptoms have on  social, occupational, and family functioning. Description: Verbalize an understanding of the treatment rationale for PTSD. Target Date: 2022-01-15 Frequency: Weekly Modality: individual Progress: 100% 6. Related Problem: Eliminate or reduce the negative impact trauma related symptoms have on  social, occupational, and family functioning. Description: Learn and implement calming skills. Target Date: 2022-01-15 Frequency: Weekly Modality: individual Progress: 90% ? Planned Intervention: Teach the client calming skills (e.g., breathing retraining, relaxation,calming self-talk) to use in and between sessions when feeling overly distressed.   The patient was agreeable with the treatment plan, and verbally consented to proceed with it.

## 2021-02-16 NOTE — Progress Notes (Signed)
Date: 02/10/2021  Appointment Start Time: 8am  Duration: 56 minutes Provider: Clarice Pole, PsyD Type of Session: Individual Therapy  Location of Patient: Home (private location) Location of Provider: Provider's Home (private office) Type of Contact: WebEx video visit with audio  Session Content: Today's appointment was a telepsychological visit due to COVID-19. Courtney Trevino is aware it is her responsibility to secure confidentiality on her end of the session. She provided verbal consent to proceed with today's appointment. Prior to proceeding with today's appointment, Courtney Trevino's physical location at the time of this appointment was obtained as well a phone number she could be reached at in the event of technical difficulties.   This provider conducted a check-in. Courtney Trevino reported an overall improved mood, discussed examples of a family social gathering going well, setting boundaries and using assertive communication with others, utilizing acceptance and working to change the things she can, and being increasingly emotionally vulnerable. This Probation officer reflected and reinforced her efforts and provided psychoeducation on assertive communication and values. She expressed uncertainty on how to proceed with two friend's gift giving behavior. This Probation officer assisted her in processing her emotional experiences and problem solving the situation. She concluded with plans to remind herself it is how they show care, that past experiences with gift giving are causing the current discomfort, and to continue expressing their time with her is the best gift. She denied having any other issues or concerns.   Courtney Trevino was receptive to today's appointment as evidenced by openness to sharing and responsiveness to feedback.  Mental Status Examination: Appearance:  neat Behavior: appropriate to circumstances Mood: euthymic Affect: mood congruent Speech: WNL Eye Contact: appropriate Psychomotor Activity: WNL Thought  Process: linear, logical, and goal directed Content/Perceptual Disturbances: none Orientation: AAOx4 Cognition/Sensorium: intact Insight: good Judgment: good  Interventions:  Provided empathic reflections and validation Provided positive reinforcement Employed supportive psychotherapy interventions to facilitate reduced distress and to improve coping skills with identified stressors Employed motivational interviewing skills to assess patient's willingness/desire to adhere to recommended medical treatments and assignments Engaged patient in problem solving Psychoeducation provided regarding all-or-nothing thinking Psychoeducation provided regarding self-care Psychoeducation provided regarding values Explored patient's values  Employed acceptance and commitment interventions to emphasize mindfulness, acceptance without struggle, as well as value guided actions Reviewed learned skills  DSM-5 Diagnosis(es): F43.12 PTSD, chronic  Plan: Courtney Trevino is currently scheduled for a follow-up appointment on 02/18/2020 at 8am via WebEx video visit with audio. The next session will focus on value congruent actions and acceptance.   Treatment Goal & Progress:  1. Related Problem: Eliminate or reduce the negative impact trauma related symptoms have on  social, occupational, and family functioning. Description: Routinely use discussed therapeutic skills during stressors, relational issues,  and/or trauma reminders. Target Date: 2022-01-15 Frequency: Weekly Modality: individual Progress: 90%  2. Related Problem: Eliminate or reduce the negative impact trauma related symptoms have on  social, occupational, and family functioning. Description: Participate in Acceptance and Commitment Therapy (ACT) to reduce the impact  of the traumatic event. Target Date: 2022-01-15 Frequency: Weekly Modality: individual Progress: 90% ? Planned Intervention: Use an ACT approach to PTSD to help the client experience  and  accept the presence of troubling thoughts and images without being overly impacted by  them, and committing his/her time and efforts to activities that are consistent with  identified, personally meaningful values. 3. Related Problem: Eliminate or reduce the negative impact trauma related symptoms have on social, occupational, and family functioning. Description: Learn and implement personal skills to manage  challenging situations related to trauma. Target Date: 2022-01-15 Frequency: Weekly Modality: individual Progress: 100% 4. Related Problem: Eliminate or reduce the negative impact trauma related symptoms have on  social, occupational, and family functioning. Description: Participate in Cognitive Processing Therapy to process the trauma and reduce its  impact. Target Date: 2022-01-15 Frequency: Weekly Modality: individual Progress: 100% 5. Related Problem: Eliminate or reduce the negative impact trauma related symptoms have on  social, occupational, and family functioning. Description: Verbalize an understanding of the treatment rationale for PTSD. Target Date: 2022-01-15 Frequency: Weekly Modality: individual Progress: 100% 6. Related Problem: Eliminate or reduce the negative impact trauma related symptoms have on  social, occupational, and family functioning. Description: Learn and implement calming skills. Target Date: 2022-01-15 Frequency: Weekly Modality: individual Progress: 90% ? Planned Intervention: Teach the client calming skills (e.g., breathing retraining, relaxation,calming self-talk) to use in and between sessions when feeling overly distressed.

## 2021-02-17 ENCOUNTER — Ambulatory Visit (INDEPENDENT_AMBULATORY_CARE_PROVIDER_SITE_OTHER): Payer: Medicare HMO | Admitting: Psychology

## 2021-02-17 DIAGNOSIS — F4312 Post-traumatic stress disorder, chronic: Secondary | ICD-10-CM

## 2021-02-17 NOTE — Progress Notes (Signed)
Date: 02/17/2021 Appointment Start Time: 8am Duration: 60 minutes Provider: Clarice Pole, PsyD Type of Session: Individual Therapy  Location of Patient: Home (private location) Location of Provider: Provider's Home (private office) Type of Contact: WebEx video visit with audio  Session Content: Today's appointment was a telepsychological visit due to COVID-19. Manuella is aware it is her responsibility to secure confidentiality on her end of the session. She provided verbal consent to proceed with today's appointment. Prior to proceeding with today's appointment, Cletus's physical location at the time of this appointment was obtained as well a phone number she could be reached at in the event of technical difficulties.   This provider conducted a check-in. Ms. Jurs endorsed an ongoing overall improved mood despite relational stressors and a family member's passing. This Probation officer assisted her in processing her emotional experiences. She discussed examples of how she has been considering her values, increasingly implementing self-care, setting and maintaining boundaries with others, and utilizing an accepting attitude. This Probation officer reflected and reinforced her efforts as well as provided psychoeducation on self-care and acceptance. This Probation officer also assisted her in formulating goals for the coming weeks. She stated goals of increasingly engaging in social activities with others, noting she plans to go to a concert with a friend for her birthday. She denied having any other issues or concerns.   Pessy was receptive to today's appointment as evidenced by openness to sharing and responsiveness to feedback.  Mental Status Examination: Appearance:  neat Behavior: appropriate to circumstances Mood: sad Affect: mood congruent Speech: WNL Eye Contact: appropriate Psychomotor Activity: WNL Thought Process: linear, logical, and goal directed and denies suicidal, homicidal, and self-harm ideation, plan  and intent Content/Perceptual Disturbances: none Orientation: AAOx4 Cognition/Sensorium: intact Insight: good Judgment: good  Interventions:  Provided empathic reflections and validation Reviewed content from the previous session Provided positive reinforcement Employed supportive psychotherapy interventions to facilitate reduced distress and to improve coping skills with identified stressors Employed motivational interviewing skills to assess patient's willingness/desire to adhere to recommended medical treatments and assignments Employed supportive psychotherapy interventions to reduce distress associated with COVID-19 Engaged patient in goal setting Engaged patient in problem solving Psychoeducation provided regarding cognitive distortions  Psychoeducation provided regarding self-care Explored patient's values   DSM-5 Diagnosis(es):  F43.12 PTSD, Chronic  Plan: Yisela is currently scheduled for a follow-up appointment on 03/03/2020 at 8am via WebEx video visit with audio. The next session will focus on value congruent actions.   Treatment Goal & Progress:  1. Related Problem: Eliminate or reduce the negative impact trauma related symptoms have on  social, occupational, and family functioning. Description: Routinely use discussed therapeutic skills during stressors, relational issues,  and/or trauma reminders. Target Date: 2022-01-15 Frequency: Weekly Modality: individual Progress: 90%  2. Related Problem: Eliminate or reduce the negative impact trauma related symptoms have on  social, occupational, and family functioning. Description: Participate in Acceptance and Commitment Therapy (ACT) to reduce the impact  of the traumatic event. Target Date: 2022-01-15 Frequency: Weekly Modality: individual Progress: 90% ? Planned Intervention: Use an ACT approach to PTSD to help the client experience and  accept the presence of troubling thoughts and images without being overly  impacted by  them, and committing his/her time and efforts to activities that are consistent with  identified, personally meaningful values. 3. Related Problem: Eliminate or reduce the negative impact trauma related symptoms have on social, occupational, and family functioning. Description: Learn and implement personal skills to manage challenging situations related to trauma. Target Date: 2022-01-15 Frequency:  Weekly Modality: individual Progress: 100% 4. Related Problem: Eliminate or reduce the negative impact trauma related symptoms have on  social, occupational, and family functioning. Description: Participate in Cognitive Processing Therapy to process the trauma and reduce its  impact. Target Date: 2022-01-15 Frequency: Weekly Modality: individual Progress: 100% 5. Related Problem: Eliminate or reduce the negative impact trauma related symptoms have on  social, occupational, and family functioning. Description: Verbalize an understanding of the treatment rationale for PTSD. Target Date: 2022-01-15 Frequency: Weekly Modality: individual Progress: 100% 6. Related Problem: Eliminate or reduce the negative impact trauma related symptoms have on  social, occupational, and family functioning. Description: Learn and implement calming skills. Target Date: 2022-01-15 Frequency: Weekly Modality: individual Progress: 90% ? Planned Intervention: Teach the client calming skills (e.g., breathing retraining, relaxation,calming self-talk) to use in and between sessions when feeling overly distressed.

## 2021-02-19 ENCOUNTER — Ambulatory Visit: Payer: Medicare HMO | Admitting: Psychology

## 2021-03-03 ENCOUNTER — Ambulatory Visit (INDEPENDENT_AMBULATORY_CARE_PROVIDER_SITE_OTHER): Payer: Medicare HMO | Admitting: Psychology

## 2021-03-03 DIAGNOSIS — F4312 Post-traumatic stress disorder, chronic: Secondary | ICD-10-CM | POA: Diagnosis not present

## 2021-03-03 NOTE — Progress Notes (Signed)
Date: 03/03/2021  Appointment Start Time: 8am Duration: 57 minutes Provider: Clarice Pole, PsyD Type of Session: Individual Therapy  Location of Patient: Home (private location) Location of Provider: Provider's Home (private office) Type of Contact: WebEx video visit with audio  Session Content: Today's appointment was a telepsychological visit due to COVID-19. Courtney Trevino is aware it is her responsibility to secure confidentiality on her end of the session. She provided verbal consent to proceed with today's appointment. Prior to proceeding with today's appointment, Courtney Trevino's physical location at the time of this appointment was obtained as well a phone number she could be reached at in the event of technical difficulties.   This provider conducted a check-in.  Courtney Trevino endorsed an improved mood, discussing examples of enjoying spending her birthday at a concert, going on a date, an improved relationship with her son, having a good conversation with her brother, some of her family members increasingly hearing her messages and recognizing her efforts, and helping others through crisis.  This Probation officer assisted her in processing her emotional experiences, reflecting and reinforcing her efforts, and formulating value congruent plans. She expressed awareness that obstacles and stressors may occur, but that she is increasingly building a support system through trust and emotional vulnerability, adding how this would be helpful during periods of stress. She concluded with plans to attend [employer] classes and a book club as well as to increasingly engage in self-care. She denied having any other issues or concerns.    Courtney Trevino was receptive to today's appointment as evidenced by openness to sharing and responsiveness to feedback.  Mental Status Examination: Appearance:  neat Behavior: appropriate to circumstances Mood: euthymic Affect: mood congruent Speech: WNL Eye Contact: appropriate Psychomotor  Activity: WNL Thought Process: linear, logical, and goal directed and denies suicidal, homicidal, and self-harm ideation, plan and intent Content/Perceptual Disturbances: none Orientation: AAOx4 Cognition/Sensorium: intact Insight: good Judgment: good  Interventions:  Provided empathic reflections and validation Reviewed content from the previous session Provided positive reinforcement Employed supportive psychotherapy interventions to facilitate reduced distress and to improve coping skills with identified stressors Employed motivational interviewing skills to assess patient's willingness/desire to adhere to recommended medical treatments and assignments Engaged patient in goal setting Psychoeducation provided regarding self-care Explored patient's values   DSM-5 Diagnosis(es): F43.12 PTSD, Chronic  Plan: Courtney Trevino is currently scheduled for a follow-up appointment on 03/17/2021 at 8am via WebEx video visit with audio. The next session will focus on value congruent actions.   Treatment Goal & Progress:  1. Related Problem: Eliminate or reduce the negative impact trauma related symptoms have on  social, occupational, and family functioning. Description: Routinely use discussed therapeutic skills during stressors, relational issues,  and/or trauma reminders. Target Date: 2022-01-15 Frequency: Weekly Modality: individual Progress: 90%  2. Related Problem: Eliminate or reduce the negative impact trauma related symptoms have on  social, occupational, and family functioning. Description: Participate in Acceptance and Commitment Therapy (ACT) to reduce the impact  of the traumatic event. Target Date: 2022-01-15 Frequency: Weekly Modality: individual Progress: 90% ? Planned Intervention: Use an ACT approach to PTSD to help the client experience and  accept the presence of troubling thoughts and images without being overly impacted by  them, and committing his/her time and efforts to  activities that are consistent with  identified, personally meaningful values. 3. Related Problem: Eliminate or reduce the negative impact trauma related symptoms have on social, occupational, and family functioning. Description: Learn and implement personal skills to manage challenging situations related to trauma. Target Date:  2022-01-15 Frequency: Weekly Modality: individual Progress: 100% 4. Related Problem: Eliminate or reduce the negative impact trauma related symptoms have on  social, occupational, and family functioning. Description: Participate in Cognitive Processing Therapy to process the trauma and reduce its  impact. Target Date: 2022-01-15 Frequency: Weekly Modality: individual Progress: 100% 5. Related Problem: Eliminate or reduce the negative impact trauma related symptoms have on  social, occupational, and family functioning. Description: Verbalize an understanding of the treatment rationale for PTSD. Target Date: 2022-01-15 Frequency: Weekly Modality: individual Progress: 100% 6. Related Problem: Eliminate or reduce the negative impact trauma related symptoms have on  social, occupational, and family functioning. Description: Learn and implement calming skills. Target Date: 2022-01-15 Frequency: Weekly Modality: individual Progress: 90% ? Planned Intervention: Teach the client calming skills (e.g., breathing retraining, relaxation,calming self-talk) to use in and between sessions when feeling overly distressed.

## 2021-03-10 ENCOUNTER — Ambulatory Visit: Payer: Medicare HMO | Admitting: Psychology

## 2021-03-17 ENCOUNTER — Ambulatory Visit (INDEPENDENT_AMBULATORY_CARE_PROVIDER_SITE_OTHER): Payer: Medicare HMO | Admitting: Psychology

## 2021-03-17 DIAGNOSIS — F4310 Post-traumatic stress disorder, unspecified: Secondary | ICD-10-CM

## 2021-03-17 NOTE — Progress Notes (Signed)
Date: 03/17/2021 Appointment Start Time: 8:02am Duration: 58 minutes Provider: Helmut Muster, PsyD Type of Session: Individual Therapy  Location of Patient: Home (private location) Location of Provider: Provider's Home (private office) Type of Contact: WebEx video visit with audio  Session Content: Today's appointment was a telepsychological visit due to COVID-19. Courtney Trevino is aware it is her responsibility to secure confidentiality on her end of the session. She provided verbal consent to proceed with today's appointment. Prior to proceeding with today's appointment, Courtney Trevino's physical location at the time of this appointment was obtained as well a phone number she could be reached at in the event of technical difficulties.   This provider conducted a check-in. Courtney Trevino endorsed an ongoing improved mood despite various stressors (e.g., a family member's death and relational strains). This Clinical research associate assisted her in processing her emotional experiences. She discussed ongoing value congruent actions that have included pausing before responding when upset, gratitude, setting boundaries, being honest about her concerns, and attending various social events. This Clinical research associate reflected and reinforced her efforts as well as provided psychoeducation on values and acceptance. She stated a desire to discus show to handle two of her friends "competing" for her attention at the next appointment.  Courtney Trevino was receptive to today's appointment as evidenced by openness to sharing and responsiveness to feedback.  Mental Status Examination: Appearance:  neat Behavior: appropriate to circumstances Mood: euthymic Affect: mood congruent Speech: WNL Eye Contact: appropriate Psychomotor Activity: WNL Thought Process: linear, logical, and goal directed and denies suicidal, homicidal, and self-harm ideation, plan and intent Content/Perceptual Disturbances: none Orientation: AAOx4 Cognition/Sensorium: intact Insight:  good Judgment: good  Interventions:  Provided empathic reflections and validation Reviewed content from the previous session Provided positive reinforcement Employed supportive psychotherapy interventions to facilitate reduced distress and to improve coping skills with identified stressors Engaged patient in goal setting Psychoeducation provided regarding self-care Explored patient's values  Employed acceptance and commitment interventions to emphasize mindfulness, acceptance without struggle, as well as value guided actions Reviewed learned skills  DSM-5 Diagnosis(es): F43.10 Posttraumatic Stress Disorer  Plan: Courtney Trevino is currently scheduled for a follow-up appointment on 03/31/21 at 8am via WebEx video visit with audio. The next session will focus on value congruent actions.   Treatment Goal & Progress:  1. Related Problem: Eliminate or reduce the negative impact trauma related symptoms have on  social, occupational, and family functioning. Description: Routinely use discussed therapeutic skills during stressors, relational issues,  and/or trauma reminders. Target Date: 2022-01-15 Frequency: Weekly Modality: individual Progress: 90%  2. Related Problem: Eliminate or reduce the negative impact trauma related symptoms have on  social, occupational, and family functioning. Description: Participate in Acceptance and Commitment Therapy (ACT) to reduce the impact  of the traumatic event. Target Date: 2022-01-15 Frequency: Weekly Modality: individual Progress: 90% ? Planned Intervention: Use an ACT approach to PTSD to help the client experience and  accept the presence of troubling thoughts and images without being overly impacted by  them, and committing his/her time and efforts to activities that are consistent with  identified, personally meaningful values. 3. Related Problem: Eliminate or reduce the negative impact trauma related symptoms have on social, occupational, and family  functioning. Description: Learn and implement personal skills to manage challenging situations related to trauma. Target Date: 2022-01-15 Frequency: Weekly Modality: individual Progress: 100% 4. Related Problem: Eliminate or reduce the negative impact trauma related symptoms have on  social, occupational, and family functioning. Description: Participate in Cognitive Processing Therapy to process the trauma and reduce its  impact. Target Date: 2022-01-15 Frequency: Weekly Modality: individual Progress: 100% 5. Related Problem: Eliminate or reduce the negative impact trauma related symptoms have on  social, occupational, and family functioning. Description: Verbalize an understanding of the treatment rationale for PTSD. Target Date: 2022-01-15 Frequency: Weekly Modality: individual Progress: 100% 6. Related Problem: Eliminate or reduce the negative impact trauma related symptoms have on  social, occupational, and family functioning. Description: Learn and implement calming skills. Target Date: 2022-01-15 Frequency: Weekly Modality: individual Progress: 90% ? Planned Intervention: Teach the client calming skills (e.g., breathing retraining, relaxation,calming self-talk) to use in and between sessions when feeling overly distressed.   [name], PsyD

## 2021-03-24 ENCOUNTER — Ambulatory Visit: Payer: Medicare HMO | Admitting: Psychology

## 2021-03-31 ENCOUNTER — Ambulatory Visit (INDEPENDENT_AMBULATORY_CARE_PROVIDER_SITE_OTHER): Payer: Medicare HMO | Admitting: Psychology

## 2021-03-31 DIAGNOSIS — F4310 Post-traumatic stress disorder, unspecified: Secondary | ICD-10-CM

## 2021-03-31 NOTE — Progress Notes (Signed)
Date: 03/31/2021 Appointment Start Time: 8am Duration: 56 minutes Provider: Helmut Muster, PsyD Type of Session: Individual Therapy  Location of Patient: Home (private location) Location of Provider: Provider's Home (private office) Type of Contact: WebEx video visit with audio  Session Content: Today's appointment was a telepsychological visit due to COVID-19. Courtney Trevino is aware it is her responsibility to secure confidentiality on her end of the session. She provided verbal consent to proceed with today's appointment. Prior to proceeding with today's appointment, Courtney Trevino's physical location at the time of this appointment was obtained as well a phone number she could be reached at in the event of technical difficulties.   This provider conducted a check-in. Courtney Trevino endorsed a low mood secondary to ongoing recovery from a physical illness, relational issues with others, and forgetfulness and disorientation. This Clinical research associate assisted her in processing emotional experiences, identifying her values and value congruent actions, reinforcing her plans to speak with her doctor about her cognitive concerns that may be related to congestive heart failure. She discussed how she continues to set boundaries with others, have ongoing conversations with her doctor about her concerns, recognizing others behaviors are not personal and it is not her responsibility to change their behaviors, and disengaging from unhelpful relationship dynamics. She denied having any other issues or concerns.   Courtney Trevino was receptive to today's appointment as evidenced by openness to sharing and responsiveness to feedback.  Mental Status Examination: Appearance:  neat Behavior: appropriate to circumstances Mood: sad Affect: mood congruent Speech: WNL Eye Contact: appropriate Psychomotor Activity: WNL Thought Process: linear, logical, and goal directed and denies suicidal, homicidal, and self-harm ideation, plan and  intent Content/Perceptual Disturbances: none Orientation: AAOx4 Cognition/Sensorium: intact Insight: good Judgment: good  Interventions:  Provided empathic reflections and validation Reviewed content from the previous session Provided positive reinforcement Employed supportive psychotherapy interventions to facilitate reduced distress and to improve coping skills with identified stressors Engaged patient in goal setting Engaged patient in problem solving Psychoeducation provided regarding self-care Explored patient's values  Reviewed learned skills  DSM-5 Diagnosis(es):  F43.10 Posttraumatic Stress Disorder  Plan: Courtney Trevino is currently scheduled for a follow-up appointment on 04/14/2021 at 8am via WebEx video visit with audio. The next session will focus on value congruent actions.   Treatment Goal & Progress:  1. Related Problem: Eliminate or reduce the negative impact trauma related symptoms have on  social, occupational, and family functioning. Description: Routinely use discussed therapeutic skills during stressors, relational issues,  and/or trauma reminders. Target Date: 2022-01-15 Frequency: Weekly Modality: individual Progress: 90%  2. Related Problem: Eliminate or reduce the negative impact trauma related symptoms have on  social, occupational, and family functioning. Description: Participate in Acceptance and Commitment Therapy (ACT) to reduce the impact  of the traumatic event. Target Date: 2022-01-15 Frequency: Weekly Modality: individual Progress: 90% ? Planned Intervention: Use an ACT approach to PTSD to help the client experience and  accept the presence of troubling thoughts and images without being overly impacted by  them, and committing his/her time and efforts to activities that are consistent with  identified, personally meaningful values. 3. Related Problem: Eliminate or reduce the negative impact trauma related symptoms have on social, occupational,  and family functioning. Description: Learn and implement personal skills to manage challenging situations related to trauma. Target Date: 2022-01-15 Frequency: Weekly Modality: individual Progress: 100% 4. Related Problem: Eliminate or reduce the negative impact trauma related symptoms have on  social, occupational, and family functioning. Description: Participate in Cognitive Processing Therapy to process  the trauma and reduce its  impact. Target Date: 2022-01-15 Frequency: Weekly Modality: individual Progress: 100% 5. Related Problem: Eliminate or reduce the negative impact trauma related symptoms have on  social, occupational, and family functioning. Description: Verbalize an understanding of the treatment rationale for PTSD. Target Date: 2022-01-15 Frequency: Weekly Modality: individual Progress: 100% 6. Related Problem: Eliminate or reduce the negative impact trauma related symptoms have on  social, occupational, and family functioning. Description: Learn and implement calming skills. Target Date: 2022-01-15 Frequency: Weekly Modality: individual Progress: 90% ? Planned Intervention: Teach the client calming skills (e.g., breathing retraining, relaxation,calming self-talk) to use in and between sessions when feeling overly distressed.  Courtney Trevino was agreeable with the treatment plan, and verbally consented to proceed with it.      Courtney Gouge, PsyD

## 2021-04-07 ENCOUNTER — Ambulatory Visit: Payer: Medicare HMO | Admitting: Psychology

## 2021-04-14 ENCOUNTER — Ambulatory Visit: Payer: Medicare HMO | Admitting: Psychology

## 2021-04-14 ENCOUNTER — Ambulatory Visit (INDEPENDENT_AMBULATORY_CARE_PROVIDER_SITE_OTHER): Payer: Medicare HMO | Admitting: Psychology

## 2021-04-14 DIAGNOSIS — F4310 Post-traumatic stress disorder, unspecified: Secondary | ICD-10-CM

## 2021-04-14 NOTE — Progress Notes (Signed)
Date: 04/14/2021 ?Appointment Start Time: 8am ?Duration: 58 minutes ?Provider: Helmut Muster, PsyD ?Type of Session: Individual Therapy  ?Location of Patient: Home (private location) ?Location of Provider: Provider's Home (private office) ?Type of Contact: WebEx video visit with audio ? ?Session Content: Today's appointment was a telepsychological visit due to COVID-19. Courtney Trevino is aware it is her responsibility to secure confidentiality on her end of the session. She provided verbal consent to proceed with today's appointment. Prior to proceeding with today's appointment, Courtney Trevino's physical location at the time of this appointment was obtained as well a phone number she could be reached at in the event of technical difficulties.  ? ?This provider conducted a check-in. [name] endorsed ambivalence about a friendship, noting she is unsure how to proceed with it. This Clinical research associate assisted her in processing her emotional experiences, reflecting and reinforcing her efforts, identifying her values, and formulating a value congruent plan for how she would like to proceed with the friendship. She discussed how she is increasingly recognizing other's behaviors are not personal, utilizing an accepting attitude, acknowledging her strengths and warning signs in other's behaviors, comfort with herself and prioritization of self-cafe, and putting energy into relationships with supportive others. She stated plans to set boundaries with her friend, adding while it may cause further relational strain or the friendship to end, it is value congruent. She denied having any other issues or concerns.  ? ?Courtney Trevino was receptive to today's appointment as evidenced by openness to sharing and responsiveness to feedback. ? ?Mental Status Examination: ?Appearance:  neat ?Behavior: appropriate to circumstances ?Mood: neutral ?Affect: mood congruent ?Speech: WNL ?Eye Contact: appropriate ?Psychomotor Activity: WNL ?Thought Process: linear,  logical, and goal directed and denies suicidal, homicidal, and self-harm ideation, plan and intent ?Content/Perceptual Disturbances: none ?Orientation: AAOx4 ?Cognition/Sensorium: intact ?Insight: good ?Judgment: good ? ?Interventions:  ?Provided empathic reflections and validation ?Reviewed content from the previous session ?Provided positive reinforcement ?Employed supportive psychotherapy interventions to facilitate reduced distress and to improve coping skills with identified stressors ?Employed motivational interviewing skills to assess patient's willingness/desire to adhere to recommended medical treatments and assignments ?Engaged patient in goal setting ?Engaged patient in problem solving ?Explored patient's values  ?Reviewed learned skills ?Formulating value congruent plans ? ?DSM-5 Diagnosis(es):  ?F43.10 Posttraumatic Stress Disorder ? ?Plan: Courtney Trevino is currently scheduled for a follow-up appointment on 04/28/2021 at 8am via WebEx video visit with audio. The next session will focus on value congruent actions and navigating relational issues.  ? ?Treatment Goal & Progress:  ?1. Related Problem: Eliminate or reduce the negative impact trauma related symptoms have on  ?social, occupational, and family functioning. ?Description: Routinely use discussed therapeutic skills during stressors, relational issues,  ?and/or trauma reminders. ?Target Date: 2022-01-15 ?Frequency: Weekly ?Modality: individual ?Progress: 90%  ?2. Related Problem: Eliminate or reduce the negative impact trauma related symptoms have on  ?social, occupational, and family functioning. ?Description: Participate in Acceptance and Commitment Therapy (ACT) to reduce the impact  ?of the traumatic event. ?Target Date: 2022-01-15 ?Frequency: Weekly ?Modality: individual ?Progress: 90% ?? Planned Intervention: Use an ACT approach to PTSD to help the client experience and  ?accept the presence of troubling thoughts and images without being overly  impacted by  ?them, and committing his/her time and efforts to activities that are consistent with  ?identified, personally meaningful values. ?3. Related Problem: Eliminate or reduce the negative impact trauma related symptoms have on social, occupational, and family functioning. ?Description: Learn and implement personal skills to manage challenging situations related to trauma. ?  Target Date: 2022-01-15 ?Frequency: Weekly ?Modality: individual ?Progress: 100% ?4. Related Problem: Eliminate or reduce the negative impact trauma related symptoms have on  ?social, occupational, and family functioning. ?Description: Participate in Cognitive Processing Therapy to process the trauma and reduce its  ?impact. ?Target Date: 2022-01-15 ?Frequency: Weekly ?Modality: individual ?Progress: 100% ?5. Related Problem: Eliminate or reduce the negative impact trauma related symptoms have on  ?social, occupational, and family functioning. ?Description: Verbalize an understanding of the treatment rationale for PTSD. ?Target Date: 2022-01-15 ?Frequency: Weekly ?Modality: individual ?Progress: 100% ?6. Related Problem: Eliminate or reduce the negative impact trauma related symptoms have on  ?social, occupational, and family functioning. ?Description: Learn and implement calming skills. ?Target Date: 2022-01-15 ?Frequency: Weekly ?Modality: individual ?Progress: 90% ?? Planned Intervention: Teach the client calming skills (e.g., breathing retraining, relaxation,calming self-talk) to use in and between sessions when feeling overly distressed. ? ?Ms. Roemmich was agreeable with the treatment plan, and verbally consented to proceed with it.  ? ? ? ? ? ? ? ? ? ? ?[name], PsyD ?

## 2021-04-28 ENCOUNTER — Ambulatory Visit (INDEPENDENT_AMBULATORY_CARE_PROVIDER_SITE_OTHER): Payer: Medicare HMO | Admitting: Psychology

## 2021-04-28 DIAGNOSIS — F4310 Post-traumatic stress disorder, unspecified: Secondary | ICD-10-CM

## 2021-04-28 NOTE — Progress Notes (Signed)
Date: 04/28/2021 ?Appointment Start Time: 8am ?Duration: 55 minutes ?Provider: Helmut Muster, PsyD ?Type of Session: Individual Therapy  ?Location of Patient: Home (private location) ?Location of Provider: Provider's Home (private office) ?Type of Contact: WebEx video visit with audio ? ?Session Content: Today's appointment was a telepsychological visit due to COVID-19. Courtney Trevino is aware it is her responsibility to secure confidentiality on her end of the session. She provided verbal consent to proceed with today's appointment. Prior to proceeding with today's appointment, Naika's physical location at the time of this appointment was obtained as well a phone number she could be reached at in the event of technical difficulties.  ? ?This provider conducted a check-in. Ms. Novicki endorsed an ongoing good mood, discussing examples of defusing from relational issues when noticing she is becoming increasingly upset or effective communication is not occurring, having found alternative housing that appears to be an improvement, going on dates, setting boundaries and being honest with others, use of grounding and humor, and reminding herself of her intentions and values, and empathy for other's perceptions. This Clinical research associate reflected and reinforced her efforts, provided psychoeducation on acceptance and assertive communication, and reviewing previously discussed coping strategies. She denied having any other issues or concerns.  ? ?Caleyah was receptive to today's appointment as evidenced by openness to sharing and responsiveness to feedback. ? ?Mental Status Examination: ?Appearance:  neat ?Behavior: appropriate to circumstances ?Mood: euthymic ?Affect: mood congruent ?Speech: WNL ?Eye Contact: appropriate ?Psychomotor Activity: WNL ?Thought Process: linear, logical, and goal directed and denies suicidal, homicidal, and self-harm ideation, plan and intent ?Content/Perceptual Disturbances: none ?Orientation:  AAOx4 ?Cognition/Sensorium: intact ?Insight: good ?Judgment: good ? ?Interventions:  ?Provided empathic reflections and validation ?Reviewed content from the previous session ?Provided positive reinforcement ?Employed supportive psychotherapy interventions to facilitate reduced distress and to improve coping skills with identified stressors ?Engaged patient in goal setting ?Engaged patient in problem solving ?Explored patient's values  ?Reviewed learned skills ? ?DSM-5 Diagnosis(es):  ?F43.10 Posttraumtic Stress Disorder ? ?Plan: Dennise is currently scheduled for a follow-up appointment on 05/12/2021 at 8am via WebEx video visit with audio. The next session will focus on value congruent actions.  ? ? ?Treatment Goal & Progress:  ?1. Related Problem: Eliminate or reduce the negative impact trauma related symptoms have on  ?social, occupational, and family functioning. ?Description: Routinely use discussed therapeutic skills during stressors, relational issues,  ?and/or trauma reminders. ?Target Date: 2022-01-15 ?Frequency: Weekly ?Modality: individual ?Progress: 90%  ?2. Related Problem: Eliminate or reduce the negative impact trauma related symptoms have on  ?social, occupational, and family functioning. ?Description: Participate in Acceptance and Commitment Therapy (ACT) to reduce the impact  ?of the traumatic event. ?Target Date: 2022-01-15 ?Frequency: Weekly ?Modality: individual ?Progress: 90% ?? Planned Intervention: Use an ACT approach to PTSD to help the client experience and  ?accept the presence of troubling thoughts and images without being overly impacted by  ?them, and committing his/her time and efforts to activities that are consistent with  ?identified, personally meaningful values. ?3. Related Problem: Eliminate or reduce the negative impact trauma related symptoms have on social, occupational, and family functioning. ?Description: Learn and implement personal skills to manage challenging situations  related to trauma. ?Target Date: 2022-01-15 ?Frequency: Weekly ?Modality: individual ?Progress: 100% ?4. Related Problem: Eliminate or reduce the negative impact trauma related symptoms have on  ?social, occupational, and family functioning. ?Description: Participate in Cognitive Processing Therapy to process the trauma and reduce its  ?impact. ?Target Date: 2022-01-15 ?Frequency: Weekly ?Modality: individual ?Progress: 100% ?  5. Related Problem: Eliminate or reduce the negative impact trauma related symptoms have on  ?social, occupational, and family functioning. ?Description: Verbalize an understanding of the treatment rationale for PTSD. ?Target Date: 2022-01-15 ?Frequency: Weekly ?Modality: individual ?Progress: 100% ?6. Related Problem: Eliminate or reduce the negative impact trauma related symptoms have on  ?social, occupational, and family functioning. ?Description: Learn and implement calming skills. ?Target Date: 2022-01-15 ?Frequency: Weekly ?Modality: individual ?Progress: 90% ?? Planned Intervention: Teach the client calming skills (e.g., breathing retraining, relaxation,calming self-talk) to use in and between sessions when feeling overly distressed. ? ?Ms. Crabtree was agreeable with the treatment plan, and verbally consented to proceed with it.  ? ? ? ? ? ? ? ? ? ? ? ? ?[name], PsyD ?

## 2021-05-09 NOTE — Progress Notes (Signed)
Date: 05/12/2021 ?Appointment Start Time: 8:05 ?Duration: 55 minutes ?Provider: Clarice Pole, PsyD ?Type of Session: Individual Therapy  ?Location of Patient: Home (private location) ?Location of Provider: Provider's Home (private office) ?Type of Contact: WebEx video visit with audio ? ?Session Content: Today's appointment was a telepsychological visit due to COVID-19. Courtney Trevino is aware it is her responsibility to secure confidentiality on her end of the session. She provided verbal consent to proceed with today's appointment. Prior to proceeding with today's appointment, Courtney Trevino's physical location at the time of this appointment was obtained as well a phone number she could be reached at in the event of technical difficulties.  ? ?This provider conducted a check-in. Courtney Trevino endorsed anxiety, initially noting she was unsure of what was contributing to it. This Probation officer assisted her in processing her emotional experiences, identifying and reflecting warning signs and her efforts, and formulating plans. She discussed how menopause, not having Klonopin, and other's behaviors likely contributed to her exacerbated anxiety. She further discussed how she coped by "counting" before responding, walking away or hanging up the phone, and limiting her interactions with others that could cause emotional upset. She identified warning signs as including not letting others talk over her, taking others behaviors personally, low frustration tolerance, having a pressured voice, struggling against her emotional experiences, and feeling her "space" is being invaded. She concluded with plans to utilize self-compassion and mindfulness-based techniques, limit interactions with others that tend to be stressful, pause before responding, and walking away if needed. She denied having any other issues or concerns.  ? ?Courtney Trevino was receptive to today's appointment as evidenced by openness to sharing and responsiveness to feedback. ? ?Mental  Status Examination: ?Appearance:  neat ?Behavior: appropriate to circumstances ?Mood: irritable ?Affect: mood congruent ?Speech: pressured ?Eye Contact: appropriate ?Psychomotor Activity: agitated  ?Thought Process: linear, logical, and goal directed and denies suicidal, homicidal, and self-harm ideation, plan and intent ?Content/Perceptual Disturbances: none ?Orientation: AAOx4 ?Cognition/Sensorium: intact ?Insight: good ?Judgment: good ? ?Interventions:  ?Provided empathic reflections and validation ?Reviewed content from the previous session ?Provided positive reinforcement ?Employed supportive psychotherapy interventions to facilitate reduced distress and to improve coping skills with identified stressors ?Engaged patient in goal setting ?Engaged patient in problem solving ?Psychoeducation provided regarding cognitive distortions  ?Psychoeducation provided regarding self-care ?Explored patient's values  ?Reviewed learned skills ? ?DSM-5 Diagnosis(es):  ?F43.10 Posttraumtic Stress Disorder ? ?Plan: Courtney Trevino is currently scheduled for a follow-up appointment on 05/26/2021 at 8am via WebEx video visit with audio. The next session will focus on value congruent actions.  ? ?Treatment Goal & Progress:  ?1. Related Problem: Eliminate or reduce the negative impact trauma related symptoms have on  ?social, occupational, and family functioning. ?Description: Routinely use discussed therapeutic skills during stressors, relational issues,  ?and/or trauma reminders. ?Target Date: 2022-01-15 ?Frequency: Weekly ?Modality: individual ?Progress: 90%  ?2. Related Problem: Eliminate or reduce the negative impact trauma related symptoms have on  ?social, occupational, and family functioning. ?Description: Participate in Acceptance and Commitment Therapy (ACT) to reduce the impact  ?of the traumatic event. ?Target Date: 2022-01-15 ?Frequency: Weekly ?Modality: individual ?Progress: 90% ?? Planned Intervention: Use an ACT approach to  PTSD to help the client experience and  ?accept the presence of troubling thoughts and images without being overly impacted by  ?them, and committing his/her time and efforts to activities that are consistent with  ?identified, personally meaningful values. ?3. Related Problem: Eliminate or reduce the negative impact trauma related symptoms have on social, occupational, and family functioning. ?  Description: Learn and implement personal skills to manage challenging situations related to trauma. ?Target Date: 2022-01-15 ?Frequency: Weekly ?Modality: individual ?Progress: 100% ?4. Related Problem: Eliminate or reduce the negative impact trauma related symptoms have on  ?social, occupational, and family functioning. ?Description: Participate in Cognitive Processing Therapy to process the trauma and reduce its  ?impact. ?Target Date: 2022-01-15 ?Frequency: Weekly ?Modality: individual ?Progress: 100% ?5. Related Problem: Eliminate or reduce the negative impact trauma related symptoms have on  ?social, occupational, and family functioning. ?Description: Verbalize an understanding of the treatment rationale for PTSD. ?Target Date: 2022-01-15 ?Frequency: Weekly ?Modality: individual ?Progress: 100% ?6. Related Problem: Eliminate or reduce the negative impact trauma related symptoms have on  ?social, occupational, and family functioning. ?Description: Learn and implement calming skills. ?Target Date: 2022-01-15 ?Frequency: Weekly ?Modality: individual ?Progress: 90% ?? Planned Intervention: Teach the client calming skills (e.g., breathing retraining, relaxation,calming self-talk) to use in and between sessions when feeling overly distressed. ? ?Courtney Trevino was agreeable with the treatment plan, and verbally consented to proceed with it.  ? ? ? ? ? ? ? ?[name], PsyD ?

## 2021-05-12 ENCOUNTER — Ambulatory Visit (INDEPENDENT_AMBULATORY_CARE_PROVIDER_SITE_OTHER): Payer: Medicare HMO | Admitting: Psychology

## 2021-05-12 DIAGNOSIS — F4310 Post-traumatic stress disorder, unspecified: Secondary | ICD-10-CM

## 2021-05-26 ENCOUNTER — Ambulatory Visit (INDEPENDENT_AMBULATORY_CARE_PROVIDER_SITE_OTHER): Payer: Medicare HMO | Admitting: Psychology

## 2021-05-26 DIAGNOSIS — F4310 Post-traumatic stress disorder, unspecified: Secondary | ICD-10-CM

## 2021-05-26 NOTE — Progress Notes (Signed)
Date: 05/26/2021 ?Appointment Start Time: 8:05- ?Duration: 55 minutes ?Provider: Helmut Muster, PsyD ?Type of Session: Individual Therapy  ?Location of Patient: Home (private location) ?Location of Provider: Provider's Home (private office) ?Type of Contact: WebEx video visit with audio ? ?Session Content: Today's appointment was a telepsychological visit due to COVID-19. Courtney Trevino is aware it is her responsibility to secure confidentiality on her end of the session. She provided verbal consent to proceed with today's appointment. Prior to proceeding with today's appointment, Courtney Trevino's physical location at the time of this appointment was obtained as well a phone number she could be reached at in the event of technical difficulties.  ? ?This provider conducted a check-in. Ms. Peruski described relational conflict with various family members, a friend sending "mixed signals," and issues refilling a medication. This Clinical research associate assisted her inn processing her emotional experiences, reflected and reinforced her efforts, and formulating value congruent plans to address identified concerns. She described how last year her family member's behaviors would have caused psychological suffering and reduced use of self-care, noting how this time she utilized acceptance, reminding herself their behaviors are not personal, use of positive messages, and catching warning signs (e.g., feelings of regret). She further described plans to maintain boundaries with various others, continue exercising and engaging in self-care, and to reach out to supported others over the coming weeks. She denied having any other issues or concerns.  ? ?Courtney Trevino was receptive to today's appointment as evidenced by openness to sharing and responsiveness to feedback. ? ?Interventions:  ?Provided empathic reflections and validation ?Reviewed content from the previous session ?Provided positive reinforcement ?Employed supportive psychotherapy interventions to  facilitate reduced distress and to improve coping skills with identified stressors ?Engaged patient in goal setting ?Engaged patient in problem solving ?Psychoeducation provided regarding cognitive distortions  ?Explored patient's values  ?Reviewed learned skills ? ?DSM-5 Diagnosis(es):  ?F43.10 Posttraumtic Stress Disorder ? ?Plan: Courtney Trevino is currently scheduled for a follow-up appointment on 06/09/2021 at 8am via WebEx video visit with audio. The next session will focus on value congruent actions.  ? ?Treatment Goal & Progress:  ?1. Related Problem: Eliminate or reduce the negative impact trauma related symptoms have on  ?social, occupational, and family functioning. ?Description: Routinely use discussed therapeutic skills during stressors, relational issues,  ?and/or trauma reminders. ?Target Date: 2022-01-15 ?Frequency: Weekly ?Modality: individual ?Progress: 90%  ?2. Related Problem: Eliminate or reduce the negative impact trauma related symptoms have on  ?social, occupational, and family functioning. ?Description: Participate in Acceptance and Commitment Therapy (ACT) to reduce the impact  ?of the traumatic event. ?Target Date: 2022-01-15 ?Frequency: Weekly ?Modality: individual ?Progress: 90% ?? Planned Intervention: Use an ACT approach to PTSD to help the client experience and  ?accept the presence of troubling thoughts and images without being overly impacted by  ?them, and committing his/her time and efforts to activities that are consistent with  ?identified, personally meaningful values. ?3. Related Problem: Eliminate or reduce the negative impact trauma related symptoms have on social, occupational, and family functioning. ?Description: Learn and implement personal skills to manage challenging situations related to trauma. ?Target Date: 2022-01-15 ?Frequency: Weekly ?Modality: individual ?Progress: 100% ?4. Related Problem: Eliminate or reduce the negative impact trauma related symptoms have on   ?social, occupational, and family functioning. ?Description: Participate in Cognitive Processing Therapy to process the trauma and reduce its  ?impact. ?Target Date: 2022-01-15 ?Frequency: Weekly ?Modality: individual ?Progress: 100% ?5. Related Problem: Eliminate or reduce the negative impact trauma related symptoms have on  ?social, occupational, and  family functioning. ?Description: Verbalize an understanding of the treatment rationale for PTSD. ?Target Date: 2022-01-15 ?Frequency: Weekly ?Modality: individual ?Progress: 100% ?6. Related Problem: Eliminate or reduce the negative impact trauma related symptoms have on  ?social, occupational, and family functioning. ?Description: Learn and implement calming skills. ?Target Date: 2022-01-15 ?Frequency: Weekly ?Modality: individual ?Progress: 90% ?? Planned Intervention: Teach the client calming skills (e.g., breathing retraining, relaxation,calming self-talk) to use in and between sessions when feeling overly distressed. ? ? ? ? ? ? ? ? ? ? ? ? ?Courtney Gouge, PsyD ?

## 2021-06-09 ENCOUNTER — Ambulatory Visit (INDEPENDENT_AMBULATORY_CARE_PROVIDER_SITE_OTHER): Payer: Medicare HMO | Admitting: Psychology

## 2021-06-09 DIAGNOSIS — F4310 Post-traumatic stress disorder, unspecified: Secondary | ICD-10-CM | POA: Diagnosis not present

## 2021-06-09 NOTE — Progress Notes (Signed)
Date: 06/09/2021 ?Appointment Start Time: 8:06am (patient arrived late to the appointment) ?Duration: 54 minutes ?Provider: Helmut Muster, PsyD ?Type of Session: Individual Therapy  ?Location of Patient: Home (private location) ?Location of Provider: Provider's Home (private office) ?Type of Contact: WebEx video visit with audio ? ?Session Content: Today's appointment was a telepsychological visit due to COVID-19. Courtney Trevino is aware it is her responsibility to secure confidentiality on her end of the session. She provided verbal consent to proceed with today's appointment. Prior to proceeding with today's appointment, Courtney Trevino's physical location at the time of this appointment was obtained as well a phone number she could be reached at in the event of technical difficulties.  ? ?This provider conducted a check-in. [name] endorsed an okay mood, although discussed examples of an injury from falling twice, ongoing relational issues with her sister, her brother being released from prison with all charges dropped but losing his home, a family member fostering a child, and plans to pursue the possibility of medical marijuana. This Clinical research associate assisted her in processing her emotional experiences, reflecting and reinforcing her efforts, identifying her values, and formulating plans. She discussed how she has maintained boundaries with various others, used assertive communication, is cautiously optimistic about her brother's efforts for sobriety and is supporting him by listening to his concerns, and plans to continue speaking with her doctor about the possibility of medical marijuana. She further discussed how all her efforts have been value congruent despite trauma-related reminders occurring. She denied having any other issues or concerns.  ? ?Courtney Trevino was receptive to today's appointment as evidenced by openness to sharing and responsiveness to feedback. ? ?Interventions:  ?Provided empathic reflections and  validation ?Reviewed content from the previous session ?Provided positive reinforcement ?Employed supportive psychotherapy interventions to facilitate reduced distress and to improve coping skills with identified stressors ?Engaged patient in goal setting ?Engaged patient in problem solving ?Explored patient's values  ?Reviewed learned skills ? ?DSM-5 Diagnosis(es):  ?F43.10 Posttraumtic Stress Disorder ? ?Plan: Courtney Trevino is currently scheduled for a follow-up appointment on 06/23/2021 at 8am via WebEx video visit with audio. The next session will focus on value congruent actions.  ? ?Treatment Goal & Progress:  ?1. Related Problem: Eliminate or reduce the negative impact trauma related symptoms have on  ?social, occupational, and family functioning. ?Description: Routinely use discussed therapeutic skills during stressors, relational issues,  ?and/or trauma reminders. ?Target Date: 2022-01-15 ?Frequency: Weekly ?Modality: individual ?Progress: 90%  ?2. Related Problem: Eliminate or reduce the negative impact trauma related symptoms have on  ?social, occupational, and family functioning. ?Description: Participate in Acceptance and Commitment Therapy (ACT) to reduce the impact  ?of the traumatic event. ?Target Date: 2022-01-15 ?Frequency: Weekly ?Modality: individual ?Progress: 90% ?? Planned Intervention: Use an ACT approach to PTSD to help the client experience and  ?accept the presence of troubling thoughts and images without being overly impacted by  ?them, and committing his/her time and efforts to activities that are consistent with  ?identified, personally meaningful values. ?3. Related Problem: Eliminate or reduce the negative impact trauma related symptoms have on social, occupational, and family functioning. ?Description: Learn and implement personal skills to manage challenging situations related to trauma. ?Target Date: 2022-01-15 ?Frequency: Weekly ?Modality: individual ?Progress: 100% ?4. Related Problem:  Eliminate or reduce the negative impact trauma related symptoms have on  ?social, occupational, and family functioning. ?Description: Participate in Cognitive Processing Therapy to process the trauma and reduce its  ?impact. ?Target Date: 2022-01-15 ?Frequency: Weekly ?Modality: individual ?Progress: 100% ?5. Related  Problem: Eliminate or reduce the negative impact trauma related symptoms have on  ?social, occupational, and family functioning. ?Description: Verbalize an understanding of the treatment rationale for PTSD. ?Target Date: 2022-01-15 ?Frequency: Weekly ?Modality: individual ?Progress: 100% ?6. Related Problem: Eliminate or reduce the negative impact trauma related symptoms have on  ?social, occupational, and family functioning. ?Description: Learn and implement calming skills. ?Target Date: 2022-01-15 ?Frequency: Weekly ?Modality: individual ?Progress: 90% ?? Planned Intervention: Teach the client calming skills (e.g., breathing retraining, relaxation,calming self-talk) to use in and between sessions when feeling overly distressed. ? ? ? ? ? ? ? ? ? ? ? ? ?[name], PsyD ?

## 2021-06-23 ENCOUNTER — Ambulatory Visit: Payer: Medicare HMO | Admitting: Psychology

## 2021-07-07 ENCOUNTER — Ambulatory Visit (INDEPENDENT_AMBULATORY_CARE_PROVIDER_SITE_OTHER): Payer: Medicare HMO | Admitting: Psychology

## 2021-07-07 DIAGNOSIS — F4310 Post-traumatic stress disorder, unspecified: Secondary | ICD-10-CM

## 2021-07-07 NOTE — Progress Notes (Signed)
Date: 07/07/2021 Appointment Start Time: 8am Duration: 57 minutes Provider: Clarice Pole, PsyD Type of Session: Individual Therapy  Location of Patient: Home (private location) Location of Provider: Provider's Home (private office) Type of Contact: WebEx video visit with audio  Session Content: Today's appointment was a telepsychological visit due to COVID-19. Courtney Trevino is aware it is her responsibility to secure confidentiality on her end of the session. She provided verbal consent to proceed with today's appointment. Prior to proceeding with today's appointment, Courtney Trevino's physical location at the time of this appointment was obtained as well a phone number she could be reached at in the event of technical difficulties.   This provider conducted a check-in. Courtney Trevino discussed coping with various psychosocial stressors. This Probation officer assisted her in processing her emotional experiences, identifying and reflecting her efforts and values, providing psychoeducation on acceptance and assertive communication, and formulating plans for the coming weeks. Courtney Trevino reported how back issues and sciatica pain led to a hospitalization, her brother was released from prison with "all charges dropped" but no possessions or housing, and relational conflicts have caused stress, although noted how she reduced psychological suffering by setting and maintaining boundaries, using assertive communication and honesty when needed, prioritizing her wellbeing, recognizing and accommodating physical limits, an accepting attitude and allowing herself to feel her emotional experiences, and planning around uncertainties. She stated goals of finding alternative housing and prioritizing rest over the coming weeks. She denied having any other issues or concerns.   Courtney Trevino denied experiencing suicidal ideation, plan, or intent since last meeting with this Probation officer and expressed confidence in his/her ability to keep himself/herself  safe.   Courtney Trevino was receptive to today's appointment as evidenced by openness to sharing and responsiveness to feedback.  Interventions:  Provided empathic reflections and validation Reviewed content from the previous session Provided positive reinforcement Employed supportive psychotherapy interventions to facilitate reduced distress and to improve coping skills with identified stressors Engaged patient in goal setting Engaged patient in problem solving Psychoeducation provided regarding cognitive distortions  Explored patient's values  Reviewed learned skills  DSM-5 Diagnosis(es):  F43.10 Posttraumtic Stress Disorder  Plan: Courtney Trevino is currently scheduled for a follow-up appointment on 07/21/2021 at 8am via WebEx video visit with audio. The next session will focus on value congruent actions.   reatment Goal & Progress:  1. Related Problem: Eliminate or reduce the negative impact trauma related symptoms have on  social, occupational, and family functioning. Description: Routinely use discussed therapeutic skills during stressors, relational issues,  and/or trauma reminders. Target Date: 2022-01-15 Frequency: Weekly Modality: individual Progress: 90%  2. Related Problem: Eliminate or reduce the negative impact trauma related symptoms have on  social, occupational, and family functioning. Description: Participate in Acceptance and Commitment Therapy (ACT) to reduce the impact  of the traumatic event. Target Date: 2022-01-15 Frequency: Weekly Modality: individual Progress: 90% ? Planned Intervention: Use an ACT approach to PTSD to help the client experience and  accept the presence of troubling thoughts and images without being overly impacted by  them, and committing his/her time and efforts to activities that are consistent with  identified, personally meaningful values. 3. Related Problem: Eliminate or reduce the negative impact trauma related symptoms have on social,  occupational, and family functioning. Description: Learn and implement personal skills to manage challenging situations related to trauma. Target Date: 2022-01-15 Frequency: Weekly Modality: individual Progress: 100% 4. Related Problem: Eliminate or reduce the negative impact trauma related symptoms have on  social, occupational, and family functioning. Description:  Participate in Cognitive Processing Therapy to process the trauma and reduce its  impact. Target Date: 2022-01-15 Frequency: Weekly Modality: individual Progress: 100% 5. Related Problem: Eliminate or reduce the negative impact trauma related symptoms have on  social, occupational, and family functioning. Description: Verbalize an understanding of the treatment rationale for PTSD. Target Date: 2022-01-15 Frequency: Weekly Modality: individual Progress: 100% 6. Related Problem: Eliminate or reduce the negative impact trauma related symptoms have on  social, occupational, and family functioning. Description: Learn and implement calming skills. Target Date: 2022-01-15 Frequency: Weekly Modality: individual Progress: 90% ? Planned Intervention: Teach the client calming skills (e.g., breathing retraining, relaxation,calming self-talk) to use in and between sessions when feeling overly distressed.   [name], PsyD

## 2021-07-21 ENCOUNTER — Ambulatory Visit: Payer: Medicare HMO | Admitting: Psychology

## 2021-08-04 ENCOUNTER — Ambulatory Visit: Payer: Medicare Other | Admitting: Psychology

## 2021-08-06 ENCOUNTER — Ambulatory Visit (INDEPENDENT_AMBULATORY_CARE_PROVIDER_SITE_OTHER): Payer: Medicare Other | Admitting: Psychology

## 2021-08-06 DIAGNOSIS — F4310 Post-traumatic stress disorder, unspecified: Secondary | ICD-10-CM

## 2021-08-18 ENCOUNTER — Ambulatory Visit (INDEPENDENT_AMBULATORY_CARE_PROVIDER_SITE_OTHER): Payer: Medicare Other | Admitting: Psychology

## 2021-08-18 DIAGNOSIS — F4310 Post-traumatic stress disorder, unspecified: Secondary | ICD-10-CM

## 2021-08-18 NOTE — Progress Notes (Signed)
Date: 08/18/2021 Appointment Start Time: 8am Duration: 55 minutes Provider: Helmut Muster, PsyD Type of Session: Individual Therapy  Location of Patient: Home (private location) Location of Provider: Provider's Home (private office) Type of Contact: WebEx video visit with audio  Session Content: Today's appointment was a telepsychological visit due to COVID-19. Courtney Trevino is aware it is her responsibility to secure confidentiality on her end of the session. She provided verbal consent to proceed with today's appointment. Prior to proceeding with today's appointment, Courtney Trevino physical location at the time of this appointment was obtained as well a phone number she could be reached at in the event of technical difficulties.   This provider conducted a check-in. Courtney Trevino endorsed an ongoing improved mood and engagement in various value congruent actions. This Clinical research associate assisted the patient in processing their emotional experiences, reflecting and reinforcing their efforts, and formulating value congruent goals. Courtney Trevino discussed how she has been pausing before responding, considering her values and practicing empathy for others in decision making and addressing relational strains, assisting others and plans to join a women's support groups, keeping her boundaries with others, and plans to visit her daughter this weekend. She further discussed how her actions have been increasingly value congruent and she has multiple options for her future that are exciting. She expressed appreciation for this writer's services.   Courtney Trevino did not endorse experiencing suicidal or homicidal ideation, plan, or intent since last meeting with this Clinical research associate and described future plans that include ongoing efforts to improve their wellbeing.   Courtney Trevino was receptive to today's appointment as evidenced by openness to sharing and responsiveness to feedback.  Interventions:  Reviewed content from the previous session Provided  positive reinforcement Employed supportive psychotherapy interventions to facilitate reduced distress and to improve coping skills with identified stressors Engaged patient in goal setting Engaged patient in problem solving Psychoeducation provided regarding values Explored patient's values  Reviewed learned skills  DSM-5 Diagnosis(es):  F43.10 Posttraumtic Stress Disorder  Plan: Courtney Trevino is currently scheduled for a follow-up appointment on 09/01/2021 at 8am via WebEx video visit with audio. The next session will focus on value congruent actions.    Treatment Goal & Progress:  1. Related Problem: Eliminate or reduce the negative impact trauma related symptoms have on  social, occupational, and family functioning. Description: Routinely use discussed therapeutic skills during stressors, relational issues,  and/or trauma reminders. Target Date: 2022-01-15 Frequency: Weekly Modality: individual Progress: 90%  2. Related Problem: Eliminate or reduce the negative impact trauma related symptoms have on  social, occupational, and family functioning. Description: Participate in Acceptance and Commitment Therapy (ACT) to reduce the impact  of the traumatic event. Target Date: 2022-01-15 Frequency: Weekly Modality: individual Progress: 90% ? Planned Intervention: Use an ACT approach to PTSD to help the client experience and  accept the presence of troubling thoughts and images without being overly impacted by  them, and committing his/her time and efforts to activities that are consistent with  identified, personally meaningful values. 3. Related Problem: Eliminate or reduce the negative impact trauma related symptoms have on social, occupational, and family functioning. Description: Learn and implement personal skills to manage challenging situations related to trauma. Target Date: 2022-01-15 Frequency: Weekly Modality: individual Progress: 100% 4. Related Problem: Eliminate or reduce  the negative impact trauma related symptoms have on  social, occupational, and family functioning. Description: Participate in Cognitive Processing Therapy to process the trauma and reduce its  impact. Target Date: 2022-01-15 Frequency: Weekly Modality: individual Progress: 100% 5. Related  Problem: Eliminate or reduce the negative impact trauma related symptoms have on  social, occupational, and family functioning. Description: Verbalize an understanding of the treatment rationale for PTSD. Target Date: 2022-01-15 Frequency: Weekly Modality: individual Progress: 100% 6. Related Problem: Eliminate or reduce the negative impact trauma related symptoms have on  social, occupational, and family functioning. Description: Learn and implement calming skills. Target Date: 2022-01-15 Frequency: Weekly Modality: individual Progress: 90% ? Planned Intervention: Teach the client calming skills (e.g., breathing retraining, relaxation,calming self-talk) to use in and between sessions when feeling overly distressed.             [name], PsyD

## 2021-08-30 NOTE — Progress Notes (Unsigned)
                [name], PsyD

## 2021-09-01 ENCOUNTER — Ambulatory Visit (INDEPENDENT_AMBULATORY_CARE_PROVIDER_SITE_OTHER): Payer: Medicare Other | Admitting: Psychology

## 2021-09-01 DIAGNOSIS — F4310 Post-traumatic stress disorder, unspecified: Secondary | ICD-10-CM | POA: Diagnosis not present

## 2021-09-15 ENCOUNTER — Ambulatory Visit: Payer: Medicare HMO | Admitting: Psychology

## 2021-09-22 ENCOUNTER — Ambulatory Visit (INDEPENDENT_AMBULATORY_CARE_PROVIDER_SITE_OTHER): Payer: Medicare Other | Admitting: Psychology

## 2021-09-22 DIAGNOSIS — F4310 Post-traumatic stress disorder, unspecified: Secondary | ICD-10-CM | POA: Diagnosis not present

## 2021-09-22 NOTE — Progress Notes (Signed)
Date: 09/22/2021 Appointment Start Time: 9:02am Duration: 35 minutes Provider: Helmut Muster, PsyD Type of Session: Individual Therapy  Location of Patient: Home (private location) Location of Provider: Provider's Home (private office) Type of Contact: WebEx video visit with audio  Session Content: Today's appointment was a telepsychological visit due to COVID-19. Jaquelyne is aware it is her responsibility to secure confidentiality on her end of the session. She provided verbal consent to proceed with today's appointment. Prior to proceeding with today's appointment, Atianna's physical location at the time of this appointment was obtained as well a phone number she could be reached at in the event of technical difficulties.   This provider conducted a check-in. Ms. Zavaleta endorsed a low mood secondary to side effects from a recent medical procedure and reminders of her limited support from family. This Clinical research associate assisted her in processing their emotional experiences, reflecting and reinforcing her efforts, providing psychoeducation on acceptance, problem solving, and formulating value congruent goals.  Ms. Benner discussed how her daughter not assisting her in getting to a medical appointment and a TV show in which the characters supported each other despite disagreements led to reminders of past trauma and her family member's hurtful behaviors, although noted "it is what is is" and that despite feeling "disheartened" she recognizes their behaviors are not personal, their boundaries deserve respect like hers do as well, reminding herself of supportive others around her and her efforts in maintaining those relationships, and prioritizing self-care. She added how her niece openly acknowledging some of the hurtful family member's hurtful behaviors was "validating." She denied having any other issues or concerns, and expressed a preference for an abbreviated appointment secondary to ongoing physical issues from a  medical procedure.  Ms. Javed did not endorse experiencing suicidal or homicidal ideation, plan, or intent since last meeting with this Clinical research associate and described future plans that include ongoing efforts to improve their wellbeing.   Julitza was receptive to today's appointment as evidenced by openness to sharing and responsiveness to feedback.  Interventions:  Provided empathic reflections and validation Reviewed content from the previous session Provided positive reinforcement Employed supportive psychotherapy interventions to facilitate reduced distress and to improve coping skills with identified stressors Engaged patient in goal setting Engaged patient in problem solving Psychoeducation provided regarding self-care Explored patient's values  Reviewed learned skills  DSM-5 Diagnosis(es):  F43.10 Posttraumtic Stress Disorder  Plan: Dennise is currently scheduled for a follow-up appointment on 09/29/2021 at 8am via WebEx video visit with audio. The next session will focus on value congruent actions.   Treatment Goal & Progress:  1. Related Problem: Eliminate or reduce the negative impact trauma related symptoms have on  social, occupational, and family functioning. Description: Routinely use discussed therapeutic skills during stressors, relational issues,  and/or trauma reminders. Target Date: 2022-01-15 Frequency: Weekly Modality: individual Progress: 90%  2. Related Problem: Eliminate or reduce the negative impact trauma related symptoms have on  social, occupational, and family functioning. Description: Participate in Acceptance and Commitment Therapy (ACT) to reduce the impact  of the traumatic event. Target Date: 2022-01-15 Frequency: Weekly Modality: individual Progress: 90% ? Planned Intervention: Use an ACT approach to PTSD to help the client experience and  accept the presence of troubling thoughts and images without being overly impacted by  them, and committing  his/her time and efforts to activities that are consistent with  identified, personally meaningful values. 3. Related Problem: Eliminate or reduce the negative impact trauma related symptoms have on social, occupational, and family functioning.  Description: Learn and implement personal skills to manage challenging situations related to trauma. Target Date: 2022-01-15 Frequency: Weekly Modality: individual Progress: 100% 4. Related Problem: Eliminate or reduce the negative impact trauma related symptoms have on  social, occupational, and family functioning. Description: Participate in Cognitive Processing Therapy to process the trauma and reduce its  impact. Target Date: 2022-01-15 Frequency: Weekly Modality: individual Progress: 100% 5. Related Problem: Eliminate or reduce the negative impact trauma related symptoms have on  social, occupational, and family functioning. Description: Verbalize an understanding of the treatment rationale for PTSD. Target Date: 2022-01-15 Frequency: Weekly Modality: individual Progress: 100% 6. Related Problem: Eliminate or reduce the negative impact trauma related symptoms have on  social, occupational, and family functioning. Description: Learn and implement calming skills. Target Date: 2022-01-15 Frequency: Weekly Modality: individual Progress: 90% ? Planned Intervention: Teach the client calming skills (e.g., breathing retraining, relaxation,calming self-talk) to use in and between sessions when feeling overly distressed.             [name], PsyD

## 2021-09-29 ENCOUNTER — Ambulatory Visit: Payer: Medicare Other | Admitting: Psychology

## 2021-09-29 DIAGNOSIS — F4310 Post-traumatic stress disorder, unspecified: Secondary | ICD-10-CM

## 2021-09-29 NOTE — Progress Notes (Signed)
This Clinical research associate contacted the patient via telephone to check-in as she had not presented for the appointment. She noted she was currently admitted to the hospital due to a significantly elevated blood pressure and was unsure when her discharge would be. She stated a plan to contact this writer's office as soon as she knew more about her medical discharge to reschedule. She denied having any other issues or concerns.                [name], PsyD

## 2021-10-13 ENCOUNTER — Ambulatory Visit (INDEPENDENT_AMBULATORY_CARE_PROVIDER_SITE_OTHER): Payer: Medicare Other | Admitting: Psychology

## 2021-10-13 DIAGNOSIS — F4310 Post-traumatic stress disorder, unspecified: Secondary | ICD-10-CM

## 2021-10-13 NOTE — Progress Notes (Signed)
Date: 10/13/2021 Appointment Start Time: 8:03am Duration: 59 minutes Provider: Helmut Muster, PsyD Type of Session: Individual Therapy  Location of Patient: Home (private location) Location of Provider: Provider's Home (private office) Type of Contact: WebEx video visit with audio  Session Content: Today's appointment was a telepsychological visit due to COVID-19. Courtney Trevino is aware it is her responsibility to secure confidentiality on her end of the session. She provided verbal consent to proceed with today's appointment. Prior to proceeding with today's appointment, Lindell's physical location at the time of this appointment was obtained as well a phone number she could be reached at in the event of technical difficulties.   This provider conducted a check-in. Ms. Yetman endorsed disappointment and hurt as a result of limited support from her children and concerns about some of the medical care she received. This Clinical research associate assisted her in processing their emotional experiences, reflecting and reinforcing her efforts, identifying warning signs for her emotional wellbeing, problem solving, and formulating plans. Ms. 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 She described how her children's limited supported and various medical staff's responses to her led to her feeling misunderstood, disappointed, hurt, and/or angry. She noted how she has been attempting to find a balance between attending all follow-up medical appointments, resolving snags to her medical care and receiving assistance, and relaxing as advised by medical staff, sharing how this has been difficult and contributed to ambivalence on how to best achieve a balance. Through conversation, she concluded with plans to continue utilizing mindfulness- and grounding-based techniques  (e.g., lighting a candle and listening to relaxing sounds) when emotionally upset, reaching out to [facility] for additional assistance at home and with transportation, limiting contact with others that carry significant risk of engaging in upsetting behavior, keeping in contact with supportive others, and keeping her emergency alert and blood pressure devices near her. She expressed a desire for a follow-up next week given the ongoing medical issues and resulting stress. She denied having any other issues or concerns.   Ms. Dantuono did not endorse experiencing suicidal or homicidal ideation, plan, or intent since last meeting with this Clinical research associate and described future plans that include ongoing efforts to improve their wellbeing.   Debi was receptive to today's appointment as evidenced by openness to sharing and responsiveness to feedback.  Interventions:  Provided empathic reflections and validation Reviewed content from the previous session Provided positive reinforcement Employed supportive psychotherapy interventions to facilitate reduced distress and to improve coping skills with identified stressors Engaged patient in goal setting Engaged patient in problem solving Psychoeducation provided regarding cognitive distortions  Reviewed learned skills Decisional balance  DSM-5 Diagnosis(es):  F43.10 Posttraumtic Stress Disorder  Plan: Parthenia is currently scheduled for a follow-up appointment on 10/20/2021 at 8am via WebEx video visit with audio. The next session will focus on value congruent actions.   Treatment Goal & Progress:  1. Related Problem: Eliminate or reduce the negative impact trauma related symptoms have on  social, occupational, and family functioning. Description: Routinely use discussed therapeutic skills during stressors, relational issues,  and/or trauma reminders. Target Date: 2022-01-15 Frequency: Weekly Modality: individual Progress: 90%  2. Related Problem: Eliminate or  reduce the negative impact trauma related symptoms have on  social, occupational, and family functioning. Description: Participate in Acceptance and Commitment Therapy (ACT) to reduce the impact  of the traumatic event. Target Date:  2022-01-15 Frequency: Weekly Modality: individual Progress: 90% ? Planned Intervention: Use an ACT approach to PTSD to help the client experience and  accept the presence of troubling thoughts and images without being overly impacted by  them, and committing his/her time and efforts to activities that are consistent with  identified, personally meaningful values. 3. Related Problem: Eliminate or reduce the negative impact trauma related symptoms have on social, occupational, and family functioning. Description: Learn and implement personal skills to manage challenging situations related to trauma. Target Date: 2022-01-15 Frequency: Weekly Modality: individual Progress: 100% 4. Related Problem: Eliminate or reduce the negative impact trauma related symptoms have on  social, occupational, and family functioning. Description: Participate in Cognitive Processing Therapy to process the trauma and reduce its  impact. Target Date: 2022-01-15 Frequency: Weekly Modality: individual Progress: 100% 5. Related Problem: Eliminate or reduce the negative impact trauma related symptoms have on  social, occupational, and family functioning. Description: Verbalize an understanding of the treatment rationale for PTSD. Target Date: 2022-01-15 Frequency: Weekly Modality: individual Progress: 100% 6. Related Problem: Eliminate or reduce the negative impact trauma related symptoms have on  social, occupational, and family functioning. Description: Learn and implement calming skills. Target Date: 2022-01-15 Frequency: Weekly Modality: individual Progress: 90% ? Planned Intervention: Teach the client calming skills (e.g., breathing retraining, relaxation,calming self-talk)  to use in and between sessions when feeling overly distressed.             [name], PsyD

## 2021-10-20 ENCOUNTER — Ambulatory Visit (INDEPENDENT_AMBULATORY_CARE_PROVIDER_SITE_OTHER): Payer: Medicare Other | Admitting: Psychology

## 2021-10-20 DIAGNOSIS — F4310 Post-traumatic stress disorder, unspecified: Secondary | ICD-10-CM

## 2021-10-20 NOTE — Progress Notes (Signed)
Date: 10/20/2021 Appointment Start Time: 8am Duration: 50 minutes Provider: Helmut Muster, PsyD Type of Session: Individual Therapy  Location of Patient: Home (private location) Location of Provider: Provider's Home (private office) Type of Contact: WebEx video visit with audio  Session Content: Today's appointment was a telepsychological visit due to COVID-19. Courtney Trevino is aware it is her responsibility to secure confidentiality on her end of the session. She provided verbal consent to proceed with today's appointment. Prior to proceeding with today's appointment, Courtney Trevino's physical location at the time of this appointment was obtained as well a phone number she could be reached at in the event of technical difficulties.   This provider conducted a check-in.Courtney Trevino endorsed an overall improved mood, discussing efforts to address her emotional and physical wellbeing. This Clinical research associate assisted her in processing their emotional experiences, reflecting and reinforcing her efforts, identifying warning signs for her emotional and physical wellbeing and entering into maladaptive roles, problem solving, and formulating value congruent goals. Courtney Trevino described how despite ongoing relational stressors with family and medical issues, she has been monitoring for warning signs of limits (e.g., dizziness, significant fatigue, numbness or tingling), resting as needed, engaging in pleasurable activities, reminding herself other's behaviors are not personal, having alternative plan options should she need to rest, and processing her experiences. She concluded with plans to continue her efforts, spend time with a friend, and to go to a social event this weekend. She denied having any other issues or concerns.    Courtney Trevino did not endorse experiencing suicidal or homicidal ideation, plan, or intent since last meeting with this Clinical research associate and described future plans that include ongoing efforts to improve their wellbeing.    Courtney Trevino was receptive to today's appointment as evidenced by openness to sharing and responsiveness to feedback.  Interventions:  Provided empathic reflections and validation Reviewed content from the previous session Provided positive reinforcement Employed supportive psychotherapy interventions to facilitate reduced distress and to improve coping skills with identified stressors Engaged patient in goal setting Engaged patient in problem solving Psychoeducation provided regarding cognitive distortions  Psychoeducation provided regarding self-care Explored patient's values  Reviewed learned skills  DSM-5 Diagnosis(es):  F43.10 Posttraumtic Stress Disorder  Plan: Courtney Trevino is currently scheduled for a follow-up appointment on 10/27/2021 at 8am via WebEx video visit with audio. The next session will focus on value congruent actions.   Treatment Goal & Progress:  1. Related Problem: Eliminate or reduce the negative impact trauma related symptoms have on  social, occupational, and family functioning. Description: Routinely use discussed therapeutic skills during stressors, relational issues,  and/or trauma reminders. Target Date: 2022-01-15 Frequency: Weekly Modality: individual Progress: 90%  2. Related Problem: Eliminate or reduce the negative impact trauma related symptoms have on  social, occupational, and family functioning. Description: Participate in Acceptance and Commitment Therapy (ACT) to reduce the impact  of the traumatic event. Target Date: 2022-01-15 Frequency: Weekly Modality: individual Progress: 90% ? Planned Intervention: Use an ACT approach to PTSD to help the client experience and  accept the presence of troubling thoughts and images without being overly impacted by  them, and committing his/her time and efforts to activities that are consistent with  identified, personally meaningful values. 3. Related Problem: Eliminate or reduce the negative impact  trauma related symptoms have on social, occupational, and family functioning. Description: Learn and implement personal skills to manage challenging situations related to trauma. Target Date: 2022-01-15 Frequency: Weekly Modality: individual Progress: 100% 4. Related Problem: Eliminate or reduce the negative impact trauma  related symptoms have on  social, occupational, and family functioning. Description: Participate in Cognitive Processing Therapy to process the trauma and reduce its  impact. Target Date: 2022-01-15 Frequency: Weekly Modality: individual Progress: 100% 5. Related Problem: Eliminate or reduce the negative impact trauma related symptoms have on  social, occupational, and family functioning. Description: Verbalize an understanding of the treatment rationale for PTSD. Target Date: 2022-01-15 Frequency: Weekly Modality: individual Progress: 100% 6. Related Problem: Eliminate or reduce the negative impact trauma related symptoms have on  social, occupational, and family functioning. Description: Learn and implement calming skills. Target Date: 2022-01-15 Frequency: Weekly Modality: individual Progress: 90% ? Planned Intervention: Teach the client calming skills (e.g., breathing retraining, relaxation,calming self-talk) to use in and between sessions when feeling overly distressed.              [name], PsyD

## 2021-10-27 ENCOUNTER — Ambulatory Visit (INDEPENDENT_AMBULATORY_CARE_PROVIDER_SITE_OTHER): Payer: Medicare Other | Admitting: Psychology

## 2021-10-27 DIAGNOSIS — F4310 Post-traumatic stress disorder, unspecified: Secondary | ICD-10-CM

## 2021-10-27 NOTE — Progress Notes (Signed)
Date: 10/27/2021 Appointment Start Time: 8:02am Duration: 58 minutes Provider: Helmut Muster, PsyD Type of Session: Individual Therapy  Location of Patient: Home (private location) Location of Provider: Provider's Home (private office) Type of Contact: WebEx video visit with audio  Session Content: Today's appointment was a telepsychological visit due to COVID-19. Courtney Trevino is aware it is her responsibility to secure confidentiality on her end of the session. She provided verbal consent to proceed with today's appointment. Prior to proceeding with today's appointment, Courtney Trevino's physical location at the time of this appointment was obtained as well a phone number she could be reached at in the event of technical difficulties.   This provider conducted a check-in. Courtney Trevino endorsed anxiety about additional information she received about her medical status. This Clinical research associate assisted her in processing her emotional experiences, reflecting and reinforcing her efforts, identifying warning signs for her emotional wellbeing, problem solving, psychoeducation on sleep hygiene, and formulating plans. Courtney Trevino discussed how she has been cleaning and organizing her apartment in an effort to "distract" from ongoing stressors as well as to prepare for potential future scenarios that involve moving and/or further loss of independence from her medical concerns. She further discussed how this and difficulty asking for assistance has led to her pushing past limits, although added how she has plans to have others assist her and is increasingly aware of the importance of doing so as well as has been fostering an accepting attitude of her medical status. She also described sleep issues as a result of childhood trauma, and how it has led to reliance on using the TV with sound on to feel safe while sleeping. She concluded with plans to have others over to assist her with efforts to prepare her living situation for future potential  scenarios, packing her hospital bag, to utilize a timer as an external reminder to take breaks, and to use YouTube videos on her TV with light ambient music (versus channels that have fluctuating volume and people talking) to assist with sleep.  Courtney Trevino did not endorse experiencing suicidal or homicidal ideation, plan, or intent since last meeting with this Clinical research associate and described future plans that include ongoing efforts to improve their wellbeing.   Courtney Trevino was receptive to today's appointment as evidenced by openness to sharing and responsiveness to feedback.  Interventions:  Provided empathic reflections and validation Reviewed content from the previous session Provided positive reinforcement Employed supportive psychotherapy interventions to facilitate reduced distress and to improve coping skills with identified stressors Engaged patient in goal setting Engaged patient in problem solving Explored patient's values  Reviewed learned skills  DSM-5 Diagnosis(es):  F43.10 Posttraumtic Stress Disorder  Plan: Courtney Trevino is currently scheduled for a follow-up appointment on 11/04/2021 at 2pm via WebEx video visit with audio. The next session will focus on value congruent actions.   Treatment Goal & Progress:  1. Related Problem: Eliminate or reduce the negative impact trauma related symptoms have on  social, occupational, and family functioning. Description: Routinely use discussed therapeutic skills during stressors, relational issues,  and/or trauma reminders. Target Date: 2022-01-15 Frequency: Weekly Modality: individual Progress: 90%  2. Related Problem: Eliminate or reduce the negative impact trauma related symptoms have on  social, occupational, and family functioning. Description: Participate in Acceptance and Commitment Therapy (ACT) to reduce the impact  of the traumatic event. Target Date: 2022-01-15 Frequency: Weekly Modality: individual Progress: 90% ? Planned  Intervention: Use an ACT approach to PTSD to help the client experience and  accept the presence of  troubling thoughts and images without being overly impacted by  them, and committing his/her time and efforts to activities that are consistent with  identified, personally meaningful values. 3. Related Problem: Eliminate or reduce the negative impact trauma related symptoms have on social, occupational, and family functioning. Description: Learn and implement personal skills to manage challenging situations related to trauma. Target Date: 2022-01-15 Frequency: Weekly Modality: individual Progress: 100% 4. Related Problem: Eliminate or reduce the negative impact trauma related symptoms have on  social, occupational, and family functioning. Description: Participate in Cognitive Processing Therapy to process the trauma and reduce its  impact. Target Date: 2022-01-15 Frequency: Weekly Modality: individual Progress: 100% 5. Related Problem: Eliminate or reduce the negative impact trauma related symptoms have on  social, occupational, and family functioning. Description: Verbalize an understanding of the treatment rationale for PTSD. Target Date: 2022-01-15 Frequency: Weekly Modality: individual Progress: 100% 6. Related Problem: Eliminate or reduce the negative impact trauma related symptoms have on  social, occupational, and family functioning. Description: Learn and implement calming skills. Target Date: 2022-01-15 Frequency: Weekly Modality: individual Progress: 90% ? Planned Intervention: Teach the client calming skills (e.g., breathing retraining, relaxation,calming self-talk) to use in and between sessions when feeling overly distressed.            [name], PsyD

## 2021-11-04 ENCOUNTER — Ambulatory Visit (INDEPENDENT_AMBULATORY_CARE_PROVIDER_SITE_OTHER): Payer: Medicare Other | Admitting: Psychology

## 2021-11-04 DIAGNOSIS — F4310 Post-traumatic stress disorder, unspecified: Secondary | ICD-10-CM

## 2021-11-04 NOTE — Progress Notes (Signed)
Date: 11/04/2021 Appointment Start Time: 2pm Duration: 58 minutes Provider: Helmut Muster, PsyD Type of Session: Individual Therapy  Location of Patient: Home (private location) Location of Provider: Provider's Home (private office) Type of Contact: WebEx video visit with audio  Session Content: Today's appointment was a telepsychological visit due to COVID-19. Janayla is aware it is her responsibility to secure confidentiality on her end of the session. She provided verbal consent to proceed with today's appointment. Prior to proceeding with today's appointment, Lakea's physical location at the time of this appointment was obtained as well a phone number she could be reached at in the event of technical difficulties.   This provider conducted a check-in. Ms. Pliler endorsed stress secondary to ongoing relational conflicts and find  This writer assisted her in processing her emotional experiences, reflecting and reinforcing her efforts, identifying warning signs for their emotional wellbeing and entering into maladaptive roles, problem solving, and formulating plans for ongoing stressors. Ms. Cheadle described how medical staff's abnormal responses to her (e.g., kind touching, expressing of significant sympathy, and a sense of urgency) has led to concerns she is not being told the full picture or severity of her current medical status, relational strains with various others, and finding a sewer slug in her ear has led to stress and increased sleep issues. She concluded with plans to express her concerns to medical staff and to share how it she would rather know a painful truth so she can adequately plan and adjust, to have someone sweep her apartment and to spray Raid outside of it as well as to use "cotton balls" that are lightly put in her ear to prevent any potential further issues with "sewer slugs," to continue to maintain her contain her boundaries with her daughter, remind herself there is  nothing more that needs to be done regarding a situation with her friend, and to continue searching for an alternate agency for a new aide.  She denied having any other issues or concerns.  Ms. Karau did not endorse experiencing suicidal or homicidal ideation, plan, or intent since last meeting with this Clinical research associate and described future plans that include ongoing efforts to improve their wellbeing.   Alyne was receptive to today's appointment as evidenced by openness to sharing and responsiveness to feedback.  Interventions:  Provided empathic reflections and validation Reviewed content from the previous session Provided positive reinforcement Employed supportive psychotherapy interventions to facilitate reduced distress and to improve coping skills with identified stressors Engaged patient in goal setting Engaged patient in problem solving Psychoeducation provided regarding self-care Explored patient's values  Reviewed learned skills  DSM-5 Diagnosis(es):  F43.10 Posttraumtic Stress Disorder  Plan: Trenay is currently scheduled for a follow-up appointment on 11/10/2021 at 8am via WebEx video visit with audio. The next session will focus on value congruent actions.   Treatment Goal & Progress:  1. Related Problem: Eliminate or reduce the negative impact trauma related symptoms have on  social, occupational, and family functioning. Description: Routinely use discussed therapeutic skills during stressors, relational issues,  and/or trauma reminders. Target Date: 2022-01-15 Frequency: Weekly Modality: individual Progress: 90%  2. Related Problem: Eliminate or reduce the negative impact trauma related symptoms have on  social, occupational, and family functioning. Description: Participate in Acceptance and Commitment Therapy (ACT) to reduce the impact  of the traumatic event. Target Date: 2022-01-15 Frequency: Weekly Modality: individual Progress: 90% ? Planned Intervention: Use an  ACT approach to PTSD to help the client experience and  accept the presence of  troubling thoughts and images without being overly impacted by  them, and committing his/her time and efforts to activities that are consistent with  identified, personally meaningful values. 3. Related Problem: Eliminate or reduce the negative impact trauma related symptoms have on social, occupational, and family functioning. Description: Learn and implement personal skills to manage challenging situations related to trauma. Target Date: 2022-01-15 Frequency: Weekly Modality: individual Progress: 100% 4. Related Problem: Eliminate or reduce the negative impact trauma related symptoms have on  social, occupational, and family functioning. Description: Participate in Cognitive Processing Therapy to process the trauma and reduce its  impact. Target Date: 2022-01-15 Frequency: Weekly Modality: individual Progress: 100% 5. Related Problem: Eliminate or reduce the negative impact trauma related symptoms have on  social, occupational, and family functioning. Description: Verbalize an understanding of the treatment rationale for PTSD. Target Date: 2022-01-15 Frequency: Weekly Modality: individual Progress: 100% 6. Related Problem: Eliminate or reduce the negative impact trauma related symptoms have on  social, occupational, and family functioning. Description: Learn and implement calming skills. Target Date: 2022-01-15 Frequency: Weekly Modality: individual Progress: 90% ? Planned Intervention: Teach the client calming skills (e.g., breathing retraining, relaxation,calming self-talk) to use in and between sessions when feeling overly distressed.              [name], PsyD

## 2021-11-10 ENCOUNTER — Ambulatory Visit (INDEPENDENT_AMBULATORY_CARE_PROVIDER_SITE_OTHER): Payer: Medicare Other | Admitting: Psychology

## 2021-11-10 DIAGNOSIS — F4310 Post-traumatic stress disorder, unspecified: Secondary | ICD-10-CM | POA: Diagnosis not present

## 2021-11-10 NOTE — Progress Notes (Signed)
Date: 11/10/2021 Appointment Start Time: 8:05am (patient arrived late to the appointment) Duration: 55 minutes Provider: Clarice Pole, PsyD Type of Session: Individual Therapy  Location of Patient: Home (private location) Location of Provider: Provider's Home (private office) Type of Contact: WebEx video visit with audio  Session Content: Today's appointment was a telepsychological visit due to COVID-19. Courtney Trevino is aware it is her responsibility to secure confidentiality on her end of the session. She provided verbal consent to proceed with today's appointment. Prior to proceeding with today's appointment, Courtney Trevino's physical location at the time of this appointment was obtained as well a phone number she could be reached at in the event of technical difficulties.   This provider conducted a check-in. Courtney Trevino endorsed an overall improved mood, noting increased awareness of the importance of self-care and rest. This writer assisted her in processing her emotional experiences, reflecting and reinforcing their efforts, identifying triggers for cigarette use and formulating plans for them, problem solving, and formulating value congruent goals. Courtney Trevino described ongoing efforts to defuse from relational issues with others and speaking with supportive efforts, organizing and cleaning her apartment, and keeping set boundaries with her daughter. She noted issues with her aide have been a stressor, and through ongoing conversation concluded with plans to keep her set consequences for her aide's problematic behaviors, and waiting for an alternative agency to become available. She expressed a desire to cease her use of cigarettes, although indicated uncertainty on how to do so. She discussed common triggers for cigarette use are stress and relational issues. She concluded with a goal of no more than five cigarettes a day with plans to engage in light exercise or cleaning, spiritual practices, speaking with a  friend, and having something to "chew on" during urges as well as to limit her access to cigarettes by having a friend hide them. She denied having any other issues or concerns.   Courtney Trevino did not endorse experiencing suicidal or homicidal ideation, plan, or intent since last meeting with this Probation officer and described future plans that include ongoing efforts to improve her wellbeing.   Alexiyah was receptive to today's appointment as evidenced by openness to sharing and responsiveness to feedback.  Interventions:  Provided empathic reflections and validation Reviewed content from the previous session Provided positive reinforcement Employed supportive psychotherapy interventions to facilitate reduced distress and to improve coping skills with identified stressors Engaged patient in goal setting Engaged patient in problem solving Psychoeducation provided regarding cognitive distortions  Reviewed learned skills Formulated strategies for smoking cessation and stress relief  DSM-5 Diagnosis(es):  F43.10 Posttraumtic Stress Disorder  Plan: Courtney Trevino is currently scheduled for a follow-up appointment on 11/24/2021 at 8am via WebEx video visit with audio. The next session will focus on value congruent actions and smoking cessation.    Treatment Goal & Progress:  1. Related Problem: Eliminate or reduce the negative impact trauma related symptoms have on  social, occupational, and family functioning. Description: Routinely use discussed therapeutic skills during stressors, relational issues,  and/or trauma reminders. Target Date: 2022-01-15 Frequency: Weekly Modality: individual Progress: 90%  2. Related Problem: Eliminate or reduce the negative impact trauma related symptoms have on  social, occupational, and family functioning. Description: Participate in Acceptance and Commitment Therapy (ACT) to reduce the impact  of the traumatic event. Target Date: 2022-01-15 Frequency:  Weekly Modality: individual Progress: 100% ? Planned Intervention: Use an ACT approach to PTSD to help the client experience and  accept the presence of troubling thoughts and images  without being overly impacted by  them, and committing his/her time and efforts to activities that are consistent with  identified, personally meaningful values. 3. Related Problem: Eliminate or reduce the negative impact trauma related symptoms have on social, occupational, and family functioning. Description: Learn and implement personal skills to manage challenging situations related to trauma. Target Date: 2022-01-15 Frequency: Weekly Modality: individual Progress: 100% 4. Related Problem: Eliminate or reduce the negative impact trauma related symptoms have on  social, occupational, and family functioning. Description: Participate in Cognitive Processing Therapy to process the trauma and reduce its  impact. Target Date: 2022-01-15 Frequency: Weekly Modality: individual Progress: 100% 5. Related Problem: Eliminate or reduce the negative impact trauma related symptoms have on  social, occupational, and family functioning. Description: Verbalize an understanding of the treatment rationale for PTSD. Target Date: 2022-01-15 Frequency: Weekly Modality: individual Progress: 100% 6. Related Problem: Eliminate or reduce the negative impact trauma related symptoms have on  social, occupational, and family functioning. Description: Learn and implement calming skills. Target Date: 2022-01-15 Frequency: Weekly Modality: individual Progress: 90% ? Planned Intervention: Teach the client calming skills (e.g., breathing retraining, relaxation,calming self-talk) to use in and between sessions when feeling overly distressed.             [name], PsyD

## 2021-11-24 ENCOUNTER — Ambulatory Visit (INDEPENDENT_AMBULATORY_CARE_PROVIDER_SITE_OTHER): Payer: Medicare Other | Admitting: Psychology

## 2021-11-24 DIAGNOSIS — F4310 Post-traumatic stress disorder, unspecified: Secondary | ICD-10-CM

## 2021-11-24 NOTE — Progress Notes (Signed)
Date: 11/24/2021 Appointment Start Time: 8:02am Duration: 57 minutes Provider: Helmut Muster, PsyD Type of Session: Individual Therapy  Location of Patient: Home (private location) Location of Provider: Provider's Home (private office) Type of Contact: WebEx video visit with audio  Session Content: Today's appointment was a telepsychological visit due to COVID-19. Courtney Trevino is aware it is her responsibility to secure confidentiality on her end of the session. She provided verbal consent to proceed with today's appointment. Prior to proceeding with today's appointment, Courtney Trevino's physical location at the time of this appointment was obtained as well a phone number she could be reached at in the event of technical difficulties.   This provider conducted a check-in.Courtney Trevino endorsed stress secondary to her medical status and ongoing medical treatment and changes. This Clinical research associate assisted her in processing her emotional experiences, reflected and reinforced her efforts, problem solved stressors, and formulated value congruent plans. Courtney Trevino discussed how comments from medical staff and managing responsibilities, supporting a friend with her medical issues, identifying warning signs for her emotional and physical wellbeing, and formulating plans. She discussed how medical staff are mentioning she is "stressed" and recommended a "psych eval," sharing a belief that managing the various appointments, recommendations, messages from medical staff, and ongoing medical issues is a primary stressor at the moment as well as how some recommendations are currently not feasible for her. 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  She stated plans to continue these efforts, to keep boundaries she has set with various others, and to express her emotional experiences and needs to supportive others. She denied having any other issues or concerns.   [name] did not endorse experiencing suicidal or homicidal ideation, plan, or intent since last meeting with this Clinical research associate and described future plans that include ongoing efforts to improve their wellbeing.   Courtney Trevino was receptive to today's appointment as evidenced by openness to sharing and responsiveness to feedback.  Interventions:  Provided empathic reflections and validation Reviewed content from the previous session Provided positive reinforcement Employed supportive psychotherapy interventions to facilitate reduced distress and to improve coping skills with identified stressors Engaged patient in goal setting Engaged patient in problem solving Psychoeducation provided regarding cognitive distortions  Explored patient's values  Reviewed learned skills  DSM-5 Diagnosis(es):  F43.10 Posttraumtic Stress Disorder  Plan: Courtney Trevino is currently scheduled for a follow-up appointment on 12/08/2021 at 8am via WebEx video visit with audio. The next session will focus on value congruent actions.    Treatment Goal & Progress:  1. Related Problem: Eliminate or reduce the negative impact trauma related symptoms have on  social, occupational, and family functioning. Description: Routinely use discussed therapeutic skills during stressors, relational issues,  and/or trauma reminders. Target Date: 2022-01-15 Frequency: Weekly Modality: individual Progress: 90%  2. Related Problem: Eliminate or reduce the negative impact trauma related  symptoms have on  social, occupational, and family functioning. Description: Participate in Acceptance and Commitment Therapy (ACT) to reduce the impact  of the traumatic event. Target Date: 2022-01-15 Frequency: Weekly Modality: individual Progress: 100% ? Planned Intervention:  Use an ACT approach to PTSD to help the client experience and  accept the presence of troubling thoughts and images without being overly impacted by  them, and committing his/her time and efforts to activities that are consistent with  identified, personally meaningful values. 3. Related Problem: Eliminate or reduce the negative impact trauma related symptoms have on social, occupational, and family functioning. Description: Learn and implement personal skills to manage challenging situations related to trauma. Target Date: 2022-01-15 Frequency: Weekly Modality: individual Progress: 100% 4. Related Problem: Eliminate or reduce the negative impact trauma related symptoms have on  social, occupational, and family functioning. Description: Participate in Cognitive Processing Therapy to process the trauma and reduce its  impact. Target Date: 2022-01-15 Frequency: Weekly Modality: individual Progress: 100% 5. Related Problem: Eliminate or reduce the negative impact trauma related symptoms have on  social, occupational, and family functioning. Description: Verbalize an understanding of the treatment rationale for PTSD. Target Date: 2022-01-15 Frequency: Weekly Modality: individual Progress: 100% 6. Related Problem: Eliminate or reduce the negative impact trauma related symptoms have on  social, occupational, and family functioning. Description: Learn and implement calming skills. Target Date: 2022-01-15 Frequency: Weekly Modality: individual Progress: 90% ? Planned Intervention: Teach the client calming skills (e.g., breathing retraining, relaxation,calming self-talk) to use in and between sessions when  feeling overly distressed.             Courtney Lory, PsyD

## 2021-12-08 ENCOUNTER — Ambulatory Visit (INDEPENDENT_AMBULATORY_CARE_PROVIDER_SITE_OTHER): Payer: Medicare Other | Admitting: Psychology

## 2021-12-08 DIAGNOSIS — F4310 Post-traumatic stress disorder, unspecified: Secondary | ICD-10-CM | POA: Diagnosis not present

## 2021-12-08 NOTE — Progress Notes (Signed)
Date: 12/08/2021 Appointment Start Time: 8am Duration: 59 minutes Provider: Clarice Pole, PsyD Type of Session: Individual Therapy  Location of Patient: Home (private location) Location of Provider: Provider's Home (private office) Type of Contact: WebEx video visit with audio  Session Content: Today's appointment was a telepsychological visit due to COVID-19. Courtney Trevino is aware it is her responsibility to secure confidentiality on her end of the session. She provided verbal consent to proceed with today's appointment. Prior to proceeding with today's appointment, Courtney Trevino's physical location at the time of this appointment was obtained as well a phone number she could be reached at in the event of technical difficulties.   This provider conducted a check-in. Courtney Trevino endorsed an improved mood, noting how she has been focusing on figuring out her medications and stabilizing her blood pressure as well as determining best ways to spend her energy. This Probation officer assisted her in processing her emotional experiences, reflected and reinforced her efforts and strengths, problem solved stressors, and formulated value congruent goals. Courtney Trevino discussed various examples of how forgiving others, expressing gratitude, abilities to recognize patterns, consideration of her values, and acceptance allowed her to act in value congruent ways and break maladaptive patterns she was in or observed others in around her. She further discussed how these strengths have been helpful in reducing current stressors, which has led to her blood pressure stabilizing and other's commenting on the positive changes they are noticing. She concluded with plans to continue spending her energy on her medical status and wellbeing as well as finding a new home care assistant. She denied having any other issues or concerns, and expressed appreciation for this writer's services.   Courtney Trevino did not endorse experiencing suicidal or homicidal  ideation, plan, or intent since last meeting with this Probation officer and described future plans that include ongoing efforts to improve their wellbeing.   Courtney Trevino was receptive to today's appointment as evidenced by openness to sharing and responsiveness to feedback.  Interventions:  Provided empathic reflections and validation Reviewed content from the previous session Provided positive reinforcement Employed supportive psychotherapy interventions to facilitate reduced distress and to improve coping skills with identified stressors Engaged patient in goal setting Engaged patient in problem solving Explored patient's values  Reviewed learned skills  DSM-5 Diagnosis(es):  F43.10 Posttraumtic Stress Disorder  Plan: Courtney Trevino is currently scheduled for a follow-up appointment on 12/22/2021 at 8am via WebEx video visit with audio. The next session will focus on value congruent actions.    Treatment Goal & Progress:  1. Related Problem: Eliminate or reduce the negative impact trauma related symptoms have on  social, occupational, and family functioning. Description: Routinely use discussed therapeutic skills during stressors, relational issues,  and/or trauma reminders. Target Date: 2022-01-15 Frequency: Weekly Modality: individual Progress: 90%  2. Related Problem: Eliminate or reduce the negative impact trauma related symptoms have on  social, occupational, and family functioning. Description: Participate in Acceptance and Commitment Therapy (ACT) to reduce the impact  of the traumatic event. Target Date: 2022-01-15 Frequency: Weekly Modality: individual Progress: 100% ? Planned Intervention: Use an ACT approach to PTSD to help the client experience and  accept the presence of troubling thoughts and images without being overly impacted by  them, and committing his/her time and efforts to activities that are consistent with  identified, personally meaningful values. 3. Related Problem:  Eliminate or reduce the negative impact trauma related symptoms have on social, occupational, and family functioning. Description: Learn and implement personal skills to manage challenging situations  related to trauma. Target Date: 2022-01-15 Frequency: Weekly Modality: individual Progress: 100% 4. Related Problem: Eliminate or reduce the negative impact trauma related symptoms have on  social, occupational, and family functioning. Description: Participate in Cognitive Processing Therapy to process the trauma and reduce its  impact. Target Date: 2022-01-15 Frequency: Weekly Modality: individual Progress: 100% 5. Related Problem: Eliminate or reduce the negative impact trauma related symptoms have on  social, occupational, and family functioning. Description: Verbalize an understanding of the treatment rationale for PTSD. Target Date: 2022-01-15 Frequency: Weekly Modality: individual Progress: 100% 6. Related Problem: Eliminate or reduce the negative impact trauma related symptoms have on  social, occupational, and family functioning. Description: Learn and implement calming skills. Target Date: 2022-01-15 Frequency: Weekly Modality: individual Progress: 90% ? Planned Intervention: Teach the client calming skills (e.g., breathing retraining, relaxation,calming self-talk) to use in and between sessions when feeling overly distressed.               [name], PsyD

## 2021-12-22 ENCOUNTER — Ambulatory Visit (INDEPENDENT_AMBULATORY_CARE_PROVIDER_SITE_OTHER): Payer: Medicare Other | Admitting: Psychology

## 2021-12-22 DIAGNOSIS — F4310 Post-traumatic stress disorder, unspecified: Secondary | ICD-10-CM

## 2021-12-22 NOTE — Progress Notes (Signed)
Date: 12/22/2021 Appointment Start Time: 8:04am Duration: 55 minutes Provider: Helmut Muster, PsyD Type of Session: Individual Therapy  Location of Patient: Home (private location) Location of Provider: Provider's Home (private office) Type of Contact: WebEx video visit with audio  Session Content: Today's appointment was a telepsychological visit due to COVID-19. Courtney Trevino is aware it is her responsibility to secure confidentiality on her end of the session. She provided verbal consent to proceed with today's appointment. Prior to proceeding with today's appointment, Courtney Trevino's physical location at the time of this appointment was obtained as well a phone number she could be reached at in the event of technical difficulties.   This provider conducted a check-in. Courtney Trevino reported a low mood, noting she has been identifying warning signs for her emotional wellbeing. This Clinical research associate assisted the patient in processing their emotional experiences, reflected and reinforced their efforts, problem solved stressors, and formulated value congruent goals..  Courtney Trevino discussed how lingering cognitive (e.g., word finding difficulties and forgetfulness) and medical (e.g., blood pressure fluctuations) have led to stress and decreased desire to be out of her home. She further discussed she caught warning signs of this (e.g., fridge being less stocked) and has prioritized her wellbeing, keeping in contact with others, following all medical recommendations, made plans to get groceries today, and accepted invites to various social events she is excited about. She noted how she is increasingly defusing from stressful and unhelpful interactions with others and has an alternate plan should it be needed regarding her medical care. She denied having any other issues or concerns.   Courtney Trevino did not endorse experiencing suicidal or homicidal ideation, plan, or intent since last meeting with this Clinical research associate and described future  plans that include ongoing efforts to improve their wellbeing.   Courtney Trevino was receptive to today's appointment as evidenced by openness to sharing and responsiveness to feedback.  Interventions:  Provided empathic reflections and validation Reviewed content from the previous session Provided positive reinforcement Employed supportive psychotherapy interventions to facilitate reduced distress and to improve coping skills with identified stressors Engaged patient in goal setting Engaged patient in problem solving Psychoeducation provided regarding self-care Explored patient's values   DSM-5 Diagnosis(es):  F43.10 Posttraumtic Stress Disorder  Plan: Courtney Trevino is currently scheduled for a follow-up appointment on 01/05/2022 at 8am via WebEx video visit with audio. The next session will focus on value congruent actions.   Treatment Goal & Progress:  1. Related Problem: Eliminate or reduce the negative impact trauma related symptoms have on  social, occupational, and family functioning. Description: Routinely use discussed therapeutic skills during stressors, relational issues,  and/or trauma reminders. Target Date: 2022-01-15 Frequency: Weekly Modality: individual Progress: 90%  2. Related Problem: Eliminate or reduce the negative impact trauma related symptoms have on  social, occupational, and family functioning. Description: Participate in Acceptance and Commitment Therapy (ACT) to reduce the impact  of the traumatic event. Target Date: 2022-01-15 Frequency: Weekly Modality: individual Progress: 100% ? Planned Intervention: Use an ACT approach to PTSD to help the client experience and  accept the presence of troubling thoughts and images without being overly impacted by  them, and committing his/her time and efforts to activities that are consistent with  identified, personally meaningful values. 3. Related Problem: Eliminate or reduce the negative impact trauma related symptoms  have on social, occupational, and family functioning. Description: Learn and implement personal skills to manage challenging situations related to trauma. Target Date: 2022-01-15 Frequency: Weekly Modality: individual Progress: 100% 4. Related Problem: Eliminate  or reduce the negative impact trauma related symptoms have on  social, occupational, and family functioning. Description: Participate in Cognitive Processing Therapy to process the trauma and reduce its  impact. Target Date: 2022-01-15 Frequency: Weekly Modality: individual Progress: 100% 5. Related Problem: Eliminate or reduce the negative impact trauma related symptoms have on  social, occupational, and family functioning. Description: Verbalize an understanding of the treatment rationale for PTSD. Target Date: 2022-01-15 Frequency: Weekly Modality: individual Progress: 100% 6. Related Problem: Eliminate or reduce the negative impact trauma related symptoms have on  social, occupational, and family functioning. Description: Learn and implement calming skills. Target Date: 2022-01-15 Frequency: Weekly Modality: individual Progress: 90% ? Planned Intervention: Teach the client calming skills (e.g., breathing retraining, relaxation,calming self-talk) to use in and between sessions when feeling overly distressed.              Courtney Gouge, PsyD

## 2022-01-05 ENCOUNTER — Ambulatory Visit (INDEPENDENT_AMBULATORY_CARE_PROVIDER_SITE_OTHER): Payer: Medicare Other | Admitting: Psychology

## 2022-01-05 DIAGNOSIS — F4310 Post-traumatic stress disorder, unspecified: Secondary | ICD-10-CM | POA: Diagnosis not present

## 2022-01-05 NOTE — Progress Notes (Signed)
Date: 01/05/2022 Appointment Start Time: 8:05am Duration: 57 minutes Provider: Helmut Muster, PsyD Type of Session: Individual Therapy  Location of Patient: Home (private location) Location of Provider: Provider's Home (private office) Type of Contact: WebEx video visit with audio  Session Content: Today's appointment was a telepsychological visit due to COVID-19. Courtney Trevino is aware it is her responsibility to secure confidentiality on her end of the session. She provided verbal consent to proceed with today's appointment. Prior to proceeding with today's appointment, Courtney Trevino's physical location at the time of this appointment was obtained as well a phone number she could be reached at in the event of technical difficulties.   This provider conducted a check-in. Courtney Trevino endorsed experiencing anger as a result of a medication being unavailable. This Clinical research associate assisted her in processing her emotional experiences, reflected and reinforced her efforts, problem solved stressors, and formulated value congruent goals. Courtney Trevino discussed how she has been continuing efforts to defuse from unhelpful thoughts and maladaptive behavioral patterns with others, although noted an example of experiencing anger that elevated her blood pressure. She further discussed how her anger stemmed from a belief that the pharmacy could have planned better to ensure they did not run out of stock of one of her medications as well as concerns about its impact on her physical wellbeing. She noted how despite the anger she left the situation before having a verbal outburst and followed recommended medical advice of engaging in relaxation-based strategies and activities. She also noted plans to attend upcoming social events, transfer her prescription to a new pharmacy, ,to utilize relaxation strategies before speaking with the pharmacy when attempting to refill the prescription again, and to end the conversation and park her car and  utilize relaxation strategies if needed. She denied having any other issues or concerns.   Courtney Trevino did not endorse experiencing suicidal or homicidal ideation, plan, or intent since last meeting with this Clinical research associate and described future plans that include ongoing efforts to improve her wellbeing.   Courtney Trevino was receptive to today's appointment as evidenced by openness to sharing and responsiveness to feedback.  Interventions:  Provided empathic reflections and validation Reviewed content from the previous session Provided positive reinforcement Employed supportive psychotherapy interventions to facilitate reduced distress and to improve coping skills with identified stressors Engaged patient in goal setting Engaged patient in problem solving Psychoeducation provided regarding cognitive distortions  Psychoeducation provided regarding self-care  DSM-5 Diagnosis(es):  F43.10 Posttraumtic Stress Disorder  Plan: Courtney Trevino is currently scheduled for a follow-up appointment on 01/19/2022 at 8am via WebEx video visit with audio. The next session will focus on value congruent actions and use of relaxation strategies.   Treatment Goal & Progress:  1. Related Problem: Eliminate or reduce the negative impact trauma related symptoms have on  social, occupational, and family functioning. Description: Routinely use discussed therapeutic skills during stressors, relational issues,  and/or trauma reminders. Target Date: 2022-01-15 Frequency: Weekly Modality: individual Progress: 90%  2. Related Problem: Eliminate or reduce the negative impact trauma related symptoms have on  social, occupational, and family functioning. Description: Participate in Acceptance and Commitment Therapy (ACT) to reduce the impact  of the traumatic event. Target Date: 2022-01-15 Frequency: Weekly Modality: individual Progress: 100% ? Planned Intervention: Use an ACT approach to PTSD to help the client experience and   accept the presence of troubling thoughts and images without being overly impacted by  them, and committing his/her time and efforts to activities that are consistent with  identified, personally meaningful  values. 3. Related Problem: Eliminate or reduce the negative impact trauma related symptoms have on social, occupational, and family functioning. Description: Learn and implement personal skills to manage challenging situations related to trauma. Target Date: 2022-01-15 Frequency: Weekly Modality: individual Progress: 100% 4. Related Problem: Eliminate or reduce the negative impact trauma related symptoms have on  social, occupational, and family functioning. Description: Participate in Cognitive Processing Therapy to process the trauma and reduce its  impact. Target Date: 2022-01-15 Frequency: Weekly Modality: individual Progress: 100% 5. Related Problem: Eliminate or reduce the negative impact trauma related symptoms have on  social, occupational, and family functioning. Description: Verbalize an understanding of the treatment rationale for PTSD. Target Date: 2022-01-15 Frequency: Weekly Modality: individual Progress: 100% 6. Related Problem: Eliminate or reduce the negative impact trauma related symptoms have on  social, occupational, and family functioning. Description: Learn and implement calming skills. Target Date: 2022-01-15 Frequency: Weekly Modality: individual Progress: 90% ? Planned Intervention: Teach the client calming skills (e.g., breathing retraining, relaxation,calming self-talk) to use in and between sessions when feeling overly distressed.              [name], PsyD

## 2022-01-19 ENCOUNTER — Ambulatory Visit (INDEPENDENT_AMBULATORY_CARE_PROVIDER_SITE_OTHER): Payer: Medicare Other | Admitting: Psychology

## 2022-01-19 DIAGNOSIS — F4310 Post-traumatic stress disorder, unspecified: Secondary | ICD-10-CM | POA: Diagnosis not present
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
## 2022-02-02 ENCOUNTER — Ambulatory Visit (INDEPENDENT_AMBULATORY_CARE_PROVIDER_SITE_OTHER): Payer: Medicare Other | Admitting: Psychology

## 2022-02-02 DIAGNOSIS — F4310 Post-traumatic stress disorder, unspecified: Secondary | ICD-10-CM | POA: Diagnosis not present

## 2022-02-02 NOTE — Progress Notes (Signed)
Date: 12/202/2023 Appointment Start Time: 8:03am Duration: 62 minutes Provider: Helmut Muster, PsyD Type of Session: Individual Therapy  Location of Patient: Home (private location) Location of Provider: Provider's Home (private office) Type of Contact: WebEx video visit with audio  Session Content: Today's appointment was a telepsychological visit due to COVID-19. Courtney Trevino is aware it is her responsibility to secure confidentiality on her end of the session. She provided verbal consent to proceed with today's appointment. Prior to proceeding with today's appointment, Courtney Trevino's physical location at the time of this appointment was obtained as well a phone number she could be reached at in the event of technical difficulties.   This provider conducted a check-in. Courtney Trevino endorsed anxiety and anger secondary to various situations in which she feels a loss of control and not feeling heard. This Clinical research associate assisted her in processing her emotional experiences, reflecting and reinforcing her efforts, problem solving stressors, and formulating plans for upcoming interactions that could be upsetting. Courtney Trevino discussed how responses and various snags regarding getting her car fixed, her brother's recent phone call, and ongoing medical issues have led to trauma-related responses (e.g., anger and being prone to becoming aggressive, beliefs she is "being blamed for it" or "taken advantage of," and concerns about loss of control). Through conversation, she described how these reactions make it more likely that her interactions becoming negative and reinforce the trauma-related beliefs. She concluded with plans to identify warning signs (e.g., not feeling heard or understood), being mindful of her nonverbal cues and emotional states, to use a stress "sponge," use relaxation strategies before speaking with a person about an upsetting topic, and to remind herself their behaviors are not personal and how the current  situation is different from past trauma. She denied having any other issues or concerns.   Courtney Trevino did not endorse experiencing suicidal or homicidal ideation, plan, or intent since last meeting with this Clinical research associate and described future plans that include ongoing efforts to improve their wellbeing.   Courtney Trevino was receptive to today's appointment as evidenced by openness to sharing and responsiveness to feedback.  Interventions:  Provided empathic reflections and validation Reviewed content from the previous session Provided positive reinforcement Employed supportive psychotherapy interventions to facilitate reduced distress and to improve coping skills with identified stressors Engaged patient in goal setting Engaged patient in problem solving Psychoeducation provided regarding cognitive distortions  Psychoeducation provided regarding self-care Explored patient's values  Reviewed learned skills  DSM-5 Diagnosis(es):  F43.10 Posttraumtic Stress Disorder  Plan: [name] is currently scheduled for a follow-up appointment on 02/16/2021 at 8am via WebEx video visit with audio. The next session will focus on value congruent actions.    Treatment Goal & Progress:  1. Related Problem: Eliminate or reduce the negative impact trauma related symptoms have on  social, occupational, and family functioning. Description: Routinely use discussed therapeutic skills during stressors, relational issues,  and/or trauma reminders. Target Date: 2023-01-16 Frequency: Weekly Modality: individual Progress: 90%  2. Related Problem: Eliminate or reduce the negative impact trauma related symptoms have on  social, occupational, and family functioning. Description: Participate in Acceptance and Commitment Therapy (ACT) to reduce the impact  of the traumatic event. Target Date: 2022-01-15 Frequency: Weekly Modality: individual Progress: 100% ? Planned Intervention: Use an ACT approach to PTSD to help the  client experience and  accept the presence of troubling thoughts and images without being overly impacted by  them, and committing his/her time and efforts to activities that are consistent with  identified, personally  meaningful values. 3. Related Problem: Eliminate or reduce the negative impact trauma related symptoms have on social, occupational, and family functioning. Description: Learn and implement personal skills to manage challenging situations related to trauma. Target Date: 2022-01-15 Frequency: Weekly Modality: individual Progress: 100% 4. Related Problem: Eliminate or reduce the negative impact trauma related symptoms have on  social, occupational, and family functioning. Description: Participate in Cognitive Processing Therapy to process the trauma and reduce its  impact. Target Date: 2022-01-15 Frequency: Weekly Modality: individual Progress: 100% 5. Related Problem: Eliminate or reduce the negative impact trauma related symptoms have on  social, occupational, and family functioning. Description: Verbalize an understanding of the treatment rationale for PTSD. Target Date: 2022-01-15 Frequency: Weekly Modality: individual Progress: 100% 6. Related Problem: Eliminate or reduce the negative impact trauma related symptoms have on  social, occupational, and family functioning. Description: Learn and implement calming skills. Target Date: 2023-01-16 Frequency: Weekly Modality: individual Progress: 90% ? Planned Intervention: Teach the client calming skills (e.g., breathing retraining, relaxation,calming self-talk) to use in and between sessions when feeling overly distressed.             [name], PsyD

## 2022-02-16 ENCOUNTER — Ambulatory Visit (INDEPENDENT_AMBULATORY_CARE_PROVIDER_SITE_OTHER): Payer: Medicare Other | Admitting: Psychology

## 2022-02-16 DIAGNOSIS — F4310 Post-traumatic stress disorder, unspecified: Secondary | ICD-10-CM | POA: Diagnosis not present

## 2022-02-16 NOTE — Progress Notes (Signed)
Date: 02/16/2022 Appointment Start Time: 8:02am Duration: 58 minutes Provider: Clarice Pole, PsyD Type of Session: Individual Therapy  Location of Patient: Work (private location) Location of Provider: Provider's Home (private office) Type of Contact: WebEx video visit with audio  Session Content: Today's appointment was a telepsychological visit due to COVID-19. Courtney Trevino is aware it is her responsibility to secure confidentiality on her end of the session. She provided verbal consent to proceed with today's appointment. Prior to proceeding with today's appointment, Courtney Trevino's physical location at the time of this appointment was obtained as well a phone number she could be reached at in the event of technical difficulties.   This provider conducted a check-in. Courtney Trevino endorsed an overall improved mood, although noted concerns about ongoing stressors and relational dynamics that cause reduced communication with others. This Probation officer assisted her in processing her emotional experiences, reflecting and reinforcing her efforts, problem solving stressors, identifying childhood experiences impact on her current communication styles and behavior, and formulating value congruent goals. Courtney Trevino discussed how her childhood experiences and witnessing of the negative repercussions of other's procrastination and/or dishonesty had on her and them led to her desiring to always have a plan, addressing problems quickly, and being honest regardless of the negative repercussions that may come from doing so, noting how this can cause relational strains and reduced communication with others when she indicates they should be doing the same. Through conversation, she concluded with plans to remind herself that others process and engage with problems differently than her which may cause emotional upset, to try to catch urges to ask others "what's the plan?" when they appear to desire processing and talking out loud plans  they may not have considered yet, to mute the phone to express her own frustration, and/or hang up if she finds herself about to say something she may later regret. She denied having any other issues or concerns.   Courtney Trevino did not endorse experiencing suicidal or homicidal ideation, plan, or intent since last meeting with this Probation officer and described future plans that include ongoing efforts to improve their wellbeing.   Courtney Trevino was receptive to today's appointment as evidenced by openness to sharing and responsiveness to feedback.  Interventions:  Provided empathic reflections and validation Reviewed content from the previous session Engaged patient in goal setting Engaged patient in problem solving Explored patient's values  Reviewed learned skills  DSM-5 Diagnosis(es):  F43.10 Posttraumtic Stress Disorder  Plan: Courtney Trevino is currently scheduled for a follow-up appointment on 03/02/2021 at 8am via WebEx video visit with audio. The next session will focus on value congruent actions.   Treatment Goal & Progress:  1. Related Problem: Eliminate or reduce the negative impact trauma related symptoms have on  social, occupational, and family functioning. Description: Routinely use discussed therapeutic skills during stressors, relational issues,  and/or trauma reminders. Target Date: 2023-01-16 Frequency: Weekly Modality: individual Progress: 90%  2. Related Problem: Eliminate or reduce the negative impact trauma related symptoms have on  social, occupational, and family functioning. Description: Participate in Acceptance and Commitment Therapy (ACT) to reduce the impact  of the traumatic event. Target Date: 2022-01-15 Frequency: Weekly Modality: individual Progress: 100% ? Planned Intervention: Use an ACT approach to PTSD to help the client experience and  accept the presence of troubling thoughts and images without being overly impacted by  them, and committing his/her time and  efforts to activities that are consistent with  identified, personally meaningful values. 3. Related Problem: Eliminate or reduce the negative  impact trauma related symptoms have on social, occupational, and family functioning. Description: Learn and implement personal skills to manage challenging situations related to trauma. Target Date: 2022-01-15 Frequency: Weekly Modality: individual Progress: 100% 4. Related Problem: Eliminate or reduce the negative impact trauma related symptoms have on  social, occupational, and family functioning. Description: Participate in Cognitive Processing Therapy to process the trauma and reduce its  impact. Target Date: 2022-01-15 Frequency: Weekly Modality: individual Progress: 100% 5. Related Problem: Eliminate or reduce the negative impact trauma related symptoms have on  social, occupational, and family functioning. Description: Verbalize an understanding of the treatment rationale for PTSD. Target Date: 2022-01-15 Frequency: Weekly Modality: individual Progress: 100% 6. Related Problem: Eliminate or reduce the negative impact trauma related symptoms have on  social, occupational, and family functioning. Description: Learn and implement calming skills. Target Date: 2023-01-16 Frequency: Weekly Modality: individual Progress: 90% ? Planned Intervention: Teach the client calming skills (e.g., breathing retraining, relaxation,calming self-talk) to use in and between sessions when feeling overly distressed.              [name], PsyD

## 2022-03-02 ENCOUNTER — Ambulatory Visit (INDEPENDENT_AMBULATORY_CARE_PROVIDER_SITE_OTHER): Payer: 59 | Admitting: Psychology

## 2022-03-02 DIAGNOSIS — F4310 Post-traumatic stress disorder, unspecified: Secondary | ICD-10-CM

## 2022-03-02 NOTE — Progress Notes (Signed)
Date: 03/02/2022 Appointment Start Time: 8am Duration: 60 minutes Provider: Clarice Pole, PsyD Type of Session: Individual Therapy  Location of Patient: Home (private location) Location of Provider: Provider's Home (private office) Type of Contact: WebEx video visit with audio  Session Content: Today's appointment was a telepsychological visit due to COVID-19. Courtney Trevino is aware it is her responsibility to secure confidentiality on her end of the session. She provided verbal consent to proceed with today's appointment. Prior to proceeding with today's appointment, Courtney Trevino's physical location at the time of this appointment was obtained as well a phone number she could be reached at in the event of technical difficulties.   This provider conducted a check-in.Courtney Trevino endorsed increased use of self-care and defusion from concerns about other's behaviors. This Probation officer assisted her in processing her emotional experiences, reflecting and reinforcing her efforts, problem solving stressors, challenging thoughts related to a relational issue that occurred, and formulating value congruent goals. Courtney Trevino discussed examples of various stressors, although noted how she is increasingly defusing from stressors outside her control as well as prioritizing her energy towards self-care, addressing problems within her control, and supportive relationships. She concluded with plans to change her PCP after the next appointment given her concerns about medical care, keeping contact with a supportive friend, and potentially speaking with her renter's insurance about options to address a home issue. She denied having any other issues or concerns.   Ms. Scheu did not endorse experiencing suicidal or homicidal ideation, plan, or intent since last meeting with this Probation officer and described future plans that include ongoing efforts to improve their wellbeing.   Courtney Trevino was receptive to today's appointment as evidenced by  openness to sharing and responsiveness to feedback.  Interventions:  Provided empathic reflections and validation Reviewed content from the previous session Provided positive reinforcement Employed supportive psychotherapy interventions to facilitate reduced distress and to improve coping skills with identified stressors Engaged patient in goal setting Engaged patient in problem solving Psychoeducation provided regarding cognitive distortions  Psychoeducation provided regarding self-care Explored patient's values  Reviewed learned skills  DSM-5 Diagnosis(es):  F43.10 Posttraumtic Stress Disorder  Plan: Courtney Trevino is currently scheduled for a follow-up appointment on 03/16/2022 at 8am via WebEx video visit with audio. The next session will focus on value congruent actions.    Treatment Goal & Progress:  1. Related Problem: Eliminate or reduce the negative impact trauma related symptoms have on  social, occupational, and family functioning. Description: Routinely use discussed therapeutic skills during stressors, relational issues,  and/or trauma reminders. Target Date: 2023-01-16 Frequency: Weekly Modality: individual Progress: 90%  2. Related Problem: Eliminate or reduce the negative impact trauma related symptoms have on  social, occupational, and family functioning. Description: Participate in Acceptance and Commitment Therapy (ACT) to reduce the impact  of the traumatic event. Target Date: 2022-01-15 Frequency: Weekly Modality: individual Progress: 100% ? Planned Intervention: Use an ACT approach to PTSD to help the client experience and  accept the presence of troubling thoughts and images without being overly impacted by  them, and committing his/her time and efforts to activities that are consistent with  identified, personally meaningful values. 3. Related Problem: Eliminate or reduce the negative impact trauma related symptoms have on social, occupational, and family  functioning. Description: Learn and implement personal skills to manage challenging situations related to trauma. Target Date: 2022-01-15 Frequency: Weekly Modality: individual Progress: 100% 4. Related Problem: Eliminate or reduce the negative impact trauma related symptoms have on  social, occupational, and family functioning. Description:  Participate in Cognitive Processing Therapy to process the trauma and reduce its  impact. Target Date: 2022-01-15 Frequency: Weekly Modality: individual Progress: 100% 5. Related Problem: Eliminate or reduce the negative impact trauma related symptoms have on  social, occupational, and family functioning. Description: Verbalize an understanding of the treatment rationale for PTSD. Target Date: 2022-01-15 Frequency: Weekly Modality: individual Progress: 100% 6. Related Problem: Eliminate or reduce the negative impact trauma related symptoms have on  social, occupational, and family functioning. Description: Learn and implement calming skills. Target Date: 2023-01-16 Frequency: Weekly Modality: individual Progress: 90% ? Planned Intervention: Teach the client calming skills (e.g., breathing retraining, relaxation,calming self-talk) to use in and between sessions when feeling overly distressed.              Courtney Lory, PsyD

## 2022-03-16 ENCOUNTER — Ambulatory Visit (INDEPENDENT_AMBULATORY_CARE_PROVIDER_SITE_OTHER): Payer: 59 | Admitting: Psychology

## 2022-03-16 DIAGNOSIS — F4310 Post-traumatic stress disorder, unspecified: Secondary | ICD-10-CM

## 2022-03-16 NOTE — Progress Notes (Signed)
Date: 03/16/2022 Appointment Start Time: 8:02am Duration: 56 minutes Provider: Clarice Pole, PsyD Type of Session: Individual Therapy  Location of Patient: Home (private location) Location of Provider: Provider's Home (private office) Type of Contact: WebEx video visit with audio  Session Content: Today's appointment was a telepsychological visit due to COVID-19. Lively is aware it is her responsibility to secure confidentiality on her end of the session. She provided verbal consent to proceed with today's appointment. Prior to proceeding with today's appointment, Latayna's physical location at the time of this appointment was obtained as well a phone number she could be reached at in the event of technical difficulties.   This provider conducted a check-in.Ms. Goodspeed endorsed an overall improved mood, noting how as a death anniversary event went better than prior years, physical therapy is going well, and she is prioritizing medical concerns. This Probation officer assisted her in processing her emotional experiences, reflecting and reinforcing her efforts, prioritizing concerns and problem solving stressors, and formulating value congruent goals.  Ms. Afshar discussed how various other's around her are increasingly acknowleding painful truths and focusing on future plans which has overall reduced relational strains and better allowed for grieving, physical therapy is going well, and various medical professionals have been assisting her. She also discussed ongoing relational strains with various others given the "last minute planning" and needing support due to a perceived preventable problem. Through conversation, she concluded with plans to continue her current efforts, assist others in planning when feasible, and to set boundaries or deadlines regarding her assistance. She denied having any other issues or concerns.   Ms. Bellissimo did not endorse experiencing suicidal or homicidal ideation, plan, or intent  since last meeting with this Probation officer and described future plans that include ongoing efforts to improve their wellbeing.   Stashia was receptive to today's appointment as evidenced by openness to sharing and responsiveness to feedback.  Interventions:  Provided empathic reflections and validation Reviewed content from the previous session Provided positive reinforcement Employed supportive psychotherapy interventions to facilitate reduced distress and to improve coping skills with identified stressors Engaged patient in goal setting Engaged patient in problem solving Psychoeducation provided regarding cognitive distortions  Explored patient's values  Reviewed learned skills  DSM-5 Diagnosis(es):  F43.10 Posttraumtic Stress Disorder  Plan: Prabhleen is currently scheduled for a follow-up appointment on 03/30/2022 at 8am via WebEx video visit with audio. The next session will focus on value congruent actions.   Treatment Goal & Progress:  1. Related Problem: Eliminate or reduce the negative impact trauma related symptoms have on  social, occupational, and family functioning. Description: Routinely use discussed therapeutic skills during stressors, relational issues,  and/or trauma reminders. Target Date: 2023-01-16 Frequency: Weekly Modality: individual Progress: 90%  2. Related Problem: Eliminate or reduce the negative impact trauma related symptoms have on  social, occupational, and family functioning. Description: Participate in Acceptance and Commitment Therapy (ACT) to reduce the impact  of the traumatic event. Target Date: 2022-01-15 Frequency: Weekly Modality: individual Progress: 100% ? Planned Intervention: Use an ACT approach to PTSD to help the client experience and  accept the presence of troubling thoughts and images without being overly impacted by  them, and committing his/her time and efforts to activities that are consistent with  identified, personally meaningful  values. 3. Related Problem: Eliminate or reduce the negative impact trauma related symptoms have on social, occupational, and family functioning. Description: Learn and implement personal skills to manage challenging situations related to trauma. Target Date: 2022-01-15 Frequency: Weekly Modality:  individual Progress: 100% 4. Related Problem: Eliminate or reduce the negative impact trauma related symptoms have on  social, occupational, and family functioning. Description: Participate in Cognitive Processing Therapy to process the trauma and reduce its  impact. Target Date: 2022-01-15 Frequency: Weekly Modality: individual Progress: 100% 5. Related Problem: Eliminate or reduce the negative impact trauma related symptoms have on  social, occupational, and family functioning. Description: Verbalize an understanding of the treatment rationale for PTSD. Target Date: 2022-01-15 Frequency: Weekly Modality: individual Progress: 100% 6. Related Problem: Eliminate or reduce the negative impact trauma related symptoms have on  social, occupational, and family functioning. Description: Learn and implement calming skills. Target Date: 2023-01-16 Frequency: Weekly Modality: individual Progress: 90% ? Planned Intervention: Teach the client calming skills (e.g., breathing retraining, relaxation,calming self-talk) to use in and between sessions when feeling overly distressed.                 [name], PsyD

## 2022-03-30 ENCOUNTER — Ambulatory Visit (INDEPENDENT_AMBULATORY_CARE_PROVIDER_SITE_OTHER): Payer: 59 | Admitting: Psychology

## 2022-03-30 DIAGNOSIS — F4310 Post-traumatic stress disorder, unspecified: Secondary | ICD-10-CM | POA: Diagnosis not present

## 2022-03-30 NOTE — Progress Notes (Signed)
Date: 03/30/2022 Appointment Start Time: 8:04am Duration: 56 minutes Provider: Clarice Pole, PsyD Type of Session: Individual Therapy  Location of Patient: Home (private location) Location of Provider: Provider's Home (private office) Type of Contact: WebEx video visit with audio  Session Content: Today's appointment was a telepsychological visit due to COVID-19. Courtney Trevino is aware it is her responsibility to secure confidentiality on her end of the session. She provided verbal consent to proceed with today's appointment. Prior to proceeding with today's appointment, Courtney Trevino's physical location at the time of this appointment was obtained as well a phone number she could be reached at in the event of technical difficulties.   This provider conducted a check-in. Courtney Trevino endorsed an improved mood, noting she has been focusing on self-care and exercise as well as defusing from problematic interactions with others. This Probation officer assisted her in processing her emotional experiences, reflecting and reinforcing her efforts, problem solving stressors, and formulating value congruent goals. Courtney Trevino discussed how her efforts have improved her physical and emotional wellbeing, adding how she has been balancing advocating for herself in various areas as well as defusing from situations in which others do not seem to be listening or are "manipulating" her into certain responses. Through conversation, she concluded with plans to largely continue her efforts although she would like to increasingly reach out to others while also being mindful of entering into problematic conversations or for her emotional state (e.g., beginning to pace and pressures to intervene in an effort to resolve the issue the other person is experiencing). She denied having any other issues or concerns.  Courtney Trevino did not endorse experiencing suicidal or homicidal ideation, plan, or intent since last meeting with this Probation officer and described  future plans that include ongoing efforts to improve their wellbeing.   Courtney Trevino was receptive to today's appointment as evidenced by openness to sharing and responsiveness to feedback.  Interventions:  Provided empathic reflections and validation Reviewed content from the previous session Provided positive reinforcement Employed supportive psychotherapy interventions to facilitate reduced distress and to improve coping skills with identified stressors Engaged patient in goal setting Engaged patient in problem solving Psychoeducation provided regarding self-care Explored patient's values  Reviewed learned skills  DSM-5 Diagnosis(es):  F43.10 Posttraumtic Stress Disorder  Plan: Courtney Trevino is currently scheduled for a follow-up appointment on 04/13/2022 at 8am via WebEx video visit with audio. The next session will focus on value congruent actions.   Treatment Goal & Progress:  1. Related Problem: Eliminate or reduce the negative impact trauma related symptoms have on  social, occupational, and family functioning. Description: Routinely use discussed therapeutic skills during stressors, relational issues,  and/or trauma reminders. Target Date: 2023-01-16 Frequency: Weekly Modality: individual Progress: 90%  2. Related Problem: Eliminate or reduce the negative impact trauma related symptoms have on  social, occupational, and family functioning. Description: Participate in Acceptance and Commitment Therapy (ACT) to reduce the impact  of the traumatic event. Target Date: 2022-01-15 Frequency: Weekly Modality: individual Progress: 100% ? Planned Intervention: Use an ACT approach to PTSD to help the client experience and  accept the presence of troubling thoughts and images without being overly impacted by  them, and committing his/her time and efforts to activities that are consistent with  identified, personally meaningful values. 3. Related Problem: Eliminate or reduce the negative  impact trauma related symptoms have on social, occupational, and family functioning. Description: Learn and implement personal skills to manage challenging situations related to trauma. Target Date: 2022-01-15 Frequency: Weekly Modality: individual  Progress: 100% 4. Related Problem: Eliminate or reduce the negative impact trauma related symptoms have on  social, occupational, and family functioning. Description: Participate in Cognitive Processing Therapy to process the trauma and reduce its  impact. Target Date: 2022-01-15 Frequency: Weekly Modality: individual Progress: 100% 5. Related Problem: Eliminate or reduce the negative impact trauma related symptoms have on  social, occupational, and family functioning. Description: Verbalize an understanding of the treatment rationale for PTSD. Target Date: 2022-01-15 Frequency: Weekly Modality: individual Progress: 100% 6. Related Problem: Eliminate or reduce the negative impact trauma related symptoms have on  social, occupational, and family functioning. Description: Learn and implement calming skills. Target Date: 2023-01-16 Frequency: Weekly Modality: individual Progress: 90% ? Planned Intervention: Teach the client calming skills (e.g., breathing retraining, relaxation,calming self-talk) to use in and between sessions when feeling overly distressed.              Courtney Lory, PsyD

## 2022-04-13 ENCOUNTER — Ambulatory Visit (INDEPENDENT_AMBULATORY_CARE_PROVIDER_SITE_OTHER): Payer: 59 | Admitting: Psychology

## 2022-04-13 DIAGNOSIS — F4310 Post-traumatic stress disorder, unspecified: Secondary | ICD-10-CM | POA: Diagnosis not present

## 2022-04-13 NOTE — Progress Notes (Signed)
Date: 04/13/2022 Appointment Start Time: 8:04am Duration: 57 minutes Provider: Clarice Pole, PsyD Type of Session: Individual Therapy  Location of Patient: Home (private location) Location of Provider: Provider's Home (private office) Type of Contact: WebEx video visit with audio  Session Content: Today's appointment was a telepsychological visit due to COVID-19. Courtney Trevino is aware it is her responsibility to secure confidentiality on her end of the session. She provided verbal consent to proceed with today's appointment. Prior to proceeding with today's appointment, Courtney Trevino's physical location at the time of this appointment was obtained as well a phone number she could be reached at in the event of technical difficulties.   This provider conducted a check-in. Courtney Trevino reported plans for a change one of her medical providers as well as concerns about other's behaviors.This Probation officer assisted her t in processing her emotional experiences and perceptions as well as how she wants to address various behavioral patterns off others, reflecting and reinforcing her efforts, problem solving stressors, and formulating value congruent goals. Courtney Trevino discussed examples of various relational strains with others, although noted how she has been respecting set boundaries, reducing interactions with others engaging in problematic relational patterns, considering her values and roles she wants to play with various others, giving other's space to make mistakes and determine what is best for them, adding how doing so has improved her mood and better allowed her to engage in self-care. She stated plans to consider these efforts despite the upset other's behaviors can cause, noting how use of empathy and reminding herself of her values has been helpful during periods of emotional upset. She denied having any other issues or concerns.   Courtney Trevino did not endorse experiencing suicidal or homicidal ideation, plan, or intent  since last meeting with this Probation officer and described future plans that include ongoing efforts to improve their wellbeing.    Courtney Trevino was receptive to today's appointment as evidenced by openness to sharing and responsiveness to feedback.  Interventions:  Provided empathic reflections and validation Reviewed content from the previous session Provided positive reinforcement Employed supportive psychotherapy interventions to facilitate reduced distress and to improve coping skills with identified stressors Engaged patient in goal setting Engaged patient in problem solving Psychoeducation provided regarding cognitive distortions  Psychoeducation provided regarding self-care Explored patient's values  Reviewed learned skills  DSM-5 Diagnosis(es):  F43.10 Posttraumtic Stress Disorder  Plan: Courtney Trevino is currently scheduled for a follow-up appointment on 04/27/2022 at 8am via WebEx video visit with audio. The next session will focus on value congruent actions.    Treatment Goal & Progress:  1. Related Problem: Eliminate or reduce the negative impact trauma related symptoms have on  social, occupational, and family functioning. Description: Routinely use discussed therapeutic skills during stressors, relational issues,  and/or trauma reminders. Target Date: 2023-01-16 Frequency: Weekly Modality: individual Progress: 90%  2. Related Problem: Eliminate or reduce the negative impact trauma related symptoms have on  social, occupational, and family functioning. Description: Participate in Acceptance and Commitment Therapy (ACT) to reduce the impact  of the traumatic event. Target Date: 2022-01-15 Frequency: Weekly Modality: individual Progress: 100% ? Planned Intervention: Use an ACT approach to PTSD to help the client experience and  accept the presence of troubling thoughts and images without being overly impacted by  them, and committing his/her time and efforts to activities that are  consistent with  identified, personally meaningful values. 3. Related Problem: Eliminate or reduce the negative impact trauma related symptoms have on social, occupational, and family functioning. Description: Learn  and implement personal skills to manage challenging situations related to trauma. Target Date: 2022-01-15 Frequency: Weekly Modality: individual Progress: 100% 4. Related Problem: Eliminate or reduce the negative impact trauma related symptoms have on  social, occupational, and family functioning. Description: Participate in Cognitive Processing Therapy to process the trauma and reduce its  impact. Target Date: 2022-01-15 Frequency: Weekly Modality: individual Progress: 100% 5. Related Problem: Eliminate or reduce the negative impact trauma related symptoms have on  social, occupational, and family functioning. Description: Verbalize an understanding of the treatment rationale for PTSD. Target Date: 2022-01-15 Frequency: Weekly Modality: individual Progress: 100% 6. Related Problem: Eliminate or reduce the negative impact trauma related symptoms have on  social, occupational, and family functioning. Description: Learn and implement calming skills. Target Date: 2023-01-16 Frequency: Weekly Modality: individual Progress: 90% ? Planned Intervention: Teach the client calming skills (e.g., breathing retraining, relaxation,calming self-talk) to use in and between sessions when feeling overly distressed.             [name], PsyD

## 2022-04-27 ENCOUNTER — Ambulatory Visit (INDEPENDENT_AMBULATORY_CARE_PROVIDER_SITE_OTHER): Payer: 59 | Admitting: Psychology

## 2022-04-27 DIAGNOSIS — F4310 Post-traumatic stress disorder, unspecified: Secondary | ICD-10-CM | POA: Diagnosis not present

## 2022-04-27 NOTE — Progress Notes (Signed)
Date: 04/27/2022 Appointment Start Time: 8:04am Duration: 55 minutes Provider: Clarice Pole, PsyD Type of Session: Individual Therapy  Location of Patient: Home (private location) Location of Provider: Provider's Home (private office) Type of Contact: WebEx video visit with audio  Session Content: Today's appointment was a telepsychological visit due to COVID-19. Dezeree is aware it is her responsibility to secure confidentiality on her end of the session. She provided verbal consent to proceed with today's appointment. Prior to proceeding with today's appointment, Uchenna's physical location at the time of this appointment was obtained as well a phone number she could be reached at in the event of technical difficulties.   This provider conducted a check-in. Ms. Loft endorsed an ongoing overall good moo. This Probation officer assisted her her processing her emotional experiences, reflecting and reinforcing their efforts, problem solving stressors, providing psychoeducation on working memory and short-term memory and neuropsychological services associated strategies, identifying areas of personal growth, and formulating value congruent goals. Ms. Lantagne discussed how she has observed various relational conflicts that were upsetting, although noted how she has been pausing and considering if it is worth engaging in. She also discussed how this has increasingly freed her up to prioritize self-care and efforts to improve her wellbeing. 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 She concluded with plans to implement a strategy or two from a provided list as well as to continue her current efforts to reduce engaging in maladaptive relational patterns and self-care.  Ms. Zamudio did not endorse experiencing suicidal or homicidal ideation, plan, or intent since last meeting with this Probation officer and described future plans that include ongoing  efforts to improve their wellbeing.   Jory was receptive to today's appointment as evidenced by openness to sharing and responsiveness to feedback.  Interventions:  Provided empathic reflections and validation Reviewed content from the previous session Provided positive reinforcement Employed supportive psychotherapy interventions to facilitate reduced distress and to improve coping skills with identified stressors Engaged patient in goal setting Engaged patient in problem solving Psychoeducation provided regarding cognitive distortions  Psychoeducation provided regarding self-care Explored patient's values  Reviewed learned skills  DSM-5 Diagnosis(es):  F43.10 Posttraumtic Stress Disorder  Plan: Phenicia is currently scheduled for a follow-up appointment on 05/11/2022 at 8am via WebEx video visit with audio. The next session will focus on value congruent actions.    Treatment Goal & Progress:  1. Related Problem: Eliminate or reduce the negative impact trauma related symptoms have on  social, occupational, and family functioning. Description: Routinely use discussed therapeutic skills during stressors, relational issues,  and/or trauma reminders. Target Date: 2023-01-16 Frequency: Weekly Modality: individual Progress: 90%  2. Related Problem: Eliminate or reduce the negative impact trauma related symptoms have on  social, occupational, and family functioning. Description: Participate in Acceptance and Commitment Therapy (ACT) to reduce the impact  of the traumatic event. Target Date: 2022-01-15 Frequency: Weekly Modality: individual Progress: 100% ? Planned Intervention: Use an ACT approach to PTSD to help the client experience and  accept the presence of troubling thoughts and images without being overly impacted by  them, and committing his/her time and efforts to activities that are consistent with  identified, personally meaningful values. 3. Related Problem:  Eliminate or reduce the negative impact trauma related symptoms have on social, occupational, and family functioning. Description: Learn and implement personal skills to manage challenging situations related to trauma. Target Date: 2022-01-15 Frequency: Weekly Modality: individual Progress: 100% 4. Related Problem: Eliminate or  reduce the negative impact trauma related symptoms have on  social, occupational, and family functioning. Description: Participate in Cognitive Processing Therapy to process the trauma and reduce its  impact. Target Date: 2022-01-15 Frequency: Weekly Modality: individual Progress: 100% 5. Related Problem: Eliminate or reduce the negative impact trauma related symptoms have on  social, occupational, and family functioning. Description: Verbalize an understanding of the treatment rationale for PTSD. Target Date: 2022-01-15 Frequency: Weekly Modality: individual Progress: 100% 6. Related Problem: Eliminate or reduce the negative impact trauma related symptoms have on  social, occupational, and family functioning. Description: Learn and implement calming skills. Target Date: 2023-01-16 Frequency: Weekly Modality: individual Progress: 90% ? Planned Intervention: Teach the client calming skills (e.g., breathing retraining, relaxation,calming self-talk) to use in and between sessions when feeling overly distressed.              [name], PsyD

## 2022-05-11 ENCOUNTER — Ambulatory Visit (INDEPENDENT_AMBULATORY_CARE_PROVIDER_SITE_OTHER): Payer: 59 | Admitting: Psychology

## 2022-05-11 DIAGNOSIS — F4310 Post-traumatic stress disorder, unspecified: Secondary | ICD-10-CM | POA: Diagnosis not present

## 2022-05-11 NOTE — Progress Notes (Signed)
Date: 05/11/2022 Appointment Start Time: 8:05am Duration: 55 minutes Provider: Clarice Pole, PsyD Type of Session: Individual Therapy  Location of Patient: Home (private location) Location of Provider: Provider's Home (private office) Type of Contact: WebEx video visit with audio  Session Content: Today's appointment was a telepsychological visit due to COVID-19. Courtney Trevino is aware it is her responsibility to secure confidentiality on her end of the session. She provided verbal consent to proceed with today's appointment. Prior to proceeding with today's appointment, Courtney Trevino's physical location at the time of this appointment was obtained as well a phone number she could be reached at in the event of technical difficulties.   This provider conducted a check-in. Courtney Trevino shared a desire to begin working to [facility] with being emotionally vulnerable with various others. This Probation officer assisted her in processing her emotional experiences, reflecting and reinforcing her efforts, problem solving stressors, identifying maladaptive obstacles and stratetgies for learning more about others and being emotionally vulnerable, and formulating plans. Courtney Trevino discussed how concerns about "security," "stability", and others attempting to manipulate her stem from childhood trauma and have led to her engaging in maladaptive behaviors (e.g., interrupting others and stating her concerns about them before having fully heard the message they were trying to convey) make it hard to develop the emotionally close relationships she desires. Through conversation, she concluded with plans to attempt to catch these urges by being mindful of warning signs (e.g., the topic of sex being discussed or them being emotionally vulnerable with her) and to acknowledge and apologize to the person if she does interrupt them as well as to asking herself if there is a possible non malicious intention behind various behaviors (e.g.,  having a hard time listening to her because of stress) while also being mindful of concerning behavioral patterns. She denied having any other issues or concerns.  Courtney Trevino did not endorse experiencing suicidal or homicidal ideation, plan, or intent since last meeting with this Probation officer and described future plans that include ongoing efforts to improve her wellbeing.  Courtney Trevino was receptive to today's appointment as evidenced by openness to sharing and responsiveness to feedback.  Interventions:  Provided empathic reflections and validation Reviewed content from the previous session Provided positive reinforcement Employed supportive psychotherapy interventions to facilitate reduced distress and to improve coping skills with identified stressors Engaged patient in goal setting Engaged patient in problem solving Psychoeducation provided regarding cognitive distortions  Explored patient's values  Reviewed learned skills  DSM-5 Diagnosis(es):  F43.10 Posttraumtic Stress Disorder  Plan: Courtney Trevino is currently scheduled for a follow-up appointment on 06/08/2022 at 8am via WebEx video visit with audio. The next session will focus on value congruent actions and emotional vulnerability.   Treatment Goal & Progress:  1. Related Problem: Eliminate or reduce the negative impact trauma related symptoms have on  social, occupational, and family functioning. Description: Routinely use discussed therapeutic skills during stressors, relational issues,  and/or trauma reminders. Target Date: 2023-01-16 Frequency: Weekly Modality: individual Progress: 90%  2. Related Problem: Eliminate or reduce the negative impact trauma related symptoms have on  social, occupational, and family functioning. Description: Participate in Acceptance and Commitment Therapy (ACT) to reduce the impact  of the traumatic event. Target Date: 2022-01-15 Frequency: Weekly Modality: individual Progress: 100% ? Planned  Intervention: Use an ACT approach to PTSD to help the client experience and  accept the presence of troubling thoughts and images without being overly impacted by  them, and committing his/her time and efforts to activities that  are consistent with  identified, personally meaningful values. 3. Related Problem: Eliminate or reduce the negative impact trauma related symptoms have on social, occupational, and family functioning. Description: Learn and implement personal skills to manage challenging situations related to trauma. Target Date: 2022-01-15 Frequency: Weekly Modality: individual Progress: 100% 4. Related Problem: Eliminate or reduce the negative impact trauma related symptoms have on  social, occupational, and family functioning. Description: Participate in Cognitive Processing Therapy to process the trauma and reduce its  impact. Target Date: 2022-01-15 Frequency: Weekly Modality: individual Progress: 100% 5. Related Problem: Eliminate or reduce the negative impact trauma related symptoms have on  social, occupational, and family functioning. Description: Verbalize an understanding of the treatment rationale for PTSD. Target Date: 2022-01-15 Frequency: Weekly Modality: individual Progress: 100% 6. Related Problem: Eliminate or reduce the negative impact trauma related symptoms have on  social, occupational, and family functioning. Description: Learn and implement calming skills. Target Date: 2023-01-16 Frequency: Weekly Modality: individual Progress: 90% ? Planned Intervention: Teach the client calming skills (e.g., breathing retraining, relaxation,calming self-talk) to use in and between sessions when feeling overly distressed.              Courtney Lory, PsyD

## 2022-06-08 ENCOUNTER — Ambulatory Visit (INDEPENDENT_AMBULATORY_CARE_PROVIDER_SITE_OTHER): Payer: 59 | Admitting: Psychology

## 2022-06-08 DIAGNOSIS — F4310 Post-traumatic stress disorder, unspecified: Secondary | ICD-10-CM

## 2022-06-08 NOTE — Progress Notes (Signed)
Date: June 08, 2022 Appointment Start Time: 8:04am Duration: 56 minutes Provider: Helmut Muster, PsyD Type of Session: Individual Therapy  Location of Patient: Home (private location) Location of Provider: Provider's Home (private office) Type of Contact: Caregility video visit with audio  Session Content: Today's appointment was a telepsychological visit due to COVID-19. Yudit is aware it is her responsibility to secure confidentiality on her end of the session. She provided verbal consent to proceed with today's appointment. Prior to proceeding with today's appointment, Ivory's physical location at the time of this appointment was obtained as well a phone number she could be reached at in the event of technical difficulties.   This provider conducted a check-in. Ms. Raether endorsed various employment stressors that occurred as well as a limits she plans to set with family. This Clinical research associate assisted her in processing her emotional experiences, reflecting and reinforcing her efforts, problem solving stressors, and formulating value congruent goals. Ms. Sze discussed how she has been continuing efforts to prioritize self-care despite various employment stressors, relational strains, and concerns about aspects of her medical care. Through conversation, she noted various reasons she has refrained from going to the NICU to visit her newborn grandson thus far and indicated her reasons for not having done so sooner are value congruent. She stated plans to support in various ways while also setting limits and to go to the hospital to visit her family as soon as today, adding how she plans to try to avoid being required to wait for an extended period of time in a waiting room before she is able to see her grandson. She also stated plans to pause before responding should an upsetting comment or behavior occur. She denied having any other issues or concerns.   Ms. Champ Mungo did not endorse experiencing suicidal  or homicidal ideation, plan, or intent since last meeting with this Clinical research associate and described future plans that include ongoing efforts to improve their wellbeing.   Alleta was receptive to today's appointment as evidenced by openness to sharing and responsiveness to feedback.  Interventions:  Provided empathic reflections and validation Reviewed content from the previous session Provided positive reinforcement Employed supportive psychotherapy interventions to facilitate reduced distress and to improve coping skills with identified stressors Engaged patient in goal setting Engaged patient in problem solving Psychoeducation provided regarding cognitive distortions  Explored patient's values  Reviewed learned skills  DSM-5 Diagnosis(es):  F43.10 Posttraumtic Stress Disorder  Plan: Daliah is currently scheduled for a follow-up appointment on 06/22/2022 at 8am via Caregility video visit with audio. The next session will focus on value congruent] actions.    Treatment Goal & Progress:  1. Related Problem: Eliminate or reduce the negative impact trauma related symptoms have on  social, occupational, and family functioning. Description: Routinely use discussed therapeutic skills during stressors, relational issues,  and/or trauma reminders. Target Date: 2023-01-16 Frequency: Weekly Modality: individual Progress: 90%  2. Related Problem: Eliminate or reduce the negative impact trauma related symptoms have on  social, occupational, and family functioning. Description: Participate in Acceptance and Commitment Therapy (ACT) to reduce the impact  of the traumatic event. Target Date: 2022-01-15 Frequency: Weekly Modality: individual Progress: 100% ? Planned Intervention: Use an ACT approach to PTSD to help the client experience and  accept the presence of troubling thoughts and images without being overly impacted by  them, and committing his/her time and efforts to activities that are  consistent with  identified, personally meaningful values. 3. Related Problem: Eliminate or reduce the negative impact  trauma related symptoms have on social, occupational, and family functioning. Description: Learn and implement personal skills to manage challenging situations related to trauma. Target Date: 2022-01-15 Frequency: Weekly Modality: individual Progress: 100% 4. Related Problem: Eliminate or reduce the negative impact trauma related symptoms have on  social, occupational, and family functioning. Description: Participate in Cognitive Processing Therapy to process the trauma and reduce its  impact. Target Date: 2022-01-15 Frequency: Weekly Modality: individual Progress: 100% 5. Related Problem: Eliminate or reduce the negative impact trauma related symptoms have on  social, occupational, and family functioning. Description: Verbalize an understanding of the treatment rationale for PTSD. Target Date: 2022-01-15 Frequency: Weekly Modality: individual Progress: 100% 6. Related Problem: Eliminate or reduce the negative impact trauma related symptoms have on  social, occupational, and family functioning. Description: Learn and implement calming skills. Target Date: 2023-01-16 Frequency: Weekly Modality: individual Progress: 90% ? Planned Intervention: Teach the client calming skills (e.g., breathing retraining, relaxation,calming self-talk) to use in and between sessions when feeling overly distressed.             [name], PsyD

## 2022-06-22 ENCOUNTER — Ambulatory Visit (INDEPENDENT_AMBULATORY_CARE_PROVIDER_SITE_OTHER): Payer: 59 | Admitting: Psychology

## 2022-06-22 DIAGNOSIS — F4310 Post-traumatic stress disorder, unspecified: Secondary | ICD-10-CM | POA: Diagnosis not present

## 2022-06-22 NOTE — Progress Notes (Signed)
Date: Jun 22, 2022 Appointment Start Time: 8:04am (patient was experiencing technical issues) Duration: 56 minutes Provider: Helmut Muster, PsyD Type of Session: Individual Therapy  Location of Patient: Home (private location) Location of Provider: Provider's Home (private office) Type of Contact: Caregility video visit with audio  Session Content: Today's appointment was a telepsychological visit due to COVID-19. Sonni is aware it is her responsibility to secure confidentiality on her end of the session. She provided verbal consent to proceed with today's appointment. Prior to proceeding with today's appointment, Kadence's physical location at the time of this appointment was obtained as well a phone number she could be reached at in the event of technical difficulties.   This provider conducted a check-in. Ms. Gulas endorsed anxiety and described relational strains.  This Clinical research associate assisted her in processing her emotional experiences, reflecting and reinforcing her efforts, problem solving stressors, providing psychoeducation on communication, and formulating value congruent goals. Ms. Spohr discussed examples of various relational stressors that have contributed to beliefs she is being "taken advantage of," that other's do not care or expect her to disregard past hurts and feelings of anger, and an urge to withdraw from others, adding a belief that that she may be "post-menopausal" and recent psychotropic medication changes may be playing a role as well.Through conversation, she described plans to balance accepting various situations and allowing the emotions to pass before reaching out, to reach out to her son, visit her aunt's for mother's day,  keeping things as is for now with an aide, and letting others know when she is feeling irritable and being open to their feedback if it is constructive or caring feedback. She denied having any other issues or conerns, although stated a desire to continue  discussing her "frame of mind" (e.g., urge to withdraw and "enjoying" spending time alone watching TV).   Ms. Moccia did not endorse experiencing suicidal or homicidal ideation, plan, or intent since last meeting with this Clinical research associate and described future plans that include ongoing efforts to improve their wellbeing.   Patience was receptive to today's appointment as evidenced by openness to sharing and responsiveness to feedback.  Interventions:  Provided empathic reflections and validation Reviewed content from the previous session Provided positive reinforcement Employed supportive psychotherapy interventions to facilitate reduced distress and to improve coping skills with identified stressors Engaged patient in goal setting Engaged patient in problem solving Explored patient's values  Reviewed learned skills  DSM-5 Diagnosis(es):  F43.10 Posttraumtic Stress Disorder  Plan: Mylia is currently scheduled for a follow-up appointment on 07/06/2022 at 8am via Caregility video visit with audio. The next session will focus on value congruent actions.    Treatment Goal & Progress:  1. Related Problem: Eliminate or reduce the negative impact trauma related symptoms have on  social, occupational, and family functioning. Description: Routinely use discussed therapeutic skills during stressors, relational issues,  and/or trauma reminders. Target Date: 2023-01-16 Frequency: Weekly Modality: individual Progress: 90%  2. Related Problem: Eliminate or reduce the negative impact trauma related symptoms have on  social, occupational, and family functioning. Description: Participate in Acceptance and Commitment Therapy (ACT) to reduce the impact  of the traumatic event. Target Date: 2022-01-15 Frequency: Weekly Modality: individual Progress: 100% ? Planned Intervention: Use an ACT approach to PTSD to help the client experience and  accept the presence of troubling thoughts and images without  being overly impacted by  them, and committing his/her time and efforts to activities that are consistent with  identified, personally meaningful values.  3. Related Problem: Eliminate or reduce the negative impact trauma related symptoms have on social, occupational, and family functioning. Description: Learn and implement personal skills to manage challenging situations related to trauma. Target Date: 2022-01-15 Frequency: Weekly Modality: individual Progress: 100% 4. Related Problem: Eliminate or reduce the negative impact trauma related symptoms have on  social, occupational, and family functioning. Description: Participate in Cognitive Processing Therapy to process the trauma and reduce its  impact. Target Date: 2022-01-15 Frequency: Weekly Modality: individual Progress: 100% 5. Related Problem: Eliminate or reduce the negative impact trauma related symptoms have on  social, occupational, and family functioning. Description: Verbalize an understanding of the treatment rationale for PTSD. Target Date: 2022-01-15 Frequency: Weekly Modality: individual Progress: 100% 6. Related Problem: Eliminate or reduce the negative impact trauma related symptoms have on  social, occupational, and family functioning. Description: Learn and implement calming skills. Target Date: 2023-01-16 Frequency: Weekly Modality: individual Progress: 90% ? Planned Intervention: Teach the client calming skills (e.g., breathing retraining, relaxation,calming self-talk) to use in and between sessions when feeling overly distressed.            [name], PsyD

## 2022-07-06 ENCOUNTER — Ambulatory Visit (INDEPENDENT_AMBULATORY_CARE_PROVIDER_SITE_OTHER): Payer: 59 | Admitting: Psychology

## 2022-07-06 DIAGNOSIS — F4310 Post-traumatic stress disorder, unspecified: Secondary | ICD-10-CM

## 2022-07-06 NOTE — Progress Notes (Addendum)
Date: Jul 06, 2022 Appointment Start Time: 8:03am Duration: 56 minutes Provider: Helmut Muster, PsyD Type of Session: Individual Therapy  Location of Patient: Home (private location) Location of Provider: Provider's Home (private office) Type of Contact: Microsoft Teams video visit with audio  Session Content: Today's appointment was a telepsychological visit due to COVID-19. Courtney Trevino is aware it is her responsibility to secure confidentiality on her end of the session. She provided verbal consent to proceed with today's appointment. Prior to proceeding with today's appointment, Courtney Trevino's physical location at the time of this appointment was obtained as well a phone number she could be reached at in the event of technical difficulties.   This provider conducted a check-in. Courtney Trevino endorsed grief secondary to her uncle's passing. This Clinical research associate assisted her in processing her emotional experiences, reflecting and reinforcing her efforts, problem solving stressors, identifying her values, and formulating value congruent goals.  Courtney Trevino discussed how she has been prioritizing self-care and allowing herself to grieve, support]ng her uncle's wife and daughter, acknowledging limits and stepping away as needed, spending time with supportive others, and setting boundaries with various family members. Courtney Trevino further discussed various social events she attended or plants to attend (e.g., going on a double date, going to [place] with a friend, and visiting family at the beach), noting how she has also been balancing spending time alone when beneficial to do so, having others over, and visiting others. She concluded with plans to continue her current efforts and to determine a best time to see her grandson. She denied having any other issues or concerns.   Courtney Trevino did not endorse experiencing suicidal or homicidal ideation, plan, or intent since last meeting with this Clinical research associate and described future plans  that include ongoing efforts to improve their wellbeing.   Courtney Trevino was receptive to today's appointment as evidenced by openness to sharing and responsiveness to feedback.  Interventions:  Provided empathic reflections and validation Reviewed content from the previous session Provided positive reinforcement Employed supportive psychotherapy interventions to facilitate reduced distress and to improve coping skills with identified stressors Engaged patient in problem solving Psychoeducation provided regarding self-care Explored patient's values  Reviewed learned skills  DSM-5 Diagnosis(es):  F43.10 Posttraumtic Stress Disorder  Plan: Courtney Trevino is currently scheduled for a follow-up appointment on 07/20/2022 at 8am via Microsoft Teams video visit with audio. The next session will focus on value congruent actiosn.    Treatment Goal & Progress:  1. Related Problem: Eliminate or reduce the negative impact trauma related symptoms have on  social, occupational, and family functioning. Description: Routinely use discussed therapeutic skills during stressors, relational issues,  and/or trauma reminders. Target Date: 2023-01-16 Frequency: Weekly Modality: individual Progress: 90%  2. Related Problem: Eliminate or reduce the negative impact trauma related symptoms have on  social, occupational, and family functioning. Description: Participate in Acceptance and Commitment Therapy (ACT) to reduce the impact  of the traumatic event. Target Date: 2022-01-15 Frequency: Weekly Modality: individual Progress: 100% ? Planned Intervention: Use an ACT approach to PTSD to help the client experience and  accept the presence of troubling thoughts and images without being overly impacted by  them, and committing his/her time and efforts to activities that are consistent with  identified, personally meaningful values. 3. Related Problem: Eliminate or reduce the negative impact trauma related symptoms have  on social, occupational, and family functioning. Description: Learn and implement personal skills to manage challenging situations related to trauma. Target Date: 2022-01-15 Frequency: Weekly Modality: individual Progress: 100%  4. Related Problem: Eliminate or reduce the negative impact trauma related symptoms have on  social, occupational, and family functioning. Description: Participate in Cognitive Processing Therapy to process the trauma and reduce its  impact. Target Date: 2022-01-15 Frequency: Weekly Modality: individual Progress: 100% 5. Related Problem: Eliminate or reduce the negative impact trauma related symptoms have on  social, occupational, and family functioning. Description: Verbalize an understanding of the treatment rationale for PTSD. Target Date: 2022-01-15 Frequency: Weekly Modality: individual Progress: 100% 6. Related Problem: Eliminate or reduce the negative impact trauma related symptoms have on  social, occupational, and family functioning. Description: Learn and implement calming skills. Target Date: 2023-01-16 Frequency: Weekly Modality: individual Progress: 90% ? Planned Intervention: Teach the client calming skills (e.g., breathing retraining, relaxation,calming self-talk) to use in and between sessions when feeling overly distressed.              [name], PsyD

## 2022-07-19 NOTE — Progress Notes (Unsigned)
Date: 07/20/2022 Appointment Start Time: 8:04am Duration: 56 minutes Provider: Helmut Muster, PsyD Type of Session: Individual Therapy  Location of Patient: Home (private location) Location of Provider: Provider's Home (private office) Type of Contact: Microsoft Teams video visit with audio  Session Content: Today's appointment was a telepsychological visit due to COVID-19. Courtney Trevino is aware it is her responsibility to secure confidentiality on her end of the session. She provided verbal consent to proceed with today's appointment. Prior to proceeding with today's appointment, Courtney Trevino's physical location at the time of this appointment was obtained as well a phone number she could be reached at in the event of technical difficulties.   This provider conducted a check-in. Courtney Trevino discussed various efforts she is making, how past trauma impacted her, and ambivalence on how to proceed with situations. This Clinical research associate assisted her in processing her emotional experiences, reflecting and reinforcing her efforts, problem solving stressors, identifying areas of personal growth, and formulating value congruent plans.  Courtney Trevino discussed how she is continuing to check-in with her aunt while also allowing her space to grieve, how past trauma did not "allow [her] to have a childhood" and reinforced beliefs and behaviors that contributed to past isolation, and ambivalence about how to address a relational pattern issue with one of her daughters. Through conversation, she shared how she has been increasingly setting limits and boundaries with others, balancing self-care and spending time with supportive others, and engaging in self-care, which has reduced trauma-related beliefs and behaviors. She concluded with plans to continue these efforts and to remind herself that he daughter's behaviors are not personal but related to a difference in how their mind's handle planning. She denied having any other issues or  concerns.   Courtney Trevino did not endorse experiencing suicidal or homicidal ideation, plan, or intent since last meeting with this Clinical research associate and described future plans that include ongoing efforts to improve their wellbeing.   Courtney Trevino was receptive to today's appointment as evidenced by openness to sharing and responsiveness to feedback.  Interventions:  Provided empathic reflections and validation Reviewed content from the previous session Provided positive reinforcement Employed supportive psychotherapy interventions to facilitate reduced distress and to improve coping skills with identified stressors Engaged patient in goal setting Engaged patient in problem solving Psychoeducation provided regarding cognitive distortions  Explored patient's values  Reviewed learned skills  DSM-5 Diagnosis(es):  F43.10 Posttraumtic Stress Disorder  Plan: Courtney Trevino is currently scheduled for a follow-up appointment on 08/03/2022 at 8am via Microsoft Teams video visit with audio. The next session will focus on value congruent actions.    Treatment Goal & Progress:  1. Related Problem: Eliminate or reduce the negative impact trauma related symptoms have on  social, occupational, and family functioning. Description: Routinely use discussed therapeutic skills during stressors, relational issues,  and/or trauma reminders. Target Date: 2023-01-16 Frequency: Weekly Modality: individual Progress: 90%  2. Related Problem: Eliminate or reduce the negative impact trauma related symptoms have on  social, occupational, and family functioning. Description: Participate in Acceptance and Commitment Therapy (ACT) to reduce the impact  of the traumatic event. Target Date: 2022-01-15 Frequency: Weekly Modality: individual Progress: 100% ? Planned Intervention: Use an ACT approach to PTSD to help the client experience and  accept the presence of troubling thoughts and images without being overly impacted by  them,  and committing his/her time and efforts to activities that are consistent with  identified, personally meaningful values. 3. Related Problem: Eliminate or reduce the negative impact trauma related symptoms have  on social, occupational, and family functioning. Description: Learn and implement personal skills to manage challenging situations related to trauma. Target Date: 2022-01-15 Frequency: Weekly Modality: individual Progress: 100% 4. Related Problem: Eliminate or reduce the negative impact trauma related symptoms have on  social, occupational, and family functioning. Description: Participate in Cognitive Processing Therapy to process the trauma and reduce its  impact. Target Date: 2022-01-15 Frequency: Weekly Modality: individual Progress: 100% 5. Related Problem: Eliminate or reduce the negative impact trauma related symptoms have on  social, occupational, and family functioning. Description: Verbalize an understanding of the treatment rationale for PTSD. Target Date: 2022-01-15 Frequency: Weekly Modality: individual Progress: 100% 6. Related Problem: Eliminate or reduce the negative impact trauma related symptoms have on  social, occupational, and family functioning. Description: Learn and implement calming skills. Target Date: 2023-01-16 Frequency: Weekly Modality: individual Progress: 90% ? Planned Intervention: Teach the client calming skills (e.g., breathing retraining, relaxation,calming self-talk) to use in and between sessions when feeling overly distressed.               [name], PsyD

## 2022-07-20 ENCOUNTER — Ambulatory Visit (INDEPENDENT_AMBULATORY_CARE_PROVIDER_SITE_OTHER): Payer: 59 | Admitting: Psychology

## 2022-07-20 DIAGNOSIS — F4310 Post-traumatic stress disorder, unspecified: Secondary | ICD-10-CM

## 2022-08-03 ENCOUNTER — Ambulatory Visit (INDEPENDENT_AMBULATORY_CARE_PROVIDER_SITE_OTHER): Payer: 59 | Admitting: Psychology

## 2022-08-03 DIAGNOSIS — F4310 Post-traumatic stress disorder, unspecified: Secondary | ICD-10-CM | POA: Diagnosis not present

## 2022-08-03 NOTE — Progress Notes (Signed)
Date: 07/16/2022 Appointment Start Time: 8:05am Duration: 56 minutes Provider: Helmut Muster, PsyD Type of Session: Individual Therapy  Location of Patient: Home (private location) Location of Provider: Provider's Home (private office) Type of Contact: Caregility video visit with audio  Session Content: Today's appointment was a telepsychological visit due to COVID-19. Ieva is aware it is her responsibility to secure confidentiality on her end of the session. She provided verbal consent to proceed with today's appointment. Prior to proceeding with today's appointment, Nevaeha's physical location at the time of this appointment was obtained as well a phone number she could be reached at in the event of technical difficulties. Rena is aware of the limitations of teleheath visits and verbally consented to proceed.  This provider conducted a check-in. Ms. Sniffen endorsed low mood and anxiety. This Clinical research associate assisted her in processing her emotional experiences, reflecting and reinforcing her efforts, prioritizing and problem solving stressors, and formulating value congruent plans. Ms. Mew discussed how various stressors involving family members have caused anxiety, noting ambivalence on how involved to be and how to address them. Through conversation, she noted examples of various longstanding behavioral patterns between her and the family members, noting how becoming involved or providing advice tended not to work. She concluded with plans to remind herself that much of the situations is outside her control, to prioritize assisting her daughter during and after a surgery, to keep set boundaries with her other daughter and efforts to speak with her daughter about obstacles she is experiencing for desired changes, and to not further address the situation with her son unless it becomes necessary to do so. She further concluded with her plans to continue exercising, reminding herself of the things that  are going well, setting limits with others, keeping in contact with supportive others, and use of spiritual messages. She denied having any other issues or concerns.   Ms. Fleischer did not endorse experiencing suicidal or homicidal ideation, plan, or intent since last meeting with this Clinical research associate and described future plans that include ongoing efforts to improve her wellbeing.   Laguisha was receptive to today's appointment as evidenced by openness to sharing and responsiveness to feedback.  Interventions:  Provided empathic reflections and validation Reviewed content from the previous session Provided positive reinforcement Employed supportive psychotherapy interventions to facilitate reduced distress and to improve coping skills with identified stressors Engaged patient in goal setting Engaged patient in problem solving Psychoeducation provided regarding cognitive distortions  Explored patient's values  Reviewed learned skills  DSM-5 Diagnosis(es):  F43.10 Posttraumtic Stress Disorder  Plan: Adelisa is currently scheduled for a follow-up appointment on 08/10/2022 at 11am via Caregility video visit with audio. The next session will focus on value congruent goals.    Treatment Goal & Progress:  1. Related Problem: Eliminate or reduce the negative impact trauma related symptoms have on  social, occupational, and family functioning. Description: Routinely use discussed therapeutic skills during stressors, relational issues,  and/or trauma reminders. Target Date: 2023-01-16 Frequency: Weekly Modality: individual Progress: 90%  2. Related Problem: Eliminate or reduce the negative impact trauma related symptoms have on  social, occupational, and family functioning. Description: Participate in Acceptance and Commitment Therapy (ACT) to reduce the impact  of the traumatic event. Target Date: 2022-01-15 Frequency: Weekly Modality: individual Progress: 100% ? Planned Intervention: Use an ACT  approach to PTSD to help the client experience and  accept the presence of troubling thoughts and images without being overly impacted by  them, and committing his/her time and efforts  to activities that are consistent with  identified, personally meaningful values. 3. Related Problem: Eliminate or reduce the negative impact trauma related symptoms have on social, occupational, and family functioning. Description: Learn and implement personal skills to manage challenging situations related to trauma. Target Date: 2022-01-15 Frequency: Weekly Modality: individual Progress: 100% 4. Related Problem: Eliminate or reduce the negative impact trauma related symptoms have on  social, occupational, and family functioning. Description: Participate in Cognitive Processing Therapy to process the trauma and reduce its  impact. Target Date: 2022-01-15 Frequency: Weekly Modality: individual Progress: 100% 5. Related Problem: Eliminate or reduce the negative impact trauma related symptoms have on  social, occupational, and family functioning. Description: Verbalize an understanding of the treatment rationale for PTSD. Target Date: 2022-01-15 Frequency: Weekly Modality: individual Progress: 100% 6. Related Problem: Eliminate or reduce the negative impact trauma related symptoms have on  social, occupational, and family functioning. Description: Learn and implement calming skills. Target Date: 2023-01-16 Frequency: Weekly Modality: individual Progress: 90% ? Planned Intervention: Teach the client calming skills (e.g., breathing retraining, relaxation,calming self-talk) to use in and between sessions when feeling overly distressed.               [name], PsyD

## 2022-08-10 ENCOUNTER — Ambulatory Visit (INDEPENDENT_AMBULATORY_CARE_PROVIDER_SITE_OTHER): Payer: 59 | Admitting: Psychology

## 2022-08-10 DIAGNOSIS — F4310 Post-traumatic stress disorder, unspecified: Secondary | ICD-10-CM | POA: Diagnosis not present

## 2022-08-10 NOTE — Progress Notes (Signed)
Date: 08/10/2022 Appointment Start Time: 11am Duration: 60 minutes Provider: Helmut Muster, PsyD Type of Session: Individual Therapy  Location of Patient: Home (private location) Location of Provider: Provider's Home (private office) Type of Contact: Caregility video visit with audio  Session Content: Today's appointment was a telepsychological visit due to COVID-19. Kriss is aware it is her responsibility to secure confidentiality on her end of the session. She provided verbal consent to proceed with today's appointment. Prior to proceeding with today's appointment, Rigby's physical location at the time of this appointment was obtained as well a phone number she could be reached at in the event of technical difficulties. Layce is aware of the limitations of teleheath visits and verbally consented to proceed.  This provider conducted a check-in. Ms. Wailes endorsed ongoing concerns about her medical status and relational patterns with various family members. This Clinical research associate assisted her in processing her emotional experiences, reflecting and reinforcing her efforts, problem solving stressors, identifying her values, and formulating value congruent plans.  Ms. Parcher discussed examples of various maladaptive behavioral patterns that have occurred in the past and currently with various others, ongoing concerns and management of her medical status, and ambivalence on how to proceed with various stressors. Through conversation, she identified various warning signs that she is entering into maladaptive patterns with others (e.g., responding to an intentionally upsetting comments, engaging with her children in a way in which she is "raising them" versus acknowledging they are adults making their own choices, and not considering her own wellbeing) and for her physical wellbeing. She concluded with plans to prioritize helping one of her daughter's after a surgery as well as her own wellbeing, defusing from  issues occurring with her other daughter, and waiting for an invite from her son to see her grandson. She indicated the following plan was manageable. She denied having any other issues or concerns.   Ms. Holte did not endorse experiencing suicidal or homicidal ideation, plan, or intent since last meeting with this Clinical research associate and described future plans that include ongoing efforts to improve their wellbeing.   Carren was receptive to today's appointment as evidenced by openness to sharing and responsiveness to feedback.  Interventions:  Provided empathic reflections and validation Reviewed content from the previous session Provided positive reinforcement Employed supportive psychotherapy interventions to facilitate reduced distress and to improve coping skills with identified stressors Engaged patient in goal setting Engaged patient in problem solving Psychoeducation provided regarding cognitive distortions  Psychoeducation provided regarding self-care Explored patient's values  Reviewed learned skills  DSM-5 Diagnosis(es):  F43.10 Posttraumtic Stress Disorder  Plan: Keyarra is currently scheduled for a follow-up appointment on 08/17/2022 at 8am via Caregility video visit with audio. The next session will focus on value congruent actions.    Treatment Goal & Progress:  1. Related Problem: Eliminate or reduce the negative impact trauma related symptoms have on  social, occupational, and family functioning. Description: Routinely use discussed therapeutic skills during stressors, relational issues,  and/or trauma reminders. Target Date: 2023-01-16 Frequency: Weekly Modality: individual Progress: 90%  2. Related Problem: Eliminate or reduce the negative impact trauma related symptoms have on  social, occupational, and family functioning. Description: Participate in Acceptance and Commitment Therapy (ACT) to reduce the impact  of the traumatic event. Target Date: 2022-01-15 Frequency:  Weekly Modality: individual Progress: 100% ? Planned Intervention: Use an ACT approach to PTSD to help the client experience and  accept the presence of troubling thoughts and images without being overly impacted by  them, and  committing his/her time and efforts to activities that are consistent with  identified, personally meaningful values. 3. Related Problem: Eliminate or reduce the negative impact trauma related symptoms have on social, occupational, and family functioning. Description: Learn and implement personal skills to manage challenging situations related to trauma. Target Date: 2022-01-15 Frequency: Weekly Modality: individual Progress: 100% 4. Related Problem: Eliminate or reduce the negative impact trauma related symptoms have on  social, occupational, and family functioning. Description: Participate in Cognitive Processing Therapy to process the trauma and reduce its  impact. Target Date: 2022-01-15 Frequency: Weekly Modality: individual Progress: 100% 5. Related Problem: Eliminate or reduce the negative impact trauma related symptoms have on  social, occupational, and family functioning. Description: Verbalize an understanding of the treatment rationale for PTSD. Target Date: 2022-01-15 Frequency: Weekly Modality: individual Progress: 100% 6. Related Problem: Eliminate or reduce the negative impact trauma related symptoms have on  social, occupational, and family functioning. Description: Learn and implement calming skills. Target Date: 2023-01-16 Frequency: Weekly Modality: individual Progress: 90% ? Planned Intervention: Teach the client calming skills (e.g., breathing retraining, relaxation,calming self-talk) to use in and between sessions when feeling overly distressed.              [name], PsyD

## 2022-08-17 ENCOUNTER — Ambulatory Visit: Payer: 59 | Admitting: Psychology

## 2022-08-17 DIAGNOSIS — F4310 Post-traumatic stress disorder, unspecified: Secondary | ICD-10-CM

## 2022-08-17 NOTE — Progress Notes (Signed)
Date: 08/17/2022 Appointment Start Time: 8:03am (patient arrived late to the appointment) Duration: 56 minutes Provider: Helmut Muster, PsyD Type of Session: Individual Therapy  Location of Patient: Home (private location) Location of Provider: Provider's Home (private office) Type of Contact: Caregility video visit with audio  Session Content: Today's appointment was a telepsychological visit due to COVID-19. Amarah is aware it is her responsibility to secure confidentiality on her end of the session. She provided verbal consent to proceed with today's appointment. Prior to proceeding with today's appointment, Chancie's physical location at the time of this appointment was obtained as well a phone number she could be reached at in the event of technical difficulties. Itzamar is aware of the limitations of teleheath visits and verbally consented to proceed.  This provider conducted a check-in. Ms. Lhotka endorsed experiencing various relational strains with others. This Clinical research associate assisted her in processing her emotional experiences, reflecting and reinforcing her efforts and identifying areas of personal growth, problem solving stressors, and formulating value congruent goals.  Ms. Everest discussed how concerns about her own and other's wellbeing as well as generalizing her various skills to other areas in her life have led to her continuing to set limits, defuse from maladaptive relational patterns, increased recognition of areas outside her control, acknowledgment of her emotional experiences, and balancing of support and "not overstepping." She expressed ambivalence on how to proceed with a friend's medical status. Through conversation, she concluded with plans to continue visiting and checking on her, assessing if she appears willing to hear her concerns, and notifying the friend's son about her medical status should a perceived need arise, adding how some actions may cause relational strains in the  long-term but that her priority is the friend's wellbeing, the friend is likely to understand her intentions in the long-term, and that the actions are value congruent. She denied having any other issues or concerns.   Ms. Greg did not endorse experiencing suicidal or homicidal ideation, plan, or intent since last meeting with this Clinical research associate and described future plans that include ongoing efforts to improve their wellbeing.   Luciel was receptive to today's appointment as evidenced by openness to sharing and responsiveness to feedback.  Interventions:  Provided empathic reflections and validation Reviewed content from the previous session Provided positive reinforcement Employed supportive psychotherapy interventions to facilitate reduced distress and to improve coping skills with identified stressors Engaged patient in goal setting Engaged patient in problem solving Explored patient's values  Reviewed learned skills  DSM-5 Diagnosis(es):  F43.10 Posttraumtic Stress Disorder  Plan: Maiysha is currently scheduled for a follow-up appointment on 08/24/2022 at 4pm via Caregility video visit with audio. The next session will focus on value congruent actiosn.   Treatment Goal & Progress:  1. Related Problem: Eliminate or reduce the negative impact trauma related symptoms have on  social, occupational, and family functioning. Description: Routinely use discussed therapeutic skills during stressors, relational issues,  and/or trauma reminders. Target Date: 2023-01-16 Frequency: Weekly Modality: individual Progress: 90%  2. Related Problem: Eliminate or reduce the negative impact trauma related symptoms have on  social, occupational, and family functioning. Description: Participate in Acceptance and Commitment Therapy (ACT) to reduce the impact  of the traumatic event. Target Date: 2022-01-15 Frequency: Weekly Modality: individual Progress: 100% ? Planned Intervention: Use an ACT  approach to PTSD to help the client experience and  accept the presence of troubling thoughts and images without being overly impacted by  them, and committing his/her time and efforts to activities that  are consistent with  identified, personally meaningful values. 3. Related Problem: Eliminate or reduce the negative impact trauma related symptoms have on social, occupational, and family functioning. Description: Learn and implement personal skills to manage challenging situations related to trauma. Target Date: 2022-01-15 Frequency: Weekly Modality: individual Progress: 100% 4. Related Problem: Eliminate or reduce the negative impact trauma related symptoms have on  social, occupational, and family functioning. Description: Participate in Cognitive Processing Therapy to process the trauma and reduce its  impact. Target Date: 2022-01-15 Frequency: Weekly Modality: individual Progress: 100% 5. Related Problem: Eliminate or reduce the negative impact trauma related symptoms have on  social, occupational, and family functioning. Description: Verbalize an understanding of the treatment rationale for PTSD. Target Date: 2022-01-15 Frequency: Weekly Modality: individual Progress: 100% 6. Related Problem: Eliminate or reduce the negative impact trauma related symptoms have on  social, occupational, and family functioning. Description: Learn and implement calming skills. Target Date: 2023-01-16 Frequency: Weekly Modality: individual Progress: 90% ? Planned Intervention: Teach the client calming skills (e.g., breathing retraining, relaxation,calming self-talk) to use in and between sessions when feeling overly distressed.                 [name], PsyD

## 2022-08-24 ENCOUNTER — Ambulatory Visit: Payer: 59 | Admitting: Psychology

## 2022-08-24 DIAGNOSIS — F4310 Post-traumatic stress disorder, unspecified: Secondary | ICD-10-CM

## 2022-08-24 NOTE — Progress Notes (Signed)
This writer contacted the patient via telephone to check-in as they did not present for the appointment. The patient was unable to be reached. A HIPAA-compliant voicemail was left noting this writer would stay on the appointment until 15 minutes after the scheduled appointment time, but after this time, the appointment would need to be rescheduled.                Elexis Pollak T Tip Atienza, PsyD 

## 2022-08-31 ENCOUNTER — Ambulatory Visit: Payer: 59 | Admitting: Psychology

## 2022-08-31 DIAGNOSIS — F4310 Post-traumatic stress disorder, unspecified: Secondary | ICD-10-CM | POA: Diagnosis not present

## 2022-08-31 NOTE — Progress Notes (Signed)
Date: 08/31/2022 Appointment Start Time: 8:04am (patient arrived late and experienced technical issues) Duration: 57 minutes Provider: Helmut Muster, PsyD Type of Session: Individual Therapy  Location of Patient: Home (private location) Location of Provider: Provider's Home (private office) Type of Contact: Microsoft Teams video visit with audio and Caregility video visit with audio (appointment was switched to Teams with video and audio as a result of the patient experiencing technical issues)  Session Content: Today's appointment was a telepsychological visit due to COVID-19. Jolyn is aware it is her responsibility to secure confidentiality on her end of the session. She provided verbal consent to proceed with today's appointment. Prior to proceeding with today's appointment, Ashelynn's physical location at the time of this appointment was obtained as well a phone number she could be reached at in the event of technical difficulties. Mikal is aware of the limitations of teleheath visits and verbally consented to proceed.  This provider conducted a check-in. Ms. Vanostrand endorsed feeling overwhelmed. This Clinical research associate assisted her in processing her emotional experiences, reflecting and reinforcing her efforts, problem solving stressors, providing psychoeducation on thought defusion and acceptance, and formulating value congruent plans. Ms. Sarria discussed examples of various relational strains and medical issues that are occurring, noting how she is feeling "overwhelmed" and is ambivalent on how to proceed. Through conversation, she shared examples of various relational patterns that have occurred and how other's behaviors and perceptions are outside her control. She concluded with plans to continue reiterating how she can and cannot assist a friend; expressing to a friend why the situation is upsetting to her; to prioritize self-care and addressing of ongoing medical issues; and notifying her daughter why  she is upset, what she desires in the future, and to use phone calls to assist in identifying if her concerns are valid; and to utilize a thought defusion and acceptance-based strategy to assist in sitting with the discomfort of some of her lingering questions currently having no clear answers.  Ms. Luthi did not endorse experiencing suicidal or homicidal ideation, plan, or intent since last meeting with this Clinical research associate and described future plans that include ongoing efforts to improve their wellbeing.   Betzaira was receptive to today's appointment as evidenced by openness to sharing and responsiveness to feedback.  Interventions:  Provided empathic reflections and validation Reviewed content from the previous session Provided positive reinforcement Employed supportive psychotherapy interventions to facilitate reduced distress and to improve coping skills with identified stressors Engaged patient in goal setting Engaged patient in problem solving Psychoeducation provided regarding cognitive distortions  Explored patient's values  Reviewed learned skills  DSM-5 Diagnosis(es):  F43.10 Posttraumtic Stress Disorder  Plan: Abbigal is currently scheduled for a follow-up appointment on 09/14/2022 at 8am via Microsoft Teams video visit with audio. The next session will focus on value congruent actions.   Treatment Goal & Progress:  1. Related Problem: Eliminate or reduce the negative impact trauma related symptoms have on  social, occupational, and family functioning. Description: Routinely use discussed therapeutic skills during stressors, relational issues,  and/or trauma reminders. Target Date: 2023-01-16 Frequency: Weekly Modality: individual Progress: 90%  2. Related Problem: Eliminate or reduce the negative impact trauma related symptoms have on  social, occupational, and family functioning. Description: Participate in Acceptance and Commitment Therapy (ACT) to reduce the impact  of the  traumatic event. Target Date: 2022-01-15 Frequency: Weekly Modality: individual Progress: 100% ? Planned Intervention: Use an ACT approach to PTSD to help the client experience and  accept the presence of troubling thoughts  and images without being overly impacted by  them, and committing his/her time and efforts to activities that are consistent with  identified, personally meaningful values. 3. Related Problem: Eliminate or reduce the negative impact trauma related symptoms have on social, occupational, and family functioning. Description: Learn and implement personal skills to manage challenging situations related to trauma. Target Date: 2022-01-15 Frequency: Weekly Modality: individual Progress: 100% 4. Related Problem: Eliminate or reduce the negative impact trauma related symptoms have on  social, occupational, and family functioning. Description: Participate in Cognitive Processing Therapy to process the trauma and reduce its  impact. Target Date: 2022-01-15 Frequency: Weekly Modality: individual Progress: 100% 5. Related Problem: Eliminate or reduce the negative impact trauma related symptoms have on  social, occupational, and family functioning. Description: Verbalize an understanding of the treatment rationale for PTSD. Target Date: 2022-01-15 Frequency: Weekly Modality: individual Progress: 100% 6. Related Problem: Eliminate or reduce the negative impact trauma related symptoms have on  social, occupational, and family functioning. Description: Learn and implement calming skills. Target Date: 2023-01-16 Frequency: Weekly Modality: individual Progress: 90% ? Planned Intervention: Teach the client calming skills (e.g., breathing retraining, relaxation,calming self-talk) to use in and between sessions when feeling overly distressed.              [name], PsyD

## 2022-09-07 NOTE — Progress Notes (Signed)
Date: 09/14/2022 Appointment Start Time: 8:10am (patient arrived late to the appointment) Duration: 52 minutes Provider: Helmut Muster, PsyD Type of Session: Individual Therapy  Location of Patient: Home (private location) Location of Provider: Provider's Home (private office) Type of Contact: Microsoft Teams video visit with audio  Session Content: Today's appointment was a telepsychological visit due to COVID-19. Courtney Trevino is aware it is her responsibility to secure confidentiality on her end of the session. She provided verbal consent to proceed with today's appointment. Prior to proceeding with today's appointment, Courtney Trevino's physical location at the time of this appointment was obtained as well a phone number she could be reached at in the event of technical difficulties. Courtney Trevino is aware of the limitations of teleheath visits and verbally consented to proceed.  This provider conducted a check-in. Courtney Trevino endorsed emotional distress as a result of a trauma that resurfaced.  This Clinical research associate assisted Courtney Trevino in processing their emotional experiences, reflecting and reinforcing their efforts, problem solving stressors, provided psychoeducation on trauma and repressed memories, and formulating value congruent goals. Courtney Trevino discussed how her son notified her of trauma he experienced that she had since repressed, noting how this caused significant emotional upset toward her brother. She further discussed past examples in which she was reactive and how it led to negative repercussions, noting how she is utilizing that awareness to be mindful of her upset and reminding herself that reacting on it would be detrimental to her and her son's wellbeing. She stated this has also led to her blocking communication with her brother and processing her emotional experiences with her godmother. She concluded with plans to continue supporting her son as well as engaging in self-care and value congruent actions (e.g.,  volunteering with an organization). She also concluded with notifying this writer she has been dating a romantic interest and that it is going well. She denied having any other issues or concerns.   Courtney Trevino did not endorse experiencing suicidal or homicidal ideation, plan, or intent since last meeting with this Clinical research associate and described future plans that include ongoing efforts to improve her wellbeing.   Courtney Trevino was receptive to today's appointment as evidenced by openness to sharing and responsiveness to feedback.  Interventions:  Provided empathic reflections and validation Reviewed content from the previous session Provided positive reinforcement Employed supportive psychotherapy interventions to facilitate reduced distress and to improve coping skills with identified stressors Engaged patient in goal setting Engaged patient in problem solving Psychoeducation provided regarding cognitive distortions  Explored patient's values  Reviewed learned skills  DSM-5 Diagnosis(es):  F43.10 Posttraumtic Stress Disorder  Plan: Courtney Trevino is currently scheduled for a follow-up appointment on 09/28/2022 at 8am via Microsoft Teams video visit with audio. The next session will focus on value congruent actions.   Treatment Goal & Progress:  1. Related Problem: Eliminate or reduce the negative impact trauma related symptoms have on  social, occupational, and family functioning. Description: Routinely use discussed therapeutic skills during stressors, relational issues,  and/or trauma reminders. Target Date: 2023-01-16 Frequency: Weekly Modality: individual Progress: 90%  2. Related Problem: Eliminate or reduce the negative impact trauma related symptoms have on  social, occupational, and family functioning. Description: Participate in Acceptance and Commitment Therapy (ACT) to reduce the impact  of the traumatic event. Target Date: 2022-01-15 Frequency: Weekly Modality: individual Progress:  100% ? Planned Intervention: Use an ACT approach to PTSD to help the client experience and  accept the presence of troubling thoughts and images without being overly impacted by  them, and committing his/her time and efforts to activities that are consistent with  identified, personally meaningful values. 3. Related Problem: Eliminate or reduce the negative impact trauma related symptoms have on social, occupational, and family functioning. Description: Learn and implement personal skills to manage challenging situations related to trauma. Target Date: 2022-01-15 Frequency: Weekly Modality: individual Progress: 100% 4. Related Problem: Eliminate or reduce the negative impact trauma related symptoms have on  social, occupational, and family functioning. Description: Participate in Cognitive Processing Therapy to process the trauma and reduce its  impact. Target Date: 2022-01-15 Frequency: Weekly Modality: individual Progress: 100% 5. Related Problem: Eliminate or reduce the negative impact trauma related symptoms have on  social, occupational, and family functioning. Description: Verbalize an understanding of the treatment rationale for PTSD. Target Date: 2022-01-15 Frequency: Weekly Modality: individual Progress: 100% 6. Related Problem: Eliminate or reduce the negative impact trauma related symptoms have on  social, occupational, and family functioning. Description: Learn and implement calming skills. Target Date: 2023-01-16 Frequency: Weekly Modality: individual Progress: 90% ? Planned Intervention: Teach the client calming skills (e.g., breathing retraining, relaxation,calming self-talk) to use in and between sessions when feeling overly distressed.               [name], PsyD

## 2022-09-14 ENCOUNTER — Ambulatory Visit (INDEPENDENT_AMBULATORY_CARE_PROVIDER_SITE_OTHER): Payer: 59 | Admitting: Psychology

## 2022-09-14 DIAGNOSIS — F4310 Post-traumatic stress disorder, unspecified: Secondary | ICD-10-CM

## 2022-09-28 ENCOUNTER — Ambulatory Visit: Payer: 59 | Admitting: Psychology

## 2022-09-28 DIAGNOSIS — F4310 Post-traumatic stress disorder, unspecified: Secondary | ICD-10-CM | POA: Diagnosis not present

## 2022-09-28 NOTE — Progress Notes (Signed)
Date: 09/28/2022 Appointment Start Time: 8:04am Duration: 54 minutes Provider: Helmut Muster, PsyD Type of Session: Individual Therapy  Location of Patient: Home (private location) Location of Provider: Provider's Home (private office) Type of Contact: Caregility video visit with audio  Session Content: Today's appointment was a telepsychological visit due to COVID-19. Courtney Trevino is aware it is her responsibility to secure confidentiality on her end of the session. She provided verbal consent to proceed with today's appointment. Prior to proceeding with today's appointment, Courtney Trevino's physical location at the time of this appointment was obtained as well a phone number she could be reached at in the event of technical difficulties. Courtney Trevino is aware of the limitations of teleheath visits and verbally consented to proceed.  This provider conducted a check-in. Courtney Trevino discussed experiencing a low mood secondary to medical and housing issues. This Clinical research associate assisted Courtney Trevino in processing her emotional experiences, reflecting and reinforcing her efforts, problem solving stressors, and formulating value congruent plans. Courtney Trevino discussed how an updated on her medical status has led to a changed perception of past physical symptoms and noted uncertainity on how to manage and treat the conditions as well as how she is having her apartment fixed from water and mold issues but is looking into alternative living situations. She shared that she has been coping through eating nutrient dense foods as she is able to eat, taking stressors "a day at a time," keepign in contact with supportive others, and asking others for assistance. She further shared how various family members were unable to help, but that her friends readily came to her assistance which she appreciated and that she had previously adjusted her "expectations" of various others and reminded herself that their behaviors are not personal which reduced  upset. She concluded with how her efforts have reduced psychological suffering and better allowed her to address priorities for her wellbeing. She stated plans to speak with her medical care providers to obtain additional answers about her medial status   Courtney Trevino did not endorse experiencing suicidal or homicidal ideation, plan, or intent since last meeting with this Clinical research associate and described future plans that include ongoing efforts to improve their wellbeing.   Courtney Trevino was receptive to today's appointment as evidenced by openness to sharing and responsiveness to feedback.  Interventions:  Provided empathic reflections and validation Reviewed content from the previous session Provided positive reinforcement Employed supportive psychotherapy interventions to facilitate reduced distress and to improve coping skills with identified stressors Engaged patient in goal setting Engaged patient in problem solving Psychoeducation provided regarding cognitive distortions  Psychoeducation provided regarding all-or-nothing thinking Psychoeducation provided regarding the connection between thoughts, feelings, and behaviors Psychoeducation provided regarding self-care Explored patient's values  Reviewed learned skills  DSM-5 Diagnosis(es):  F43.10 Posttraumtic Stress Disorder  Plan: Courtney Trevino is currently scheduled for a follow-up appointment on 10/12/2022 at 8am via Caregility video visit with audio. The next session will focus on value congruent actions.    Treatment Goal & Progress:  1. Related Problem: Eliminate or reduce the negative impact trauma related symptoms have on  social, occupational, and family functioning. Description: Routinely use discussed therapeutic skills during stressors, relational issues,  and/or trauma reminders. Target Date: 2023-01-16 Frequency: Weekly Modality: individual Progress: 90%  2. Related Problem: Eliminate or reduce the negative impact trauma related symptoms  have on  social, occupational, and family functioning. Description: Participate in Acceptance and Commitment Therapy (ACT) to reduce the impact  of the traumatic event. Target Date: 2022-01-15 Frequency: Weekly Modality: individual Progress:  100% ? Planned Intervention: Use an ACT approach to PTSD to help the client experience and  accept the presence of troubling thoughts and images without being overly impacted by  them, and committing his/her time and efforts to activities that are consistent with  identified, personally meaningful values. 3. Related Problem: Eliminate or reduce the negative impact trauma related symptoms have on social, occupational, and family functioning. Description: Learn and implement personal skills to manage challenging situations related to trauma. Target Date: 2022-01-15 Frequency: Weekly Modality: individual Progress: 100% 4. Related Problem: Eliminate or reduce the negative impact trauma related symptoms have on  social, occupational, and family functioning. Description: Participate in Cognitive Processing Therapy to process the trauma and reduce its  impact. Target Date: 2022-01-15 Frequency: Weekly Modality: individual Progress: 100% 5. Related Problem: Eliminate or reduce the negative impact trauma related symptoms have on  social, occupational, and family functioning. Description: Verbalize an understanding of the treatment rationale for PTSD. Target Date: 2022-01-15 Frequency: Weekly Modality: individual Progress: 100% 6. Related Problem: Eliminate or reduce the negative impact trauma related symptoms have on  social, occupational, and family functioning. Description: Learn and implement calming skills. Target Date: 2023-01-16 Frequency: Weekly Modality: individual Progress: 90% ? Planned Intervention: Teach the client calming skills (e.g., breathing retraining, relaxation,calming self-talk) to use in and between sessions when feeling  overly distressed.              [name], PsyD

## 2022-10-12 ENCOUNTER — Ambulatory Visit (INDEPENDENT_AMBULATORY_CARE_PROVIDER_SITE_OTHER): Payer: 59 | Admitting: Psychology

## 2022-10-12 DIAGNOSIS — F4310 Post-traumatic stress disorder, unspecified: Secondary | ICD-10-CM

## 2022-10-12 NOTE — Progress Notes (Signed)
Date: 10/12/2022 Appointment Start Time: 8:03am Duration: 58 minutes Provider: Helmut Muster, PsyD Type of Session: Individual Therapy  Location of Patient: Home (private location) Location of Provider: Provider's Home (private office) Type of Contact: Caregility video visit with audio  Session Content: Today's appointment was a telepsychological visit due to COVID-19. Phoebie is aware it is her responsibility to secure confidentiality on her end of the session. She provided verbal consent to proceed with today's appointment. Prior to proceeding with today's appointment, Dereona's physical location at the time of this appointment was obtained as well a phone number she could be reached at in the event of technical difficulties. Danuta is aware of the limitations of teleheath visits and verbally consented to proceed.  This provider conducted a check-in. Ms. Kostick endorsed a low mood secondary to ongoing medical issues. This Clinical research associate assisted Mariabelen in processing her emotional experiences, reflecting and reinforcing her efforts, problem solving stressors, and formulating value congruent goals..  Ms. Cloos discussed how ongoing medical issues with currently unknown causes are leading to low mood and social withdrawal, although through conversation she shared how despite the aforementioned she has been working with her daughter and medical care team to identify and address various issues, how her relationship with one of her daughters and her son continues to improve through use of empathy and communication, and how a friend assisted her and spent time with her. She was agreeable with a reflection of her being valued by others. She noted how her perception of her grandson's death by suicide has changed as a result of various personal growth and reflection she has had, and stated plans to discuss this with her daughter when she perceives her to be a in a place to hear it, indicating it would be  emotionally difficult to do so but could help further relieve some of her daughter's "guilt." She denied having any other issues or concerns, and demonstrated an improved mood at the end of the appointment.   Ms. Piersma did not endorse experiencing suicidal or homicidal ideation, plan, or intent since last meeting with this Clinical research associate and described future plans that include ongoing efforts to improve their wellbeing.   Zahniya was receptive to today's appointment as evidenced by openness to sharing and responsiveness to feedback.  Interventions:  Provided empathic reflections and validation Reviewed content from the previous session Provided positive reinforcement Employed supportive psychotherapy interventions to facilitate reduced distress and to improve coping skills with identified stressors Engaged patient in goal setting Engaged patient in problem solving Psychoeducation provided regarding cognitive distortions  Explored patient's values  Reviewed learned skills  DSM-5 Diagnosis(es):  F43.10 Posttraumtic Stress Disorder  Plan: Carlan is currently scheduled for a follow-up appointment on 10/26/2022 at 8am via Caregility video visit with audio. The next session will focus on value congruent actions.   Treatment Goal & Progress:  1. Related Problem: Eliminate or reduce the negative impact trauma related symptoms have on  social, occupational, and family functioning. Description: Routinely use discussed therapeutic skills during stressors, relational issues,  and/or trauma reminders. Target Date: 2023-01-16 Frequency: Weekly Modality: individual Progress: 90%  2. Related Problem: Eliminate or reduce the negative impact trauma related symptoms have on  social, occupational, and family functioning. Description: Participate in Acceptance and Commitment Therapy (ACT) to reduce the impact  of the traumatic event. Target Date: 2022-01-15 Frequency: Weekly Modality:  individual Progress: 100% ? Planned Intervention: Use an ACT approach to PTSD to help the client experience and  accept the presence of  troubling thoughts and images without being overly impacted by  them, and committing his/her time and efforts to activities that are consistent with  identified, personally meaningful values. 3. Related Problem: Eliminate or reduce the negative impact trauma related symptoms have on social, occupational, and family functioning. Description: Learn and implement personal skills to manage challenging situations related to trauma. Target Date: 2022-01-15 Frequency: Weekly Modality: individual Progress: 100% 4. Related Problem: Eliminate or reduce the negative impact trauma related symptoms have on  social, occupational, and family functioning. Description: Participate in Cognitive Processing Therapy to process the trauma and reduce its  impact. Target Date: 2022-01-15 Frequency: Weekly Modality: individual Progress: 100% 5. Related Problem: Eliminate or reduce the negative impact trauma related symptoms have on  social, occupational, and family functioning. Description: Verbalize an understanding of the treatment rationale for PTSD. Target Date: 2022-01-15 Frequency: Weekly Modality: individual Progress: 100% 6. Related Problem: Eliminate or reduce the negative impact trauma related symptoms have on  social, occupational, and family functioning. Description: Learn and implement calming skills. Target Date: 2023-01-16 Frequency: Weekly Modality: individual Progress: 90% ? Planned Intervention: Teach the client calming skills (e.g., breathing retraining, relaxation,calming self-talk) to use in and between sessions when feeling overly distressed.             [name], PsyD

## 2022-10-26 ENCOUNTER — Ambulatory Visit (INDEPENDENT_AMBULATORY_CARE_PROVIDER_SITE_OTHER): Payer: 59 | Admitting: Psychology

## 2022-10-26 DIAGNOSIS — F4310 Post-traumatic stress disorder, unspecified: Secondary | ICD-10-CM

## 2022-10-26 NOTE — Progress Notes (Addendum)
Date: 10/26/2022 Appointment Start Time: 8am Duration: 60 minutes Provider: Helmut Muster, PsyD Type of Session: Individual Therapy  Location of Patient: Home (private location) Location of Provider: Provider's Home (private office) Type of Contact: Caregility video visit with audio  Session Content: Today's appointment was a telepsychological visit due to COVID-19. Courtney Trevino is aware it is her responsibility to secure confidentiality on her end of the session. She provided verbal consent to proceed with today's appointment. Prior to proceeding with today's appointment, Courtney Trevino's physical location at the time of this appointment was obtained as well a phone number she could be reached at in the event of technical difficulties. Courtney Trevino is aware of the limitations of teleheath visits and verbally consented to proceed.  This provider conducted a check-in. Ms. Calhoon endorsed ongoing stressors but an overall improved mood. This Clinical research associate assisted Courtney Trevino in processing her emotional experiences, reflecting and reinforcing her efforts, identifying her values and priorities, problem solving stressors, and formulating value congruent plans. Courtney Trevino discussed how she has been experiencing relational trains with various others, although expressed a belief they are resulting from differing values and that her actions have been value congruent. She also discussed how she has been increasingly prioritizing self-care, managing of ongoing medical issues, and pursuing volunteering and employment options that are value congruent (e.g,. volunteering with a political organization as well as applying to be in a commercial and a Industrial/product designer). 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 She denied having any other issues or concerns.   Courtney Trevino did not endorse experiencing suicidal or homicidal ideation, plan, or intent since last meeting with this Clinical research associate and described future plans that include ongoing efforts to improve their wellbeing.   This Clinical research associate notified Courtney Trevino he will be out of the office from 9/26/-10/9, and should they experience a mental health crisis to reach out to emergency services (e.g., [phone], 911, or going to his local ER). If they needed a sooner appointment, this writer instructed them to contact this writer's main office at [phone] to schedule with a covering clinician. They expressed understanding and denied having any other issues or concerns.    Courtney Trevino was receptive to today's appointment as evidenced by openness to sharing and responsiveness to feedback.  Interventions:  Provided empathic reflections and validation Reviewed content from the previous session Provided positive reinforcement Employed supportive psychotherapy interventions to facilitate reduced distress and to improve coping skills with identified stressors Engaged patient in goal setting Engaged patient in problem solving Psychoeducation provided regarding cognitive distortions  Psychoeducation provided regarding self-care Explored patient's values  Reviewed learned skills  DSM-5 Diagnosis(es):  F43.10 Posttraumtic Stress Disorder  Plan: Courtney Trevino is currently scheduled for a follow-up appointment on 11/09/2022 at 8am via Caregility video visit with audio. The next session will focus on value congruent actions.    Treatment Goal & Progress:  1. Related Problem: Eliminate or reduce the negative impact trauma related symptoms have on  social, occupational, and family functioning. Description: Routinely use discussed therapeutic skills during stressors, relational issues,  and/or trauma reminders. Target Date: 2023-01-16 Frequency:  Weekly Modality: individual Progress: 90%  2. Related Problem: Eliminate or reduce the negative impact trauma related symptoms have on  social,  occupational, and family functioning. Description: Participate in Acceptance and Commitment Therapy (ACT) to reduce the impact  of the traumatic event. Target Date: 2022-01-15 Frequency: Weekly Modality: individual Progress: 100% ? Planned Intervention: Use an ACT approach to PTSD to help the client experience and  accept the presence of troubling thoughts and images without being overly impacted by  them, and committing his/her time and efforts to activities that are consistent with  identified, personally meaningful values. 3. Related Problem: Eliminate or reduce the negative impact trauma related symptoms have on social, occupational, and family functioning. Description: Learn and implement personal skills to manage challenging situations related to trauma. Target Date: 2022-01-15 Frequency: Weekly Modality: individual Progress: 100% 4. Related Problem: Eliminate or reduce the negative impact trauma related symptoms have on  social, occupational, and family functioning. Description: Participate in Cognitive Processing Therapy to process the trauma and reduce its  impact. Target Date: 2022-01-15 Frequency: Weekly Modality: individual Progress: 100% 5. Related Problem: Eliminate or reduce the negative impact trauma related symptoms have on  social, occupational, and family functioning. Description: Verbalize an understanding of the treatment rationale for PTSD. Target Date: 2022-01-15 Frequency: Weekly Modality: individual Progress: 100% 6. Related Problem: Eliminate or reduce the negative impact trauma related symptoms have on  social, occupational, and family functioning. Description: Learn and implement calming skills. Target Date: 2023-01-16 Frequency: Weekly Modality: individual Progress: 90% ? Planned Intervention: Teach the  client calming skills (e.g., breathing retraining, relaxation,calming self-talk) to use in and between sessions when feeling overly distressed.              [name], PsyD
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
## 2022-11-09 ENCOUNTER — Ambulatory Visit (INDEPENDENT_AMBULATORY_CARE_PROVIDER_SITE_OTHER): Payer: 59 | Admitting: Psychology

## 2022-11-09 DIAGNOSIS — F4310 Post-traumatic stress disorder, unspecified: Secondary | ICD-10-CM | POA: Diagnosis not present

## 2022-11-23 ENCOUNTER — Ambulatory Visit (INDEPENDENT_AMBULATORY_CARE_PROVIDER_SITE_OTHER): Payer: 59 | Admitting: Psychology

## 2022-11-23 DIAGNOSIS — F4310 Post-traumatic stress disorder, unspecified: Secondary | ICD-10-CM | POA: Diagnosis not present

## 2022-11-23 NOTE — Progress Notes (Signed)
Date: 11/23/2022 Appointment Start Time: 8:04am (patient arrived late to the appointment) Duration: 56 minutes Provider: Helmut Muster, PsyD Type of Session: Individual Therapy  Location of Patient: Home (private location) Location of Provider: Provider's Home (private office) Type of Contact: Caregility video visit with audio  Session Content: Today's appointment was a telepsychological visit due to COVID-19. Courtney Trevino is aware it is her responsibility to secure confidentiality on her end of the session. She provided verbal consent to proceed with today's appointment. Prior to proceeding with today's appointment, Courtney Trevino's physical location at the time of this appointment was obtained as well a phone number she could be reached at in the event of technical difficulties. Courtney Trevino is aware of the limitations of teleheath visits and verbally consented to proceed.  This provider conducted a check-in. Courtney Trevino endorsed grief and ambivalence. This Clinical research associate assisted Lujain in processing her emotional experiences, reflecting and reinforcing her efforts, problem solving stressors, and formulating value congruent goals and ways to make space for the discomfort of various emotional experiences. Courtney Trevino discussed how various stressors (e.g., her aunt and a friend's infant son passing, relational strains with various others, and past trauma) have led to her feeling overwhelmed and uncertain on how to best assist her friend. She further discussed how past trauma and the stressors have contributed to hypervigilance about others not being genuine and strong emotional reactions when others are perceived to be not genuine as well as avoidance and intellectualizing emotionally difficult situations. Through conversation, she concluded with a desire to increasingly let down her "mask" of her emotional state and to acknowledge them with herself and trusted others, noting awareness that doing so may be emotionally painful  but likely helpful for her and her relationships with others in the long-term. She stated plans to engage in volunteer work and pleasurable activities as well as to have sources of comfort nearby to assist with emotional waves that may occur. She denied having any other issues or concerns.   Courtney Trevino did not endorse experiencing suicidal or homicidal ideation, plan, or intent since last meeting with this Clinical research associate and described future plans that include ongoing efforts to improve their wellbeing.   Courtney Trevino was receptive to today's appointment as evidenced by openness to sharing and responsiveness to feedback.  Interventions:  Provided empathic reflections and validation Reviewed content from the previous session Provided positive reinforcement Employed supportive psychotherapy interventions to facilitate reduced distress and to improve coping skills with identified stressors Engaged patient in goal setting Engaged patient in problem solving Psychoeducation provided regarding cognitive distortions  Psychoeducation provided regarding all-or-nothing thinking Psychoeducation provided regarding the connection between thoughts, feelings, and behaviors Psychoeducation provided regarding self-care Explored patient's values  Reviewed learned skills  DSM-5 Diagnosis(es):  F43.10 Posttraumtic Stress Disorder  Plan: Courtney Trevino is currently scheduled for a follow-up appointment on 12/07/2022 at 8am via Caregility video visit with audio. The next session will focus on value congruent actions.   Treatment Goal & Progress:  1. Related Problem: Eliminate or reduce the negative impact trauma related symptoms have on  social, occupational, and family functioning. Description: Routinely use discussed therapeutic skills during stressors, relational issues,  and/or trauma reminders. Target Date: 2023-01-16 Frequency: Weekly Modality: individual Progress: 90%  2. Related Problem: Eliminate or reduce the  negative impact trauma related symptoms have on  social, occupational, and family functioning. Description: Participate in Acceptance and Commitment Therapy (ACT) to reduce the impact  of the traumatic event. Target Date: 2022-01-15 Frequency: Weekly Modality: individual Progress: 100% ?  Planned Intervention: Use an ACT approach to PTSD to help the client experience and  accept the presence of troubling thoughts and images without being overly impacted by  them, and committing his/her time and efforts to activities that are consistent with  identified, personally meaningful values. 3. Related Problem: Eliminate or reduce the negative impact trauma related symptoms have on social, occupational, and family functioning. Description: Learn and implement personal skills to manage challenging situations related to trauma. Target Date: 2022-01-15 Frequency: Weekly Modality: individual Progress: 100% 4. Related Problem: Eliminate or reduce the negative impact trauma related symptoms have on  social, occupational, and family functioning. Description: Participate in Cognitive Processing Therapy to process the trauma and reduce its  impact. Target Date: 2022-01-15 Frequency: Weekly Modality: individual Progress: 100% 5. Related Problem: Eliminate or reduce the negative impact trauma related symptoms have on  social, occupational, and family functioning. Description: Verbalize an understanding of the treatment rationale for PTSD. Target Date: 2022-01-15 Frequency: Weekly Modality: individual Progress: 100% 6. Related Problem: Eliminate or reduce the negative impact trauma related symptoms have on  social, occupational, and family functioning. Description: Learn and implement calming skills. Target Date: 2023-01-16 Frequency: Weekly Modality: individual Progress: 90% ? Planned Intervention: Teach the client calming skills (e.g., breathing retraining, relaxation,calming self-talk) to use in  and between sessions when feeling overly distressed.   Courtney Trevino was agreeable with the treatment plan.                [name], PsyD

## 2022-12-07 ENCOUNTER — Ambulatory Visit: Payer: 59 | Admitting: Psychology

## 2022-12-07 DIAGNOSIS — F4310 Post-traumatic stress disorder, unspecified: Secondary | ICD-10-CM | POA: Diagnosis not present
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
## 2022-12-21 ENCOUNTER — Ambulatory Visit: Payer: 59 | Admitting: Psychology

## 2022-12-21 DIAGNOSIS — F4310 Post-traumatic stress disorder, unspecified: Secondary | ICD-10-CM

## 2022-12-21 NOTE — Progress Notes (Signed)
Date: 12/21/2022 Appointment Start Time: 8am Duration: 60 minutes Provider: Helmut Muster, PsyD Type of Session: Individual Therapy  Location of Patient: Home (private location) Location of Provider: Provider's Home (private office) Type of Contact: Caregility video visit with audio  Session Content: Today's appointment was a telepsychological visit due to COVID-19. Channel is aware it is her responsibility to secure confidentiality on her end of the session. She provided verbal consent to proceed with today's appointment. Prior to proceeding with today's appointment, Starkeisha's physical location at the time of this appointment was obtained as well a phone number she could be reached at in the event of technical difficulties. Akansha is aware of the limitations of teleheath visits and verbally consented to proceed.  This provider conducted a check-in. Ms. Gadberry endorsed a low mood and fatigue. This Clinical research associate assisted Sharyon in processing her emotional experiences, reflecting and reinforcing her efforts, identifying her signs of growth and values, problem solving stressors, and formulating plans. Mr. Neary discussed how a recent funeral and the presidential election have led to feelings of disappointment, anger, anxiety, and fatigue. Through conversation, she described how despite the abovementioend emotions, she was appreciative of various other's support, has been increasingly engaging in self care and setting boundaries, defusing from areas outside her control, and expressing her emotional experiences, noting how doing so has reduced psychological suffering. She stated plans to continue her efforts, sharing how doing so also helps her to further connect to her values and priorities. She expressed a desire to discuss a change to her aide at the next appointment. She denied having any other issues or concerns.   Ms. Rauh did not endorse experiencing suicidal or homicidal ideation, plan, or intent  since last meeting with this Clinical research associate and described future plans that include ongoing efforts to improve their wellbeing.   Jadamarie was receptive to today's appointment as evidenced by openness to sharing and responsiveness to feedback.  Interventions:  Provided empathic reflections and validation Reviewed content from the previous session Provided positive reinforcement Employed supportive psychotherapy interventions to facilitate reduced distress and to improve coping skills with identified stressors Engaged patient in goal setting Engaged patient in problem solving Psychoeducation provided regarding cognitive distortions  Psychoeducation provided regarding self-care Explored patient's values  Reviewed learned skills  DSM-5 Diagnosis(es):  F43.10 Posttraumtic Stress Disorder  Plan: Teagen is currently scheduled for a follow-up appointment on 01/04/2023 at 8am via Caregility video visit with audio. The next session will focus on value congruent ations.    Treatment Goal & Progress:  1. Related Problem: Eliminate or reduce the negative impact trauma related symptoms have on  social, occupational, and family functioning. Description: Routinely use discussed therapeutic skills during stressors, relational issues,  and/or trauma reminders. Target Date: 2023-01-16 Frequency: Weekly Modality: individual Progress: 90%  2. Related Problem: Eliminate or reduce the negative impact trauma related symptoms have on  social, occupational, and family functioning. Description: Participate in Acceptance and Commitment Therapy (ACT) to reduce the impact  of the traumatic event. Target Date: 2022-01-15 Frequency: Weekly Modality: individual Progress: 100% ? Planned Intervention: Use an ACT approach to PTSD to help the client experience and  accept the presence of troubling thoughts and images without being overly impacted by  them, and committing his/her time and efforts to activities that  are consistent with  identified, personally meaningful values. 3. Related Problem: Eliminate or reduce the negative impact trauma related symptoms have on social, occupational, and family functioning. Description: Learn and implement personal skills to manage challenging  situations related to trauma. Target Date: 2022-01-15 Frequency: Weekly Modality: individual Progress: 100% 4. Related Problem: Eliminate or reduce the negative impact trauma related symptoms have on  social, occupational, and family functioning. Description: Participate in Cognitive Processing Therapy to process the trauma and reduce its  impact. Target Date: 2022-01-15 Frequency: Weekly Modality: individual Progress: 100% 5. Related Problem: Eliminate or reduce the negative impact trauma related symptoms have on  social, occupational, and family functioning. Description: Verbalize an understanding of the treatment rationale for PTSD. Target Date: 2022-01-15 Frequency: Weekly Modality: individual Progress: 100% 6. Related Problem: Eliminate or reduce the negative impact trauma related symptoms have on  social, occupational, and family functioning. Description: Learn and implement calming skills. Target Date: 2023-01-16 Frequency: Weekly Modality: individual Progress: 90% ? Planned Intervention: Teach the client calming skills (e.g., breathing retraining, relaxation,calming self-talk) to use in and between sessions when feeling overly distressed.   Ms. Bihm was agreeable with the treatment plan.               [name], PsyD

## 2023-01-04 ENCOUNTER — Ambulatory Visit: Payer: 59 | Admitting: Psychology

## 2023-01-04 DIAGNOSIS — F4310 Post-traumatic stress disorder, unspecified: Secondary | ICD-10-CM

## 2023-01-04 NOTE — Progress Notes (Signed)
Date: 01/04/2023 Appointment Start Time: 8:02am Duration: 58 minutes Provider: Helmut Muster, PsyD Type of Session: Individual Therapy  Location of Patient: Home (private location) Location of Provider: Provider's Home (private office) Type of Contact: [employer] Teams video visit with audio  Session Content: Today's appointment was a telepsychological visit due to COVID-19. Courtney Trevino is aware it is her responsibility to secure confidentiality on her end of the session. She provided verbal consent to proceed with today's appointment. Prior to proceeding with today's appointment, Courtney Trevino's physical location at the time of this appointment was obtained as well a phone number she could be reached at in the event of technical difficulties. Courtney Trevino is aware of the limitations of teleheath visits and verbally consented to proceed.  This provider conducted a check-in. Courtney Trevino endorsed stress. This Clinical research associate assisted Chanette in processing her emotional experiences, reflecting and reinforcing her efforts, problem solving stressors, identifying signs of growth and value congruent actions, and formulating value congruent plans. Courtney Trevino discussed how the recent election, a wrist injury, and relational strains with a romantic interest have contributed to stress and low mood, although shared how she has been prioritizing self-care and attending medical appointments, has plans to attend an upcoming book club and spend time with family, and expressed her concerns to the romantic interest. She described not attending a birthday party she had planned to secondary to her injury and concerns about its location, adding how she notified the family member holding the event and how her decision was not based out of self-care (versus anxiety-related avoidance). She expressed excitement about her ucoming social plans as well as shared plans to present at the book club about a value congruent topic and to speak with her  insurance about potential alternative care assistant organizations that may better fit her needs. She denied having nay other issues or concerns.   Courtney Trevino did not endorse experiencing suicidal or homicidal ideation, plan, or intent since last meeting with this Clinical research associate and described future plans that include ongoing efforts to improve their wellbeing.   Courtney Trevino was receptive to today's appointment as evidenced by openness to sharing and responsiveness to feedback.  Interventions:  Provided empathic reflections and validation Reviewed content from the previous session Provided positive reinforcement Employed supportive psychotherapy interventions to facilitate reduced distress and to improve coping skills with identified stressors Engaged patient in goal setting Engaged patient in problem solving Psychoeducation provided regarding cognitive distortions  Psychoeducation provided regarding self-care Explored patient's values  Reviewed learned skills  DSM-5 Diagnosis(es):  F43.10 Posttraumtic Stress Disorder  Plan: Courtney Trevino is currently scheduled for a follow-up appointment on 01/18/2023 at 8am via Caregility video visit with audio. The next session will focus on value congruent actions.    Treatment Goal & Progress:  1. Related Problem: Eliminate or reduce the negative impact trauma related symptoms have on  social, occupational, and family functioning. Description: Routinely use discussed therapeutic skills during stressors, relational issues,  and/or trauma reminders. Target Date: 2023-01-16 Frequency: Weekly Modality: individual Progress: 90%  2. Related Problem: Eliminate or reduce the negative impact trauma related symptoms have on  social, occupational, and family functioning. Description: Participate in Acceptance and Commitment Therapy (ACT) to reduce the impact  of the traumatic event. Target Date: 2022-01-15 Frequency: Weekly Modality: individual Progress: 100% ?  Planned Intervention: Use an ACT approach to PTSD to help the client experience and  accept the presence of troubling thoughts and images without being overly impacted by  them, and committing his/her time and efforts  to activities that are consistent with  identified, personally meaningful values. 3. Related Problem: Eliminate or reduce the negative impact trauma related symptoms have on social, occupational, and family functioning. Description: Learn and implement personal skills to manage challenging situations related to trauma. Target Date: 2022-01-15 Frequency: Weekly Modality: individual Progress: 100% 4. Related Problem: Eliminate or reduce the negative impact trauma related symptoms have on  social, occupational, and family functioning. Description: Participate in Cognitive Processing Therapy to process the trauma and reduce its  impact. Target Date: 2022-01-15 Frequency: Weekly Modality: individual Progress: 100% 5. Related Problem: Eliminate or reduce the negative impact trauma related symptoms have on  social, occupational, and family functioning. Description: Verbalize an understanding of the treatment rationale for PTSD. Target Date: 2022-01-15 Frequency: Weekly Modality: individual Progress: 100% 6. Related Problem: Eliminate or reduce the negative impact trauma related symptoms have on  social, occupational, and family functioning. Description: Learn and implement calming skills. Target Date: 2023-01-16 Frequency: Weekly Modality: individual Progress: 90% ? Planned Intervention: Teach the client calming skills (e.g., breathing retraining, relaxation,calming self-talk) to use in and between sessions when feeling overly distressed.   Courtney Trevino was agreeable with the treatment plan.               [name], PsyD

## 2023-01-18 ENCOUNTER — Ambulatory Visit: Payer: 59 | Admitting: Psychology

## 2023-01-18 DIAGNOSIS — F4310 Post-traumatic stress disorder, unspecified: Secondary | ICD-10-CM

## 2023-01-18 NOTE — Progress Notes (Addendum)
Date: 01/18/2023 Appointment Start Time: 8am Duration: 60 minutes Provider: Helmut Muster, PsyD Type of Session: Individual Therapy  Location of Patient: Home (private location) Location of Provider: Provider's Home (private office) Type of Contact: Caregility video visit with audio  Session Content: Today's appointment was a telepsychological visit due to COVID-19. Courtney Trevino is aware it is her responsibility to secure confidentiality on her end of the session. She provided verbal consent to proceed with today's appointment. Prior to proceeding with today's appointment, Devota's physical location at the time of this appointment was obtained as well a phone number she could be reached at in the event of technical difficulties. Gracen is aware of the limitations of teleheath visits and verbally consented to proceed.  This provider conducted a check-in. Ms. Redding endorsed anxiety. This Clinical research associate assisted Taheerah in processing her emotional experiences, reflecting and reinforcing her efforts, prioritizing and problem solving stressors, identifying her values and warning signs for her wellbeing, and formulating plans. Ms. Thornbury discussed examples of various stressors and relational strains, noting how they have caused irritability and elevated blood pressure. She further discussed how despite the aforementioned, she has not felt "alone" and has been making value congruent actions through use of gratitude and spirituality. Through conversation, she noted how efforts to "rush things" and discomfort with asking for assistance are obstacles to value congruent actions. She also noted how her priorities for the coming week are finding a new aide, getting a pedicure, scheduling a surgery, and ongoing efforts to support various loved ones. She expressed a belief the aforementioned is manageable, and stated a desire to discuss her discomfort with asking for assistance further at the follow-up appointment. She denied  having any other issues or concerns.   Ms. Mocarski did not endorse experiencing suicidal or homicidal ideation, plan, or intent since last meeting with this Clinical research associate and described future plans that include ongoing efforts to improve their wellbeing.   Tonoa was receptive to today's appointment as evidenced by openness to sharing and responsiveness to feedback.  Interventions:  Provided empathic reflections and validation Reviewed content from the previous session Provided positive reinforcement Employed supportive psychotherapy interventions to facilitate reduced distress and to improve coping skills with identified stressors Engaged patient in goal setting Engaged patient in problem solving Psychoeducation provided regarding cognitive distortions  Psychoeducation provided regarding self-care Explored patient's values  Reviewed learned skills  DSM-5 Diagnosis(es):  F43.10 Posttraumtic Stress Disorder  Plan: Conlee is currently scheduled for a follow-up appointment on 02/01/2023 at 8am via Caregility video visit with audio. The next session will focus on value congruent actions.    Treatment Goal & Progress:  1. Related Problem: Eliminate or reduce the negative impact trauma related symptoms have on  social, occupational, and family functioning. Description: Routinely use discussed therapeutic skills during stressors, relational issues,  and/or trauma reminders. Target Date: 2024-01-16 Frequency: Weekly Modality: individual Progress: 90%  2. Related Problem: Eliminate or reduce the negative impact trauma related symptoms have on  social, occupational, and family functioning. Description: Participate in Acceptance and Commitment Therapy (ACT) to reduce the impact  of the traumatic event. Target Date: 2022-01-15 Frequency: Weekly Modality: individual Progress: 100% ? Planned Intervention: Use an ACT approach to PTSD to help the client experience and  accept the presence of  troubling thoughts and images without being overly impacted by  them, and committing his/her time and efforts to activities that are consistent with  identified, personally meaningful values. 3. Related Problem: Eliminate or reduce the negative impact trauma related  symptoms have on social, occupational, and family functioning. Description: Learn and implement personal skills to manage challenging situations related to trauma. Target Date: 2022-01-15 Frequency: Weekly Modality: individual Progress: 100% 4. Related Problem: Eliminate or reduce the negative impact trauma related symptoms have on  social, occupational, and family functioning. Description: Participate in Cognitive Processing Therapy to process the trauma and reduce its  impact. Target Date: 2022-01-15 Frequency: Weekly Modality: individual Progress: 100% 5. Related Problem: Eliminate or reduce the negative impact trauma related symptoms have on  social, occupational, and family functioning. Description: Verbalize an understanding of the treatment rationale for PTSD. Target Date: 2022-01-15 Frequency: Weekly Modality: individual Progress: 100% 6. Related Problem: Eliminate or reduce the negative impact trauma related symptoms have on  social, occupational, and family functioning. Description: Learn and implement calming skills. Target Date: 2024-01-16 Frequency: Weekly Modality: individual Progress: 90% ? Planned Intervention: Teach the client calming skills (e.g., breathing retraining, relaxation,calming self-talk) to use in and between sessions when feeling overly distressed.   Ms. Muraoka was agreeable with the treatment plan.              [name], PsyD

## 2023-01-19 ENCOUNTER — Encounter (HOSPITAL_BASED_OUTPATIENT_CLINIC_OR_DEPARTMENT_OTHER): Payer: Self-pay | Admitting: Orthopedic Surgery

## 2023-01-19 ENCOUNTER — Other Ambulatory Visit: Payer: Self-pay

## 2023-01-20 ENCOUNTER — Encounter (HOSPITAL_BASED_OUTPATIENT_CLINIC_OR_DEPARTMENT_OTHER)
Admission: RE | Admit: 2023-01-20 | Discharge: 2023-01-20 | Disposition: A | Discharge status: Home / Self Care | Payer: 59 | Source: Ambulatory Visit | Attending: Orthopedic Surgery | Admitting: Orthopedic Surgery

## 2023-01-20 DIAGNOSIS — Z01818 Encounter for other preprocedural examination: Secondary | ICD-10-CM | POA: Diagnosis present

## 2023-01-20 LAB — BASIC METABOLIC PANEL
Anion gap: 8 (ref 5–15)
BUN: 25 mg/dL — ABNORMAL HIGH (ref 6–20)
CO2: 23 mmol/L (ref 22–32)
Calcium: 9.7 mg/dL (ref 8.9–10.3)
Chloride: 107 mmol/L (ref 98–111)
Creatinine, Ser: 1.19 mg/dL — ABNORMAL HIGH (ref 0.44–1.00)
GFR, Estimated: 52 mL/min — ABNORMAL LOW (ref >60)
Glucose, Bld: 108 mg/dL — ABNORMAL HIGH (ref 70–99)
Potassium: 4.9 mmol/L (ref 3.5–5.1)
Sodium: 138 mmol/L (ref 135–145)

## 2023-01-24 NOTE — Anesthesia Preprocedure Evaluation (Addendum)
Anesthesia Evaluation  Patient identified by MRN, date of birth, ID band Patient awake    Reviewed: Allergy & Precautions, NPO status , Patient's Chart, lab work & pertinent test results  History of Anesthesia Complications Negative for: history of anesthetic complications  Airway Mallampati: III  TM Distance: >3 FB Neck ROM: Full    Dental  (+) Dental Advisory Given,    Pulmonary neg shortness of breath, sleep apnea (intolerant of CPAP) , COPD (used inhaler this morning),  COPD inhaler, neg recent URI, Current Smoker and Patient abstained from smoking.   Pulmonary exam normal breath sounds clear to auscultation       Cardiovascular hypertension (amiloride, carvedilol, losartan), Pt. on medications and Pt. on home beta blockers + Peripheral Vascular Disease and +CHF  (-) CAD, (-) Cardiac Stents and (-) CABG (-) dysrhythmias + Valvular Problems/Murmurs (mild) MR  Rhythm:Regular Rate:Normal  HLD  Low-risk stress test 09/29/2021  TTE 09/29/2021: SUMMARY   There is mild concentric left ventricular hypertrophy with normal wall  motion, normal systolic function and ejection fraction  55-60% .  There is mild diastolic dysfunction, Grade IA, with normal left atrial  pressure.  The right ventricle is normal in size and function.  The atria are normal in size.  There is mild mitral regurgitation.  There was insufficient TR detected to calculate RV systolic pressure.  The IVC is normal in size with an inspiratory collapse of greater then  50%, suggesting normal right atrial pressure.     Neuro/Psych  Headaches, Seizures - (last seizure summer 2023), Well Controlled,  PSYCHIATRIC DISORDERS Anxiety Depression    CVA (2008, 2010, left-sided weakness), Residual Symptoms    GI/Hepatic ,GERD  ,,(+)     substance abuse  marijuana useGastroparesis    Endo/Other  diabetes, Type 2    Renal/GU CRFRenal disease      Musculoskeletal  (+)  Fibromyalgia -  Abdominal  (+) + obese  Peds  Hematology  (+) Blood dyscrasia, anemia   Anesthesia Other Findings Seizure in 2015 with lidocaine. Has received ropivacaine for nerve block in 2020 with no issues.  Reports numbness in her right index and ring fingers.  Reproductive/Obstetrics                             Anesthesia Physical Anesthesia Plan  ASA: 3  Anesthesia Plan: Regional and MAC   Post-op Pain Management: Regional block* and Tylenol PO (pre-op)*   Induction: Intravenous  PONV Risk Score and Plan: 1 and Ondansetron, Dexamethasone and Treatment may vary due to age or medical condition  Airway Management Planned: Natural Airway and Simple Face Mask  Additional Equipment:   Intra-op Plan:   Post-operative Plan:   Informed Consent: I have reviewed the patients History and Physical, chart, labs and discussed the procedure including the risks, benefits and alternatives for the proposed anesthesia with the patient or authorized representative who has indicated his/her understanding and acceptance.     Dental advisory given  Plan Discussed with: CRNA and Anesthesiologist  Anesthesia Plan Comments: (Discussed potential risks of nerve blocks including, but not limited to, infection, bleeding, nerve damage, seizures, pneumothorax, respiratory depression, and potential failure of the block. Alternatives to nerve blocks discussed. All questions answered.  Discussed with patient risks of MAC including, but not limited to, minor pain or discomfort, hearing people in the room, and possible need for backup general anesthesia. Risks for general anesthesia also discussed including, but not limited  to, sore throat, hoarse voice, chipped/damaged teeth, injury to vocal cords, nausea and vomiting, allergic reactions, lung infection, heart attack, stroke, and death. All questions answered. )       Anesthesia Quick  Evaluation

## 2023-01-25 ENCOUNTER — Ambulatory Visit (HOSPITAL_BASED_OUTPATIENT_CLINIC_OR_DEPARTMENT_OTHER): Payer: 59 | Admitting: Anesthesiology

## 2023-01-25 ENCOUNTER — Encounter (HOSPITAL_BASED_OUTPATIENT_CLINIC_OR_DEPARTMENT_OTHER)
Admission: RE | Disposition: A | Discharge status: Home / Self Care | Payer: Self-pay | Source: Home / Self Care | Attending: Orthopedic Surgery

## 2023-01-25 ENCOUNTER — Other Ambulatory Visit: Payer: Self-pay

## 2023-01-25 ENCOUNTER — Ambulatory Visit (HOSPITAL_BASED_OUTPATIENT_CLINIC_OR_DEPARTMENT_OTHER): Payer: 59

## 2023-01-25 ENCOUNTER — Encounter (HOSPITAL_BASED_OUTPATIENT_CLINIC_OR_DEPARTMENT_OTHER): Payer: Self-pay | Admitting: Orthopedic Surgery

## 2023-01-25 ENCOUNTER — Ambulatory Visit (HOSPITAL_BASED_OUTPATIENT_CLINIC_OR_DEPARTMENT_OTHER)
Admission: RE | Admit: 2023-01-25 | Discharge: 2023-01-25 | Disposition: A | Discharge status: Home / Self Care | Payer: 59 | Attending: Orthopedic Surgery | Admitting: Orthopedic Surgery

## 2023-01-25 DIAGNOSIS — S5331XA Traumatic rupture of right ulnar collateral ligament, initial encounter: Secondary | ICD-10-CM

## 2023-01-25 DIAGNOSIS — S63418A Traumatic rupture of collateral ligament of other finger at metacarpophalangeal and interphalangeal joint, initial encounter: Secondary | ICD-10-CM | POA: Diagnosis present

## 2023-01-25 DIAGNOSIS — Z7951 Long term (current) use of inhaled steroids: Secondary | ICD-10-CM | POA: Insufficient documentation

## 2023-01-25 DIAGNOSIS — I130 Hypertensive heart and chronic kidney disease with heart failure and stage 1 through stage 4 chronic kidney disease, or unspecified chronic kidney disease: Secondary | ICD-10-CM | POA: Insufficient documentation

## 2023-01-25 DIAGNOSIS — I509 Heart failure, unspecified: Secondary | ICD-10-CM | POA: Insufficient documentation

## 2023-01-25 DIAGNOSIS — G4730 Sleep apnea, unspecified: Secondary | ICD-10-CM | POA: Diagnosis not present

## 2023-01-25 DIAGNOSIS — K219 Gastro-esophageal reflux disease without esophagitis: Secondary | ICD-10-CM | POA: Diagnosis not present

## 2023-01-25 DIAGNOSIS — E1122 Type 2 diabetes mellitus with diabetic chronic kidney disease: Secondary | ICD-10-CM | POA: Diagnosis not present

## 2023-01-25 DIAGNOSIS — F17210 Nicotine dependence, cigarettes, uncomplicated: Secondary | ICD-10-CM | POA: Diagnosis not present

## 2023-01-25 DIAGNOSIS — N184 Chronic kidney disease, stage 4 (severe): Secondary | ICD-10-CM | POA: Diagnosis not present

## 2023-01-25 DIAGNOSIS — J449 Chronic obstructive pulmonary disease, unspecified: Secondary | ICD-10-CM | POA: Insufficient documentation

## 2023-01-25 DIAGNOSIS — Z01818 Encounter for other preprocedural examination: Secondary | ICD-10-CM

## 2023-01-25 DIAGNOSIS — W19XXXA Unspecified fall, initial encounter: Secondary | ICD-10-CM | POA: Insufficient documentation

## 2023-01-25 DIAGNOSIS — M797 Fibromyalgia: Secondary | ICD-10-CM | POA: Insufficient documentation

## 2023-01-25 HISTORY — DX: Heart failure, unspecified: I50.9

## 2023-01-25 HISTORY — PX: ULNAR COLLATERAL LIGAMENT REPAIR: SHX6159

## 2023-01-25 LAB — GLUCOSE, CAPILLARY
Glucose-Capillary: 96 mg/dL (ref 70–99)
Glucose-Capillary: 91 mg/dL (ref 70–99)

## 2023-01-25 SURGERY — REPAIR, LIGAMENT, ULNAR COLLATERAL
Anesthesia: Monitor Anesthesia Care | Site: Thumb | Laterality: Right

## 2023-01-25 MED ORDER — PROPOFOL 10 MG/ML IV BOLUS
INTRAVENOUS | Status: DC | PRN
  Administered 2023-01-25 (×5): 50 mg via INTRAVENOUS

## 2023-01-25 MED ORDER — PROPOFOL 500 MG/50ML IV EMUL
INTRAVENOUS | Status: AC
  Filled 2023-01-25: qty 50

## 2023-01-25 MED ORDER — LACTATED RINGERS IV SOLN: INTRAVENOUS | Status: DC

## 2023-01-25 MED ORDER — CLINDAMYCIN PHOSPHATE 900 MG/50ML IV SOLN
INTRAVENOUS | Status: DC | PRN
  Administered 2023-01-25: 900 mg via INTRAVENOUS

## 2023-01-25 MED ORDER — FENTANYL CITRATE (PF) 100 MCG/2ML IJ SOLN
INTRAMUSCULAR | Status: AC
  Filled 2023-01-25: qty 2

## 2023-01-25 MED ORDER — AMISULPRIDE (ANTIEMETIC) 5 MG/2ML IV SOLN: 10.0000 mg | Freq: Once | INTRAVENOUS | Status: DC | PRN

## 2023-01-25 MED ORDER — FENTANYL CITRATE (PF) 100 MCG/2ML IJ SOLN
100.0000 ug | Freq: Once | INTRAMUSCULAR | Status: AC
  Administered 2023-01-25: 50 ug via INTRAVENOUS

## 2023-01-25 MED ORDER — PROPOFOL 500 MG/50ML IV EMUL
INTRAVENOUS | Status: DC | PRN
  Administered 2023-01-25: 100 ug/kg/min via INTRAVENOUS

## 2023-01-25 MED ORDER — FENTANYL CITRATE (PF) 100 MCG/2ML IJ SOLN: 25.0000 ug | INTRAMUSCULAR | Status: DC | PRN

## 2023-01-25 MED ORDER — CEFAZOLIN SODIUM-DEXTROSE 2-4 GM/100ML-% IV SOLN
INTRAVENOUS | Status: AC
  Filled 2023-01-25: qty 100

## 2023-01-25 MED ORDER — ROPIVACAINE HCL 5 MG/ML IJ SOLN
INTRAMUSCULAR | Status: DC | PRN
  Administered 2023-01-25: 30 mL via PERINEURAL

## 2023-01-25 MED ORDER — MIDAZOLAM HCL 2 MG/2ML IJ SOLN
2.0000 mg | Freq: Once | INTRAMUSCULAR | Status: AC
  Administered 2023-01-25: 2 mg via INTRAVENOUS

## 2023-01-25 MED ORDER — 0.9 % SODIUM CHLORIDE (POUR BTL) OPTIME
TOPICAL | Status: DC | PRN
  Administered 2023-01-25: 500 mL

## 2023-01-25 MED ORDER — CLINDAMYCIN PHOSPHATE 900 MG/50ML IV SOLN
INTRAVENOUS | Status: AC
  Filled 2023-01-25: qty 50

## 2023-01-25 MED ORDER — MIDAZOLAM HCL 2 MG/2ML IJ SOLN
INTRAMUSCULAR | Status: AC
Start: 2023-01-25 — End: ?
  Filled 2023-01-25: qty 2

## 2023-01-25 SURGICAL SUPPLY — 42 items
ANCHOR CORKSCREW NANO FT 1.7X5 (Anchor) IMPLANT
ANCHOR FT CORKSCREW MICRO 2-0 (Anchor) IMPLANT
BLADE SURG 15 STRL LF DISP TIS (BLADE) ×1 IMPLANT
BNDG ELASTIC 3INX 5YD STR LF (GAUZE/BANDAGES/DRESSINGS) ×1 IMPLANT
BNDG ESMARK 4X9 LF (GAUZE/BANDAGES/DRESSINGS) ×1 IMPLANT
BNDG GAUZE DERMACEA FLUFF 4 (GAUZE/BANDAGES/DRESSINGS) ×1 IMPLANT
BNDG PLASTER X FAST 3X3 WHT LF (CAST SUPPLIES) ×10 IMPLANT
BNDG PLASTER X FAST 4X5 WHT LF (CAST SUPPLIES) ×10 IMPLANT
CHLORAPREP W/TINT 26 (MISCELLANEOUS) ×1 IMPLANT
CORD BIPOLAR FORCEPS 12FT (ELECTRODE) ×1 IMPLANT
COVER BACK TABLE 60X90IN (DRAPES) ×1 IMPLANT
COVER MAYO STAND STRL (DRAPES) ×1 IMPLANT
CUFF TOURN SGL QUICK 18X4 (TOURNIQUET CUFF) IMPLANT
CUFF TRNQT CYL 24X4X16.5-23 (TOURNIQUET CUFF) IMPLANT
DRAPE EXTREMITY T 121X128X90 (DISPOSABLE) ×1 IMPLANT
DRAPE OEC MINIVIEW 54X84 (DRAPES) ×1 IMPLANT
DRAPE SURG 17X23 STRL (DRAPES) ×1 IMPLANT
DRAPE U-SHAPE 47X51 STRL (DRAPES) ×1 IMPLANT
GAUZE SPONGE 4X4 12PLY STRL (GAUZE/BANDAGES/DRESSINGS) IMPLANT
GAUZE XEROFORM 1X8 LF (GAUZE/BANDAGES/DRESSINGS) IMPLANT
GLOVE BIO SURGEON STRL SZ7 (GLOVE) ×1 IMPLANT
GLOVE BIOGEL PI IND STRL 7.0 (GLOVE) ×1 IMPLANT
GOWN STRL REUS W/ TWL LRG LVL3 (GOWN DISPOSABLE) ×1 IMPLANT
GOWN STRL REUS W/ TWL XL LVL3 (GOWN DISPOSABLE) ×1 IMPLANT
NDL HYPO 25X1 1.5 SAFETY (NEEDLE) IMPLANT
NEEDLE HYPO 25X1 1.5 SAFETY (NEEDLE)
NS IRRIG 1000ML POUR BTL (IV SOLUTION) ×1 IMPLANT
PACK BASIN DAY SURGERY FS (CUSTOM PROCEDURE TRAY) ×1 IMPLANT
PAD CAST 3X4 CTTN HI CHSV (CAST SUPPLIES) ×1 IMPLANT
SLEEVE SCD COMPRESS KNEE MED (STOCKING) IMPLANT
SLING ARM FOAM STRAP LRG (SOFTGOODS) IMPLANT
SPLINT FIBERGLASS 4X30 (CAST SUPPLIES) IMPLANT
SUT ETHIBOND 3-0 V-5 (SUTURE) IMPLANT
SUT ETHILON 4 0 PS 2 18 (SUTURE) ×1 IMPLANT
SUT MERSILENE 4 0 P 3 (SUTURE) IMPLANT
SUT MNCRL AB 3-0 PS2 18 (SUTURE) IMPLANT
SUT MNCRL AB 4-0 PS2 18 (SUTURE) ×1 IMPLANT
SUT VIC AB 4-0 PS2 18 (SUTURE) IMPLANT
SYR BULB EAR ULCER 3OZ GRN STR (SYRINGE) ×1 IMPLANT
SYR CONTROL 10ML LL (SYRINGE) IMPLANT
TOWEL GREEN STERILE FF (TOWEL DISPOSABLE) ×2 IMPLANT
UNDERPAD 30X36 HEAVY ABSORB (UNDERPADS AND DIAPERS) ×1 IMPLANT

## 2023-01-25 NOTE — Progress Notes (Signed)
Assisted Dr. Ardeen Jourdain with right supraclavicular, ultrasound guided block. Side rails up, monitors on throughout procedure. See vital signs in flow sheet. Tolerated Procedure well.

## 2023-01-25 NOTE — Anesthesia Procedure Notes (Signed)
Anesthesia Regional Block: Supraclavicular block   Pre-Anesthetic Checklist: , timeout performed,  Correct Patient, Correct Site, Correct Laterality,  Correct Procedure, Correct Position, site marked,  Risks and benefits discussed,  Surgical consent,  Pre-op evaluation,  At surgeon's request and post-op pain management  Laterality: Right  Prep: chloraprep       Needles:  Injection technique: Single-shot  Needle Type: Echogenic Stimulator Needle     Needle Length: 9cm  Needle Gauge: 21     Additional Needles:   Procedures:,,,, ultrasound used (permanent image in chart),,    Narrative:  Start time: 01/25/2023 11:28 AM End time: 01/25/2023 11:32 AM Injection made incrementally with aspirations every 5 mL.  Performed by: Personally  Anesthesiologist: Linton Rump, MD  Additional Notes: Discussed risks and benefits of nerve block including, but not limited to, prolonged and/or permanent nerve injury involving sensory and/or motor function. Monitors were applied and a time-out was performed. The nerve and associated structures were visualized under ultrasound guidance. After negative aspiration, local anesthetic was slowly injected around the nerve. There was no evidence of high pressure during the procedure. There were no paresthesias. VSS remained stable and the patient tolerated the procedure well.

## 2023-01-25 NOTE — Op Note (Signed)
Date of Surgery: 01/25/2023  INDICATIONS: Patient is a 62 y.o.-year-old female with a right thumb MCP ulnar collateral ligament injury that was confirmed on MRI.  She has significant instability at her MCPJ.  She has elected to proceed with surgical management of her injury.  Risks, benefits, and alternatives to surgery were again discussed with the patient in the preoperative area. The patient wishes to proceed with surgery.  Informed consent was signed after our discussion.   PREOPERATIVE DIAGNOSIS:  Right thumb ulnar collateral ligament rupture  POSTOPERATIVE DIAGNOSIS: Same.  PROCEDURE:  Right thumb ulnar collateral ligament repair   SURGEON: Waylan Rocher, M.D.  ASSIST: None  ANESTHESIA:  Regional, MAC  IV FLUIDS AND URINE: See anesthesia.  ESTIMATED BLOOD LOSS: <5 mL.  IMPLANTS:  Implant Name Type Inv. Item Serial No. Manufacturer Lot No. LRB No. Used Action  ANCHOR FT CORKSCREW MICRO 2-0 - [id number] Anchor ANCHOR FT CORKSCREW MICRO 2-0  [employer] [id number] Right 1 Implanted  ANCHOR CORKSCREW NANO FT 1.7X5 - [id number] Anchor ANCHOR CORKSCREW NANO FT 1.7X5  [employer]  Right 1 Implanted     DRAINS: None  COMPLICATIONS: None  DESCRIPTION OF PROCEDURE: The patient was met in the preoperative holding area where the surgical site was marked and the consent form was signed.  The patient was then taken to the operating room and remained on the stretcher. A hand table was placed adjacent to the right upper extremity and locked into place.  All bony prominences were well padded.  A tourniquet was applied to the right upper arm.  Monitored anesthesia was induced.  The operative extremity was prepped and draped in the usual and sterile fashion.  A formal time-out was performed to confirm that this was the correct patient, surgery, side, and site.   Following formal timeout, the limb was gently exsanguinated with an Esmarch bandage and the tourniquet inflated to 250 mmHg.  I  began by designing a lazy S incision along the ulnar aspect of the thumb MCP joint.  The skin was sharply incised.  Blunt dissection was used to identify cutaneous nerve branches which were mobilized and protected throughout the procedure.  Small crossing vessels were coagulated as needed with bipolar electrocautery.  I then identified the adductor aponeurosis.  This was carefully divided longitudinally.  The ulnar collateral ligament was identified deep to this aponeurosis.  The origin of the ligament at the metacarpal head was intact without evidence of injury.  There was near full avulsion of the ligament from its insertion on the volar aspect of the proximal phalangeal base.  The footprint of ligament was debrided of soft tissue attachments using the 15 blade scalpel and a rondure.  I then placed the appropriate guidewire for an [employer] micro corkscrew anchor.  The anchor was then screwed into place.  The anchor was just distal to the subchondral bone.  There was no penetration of the articular surface.  Given the wide footprint of her ligament, I opted to place a second Nano corkscrew anchor dorsal to this first anchor.  The guidewire was appropriately positioned and then the anchor deployed.  The thumb was held in approximately 30 degrees of flexion at the MCP joint.  I then tied the ligament back to its insertion using the included 2-0 suture in horizontal mattress fashion.  The repair was quite secure and the thumb was stable to radial deviation.  It was then thoroughly irrigated.  The dorsal aspect of the collateral ligament was repaired back  to the joint capsule.  The adductor aponeurosis was repaired in a running fashion using a 4-0 Vicryl suture.  The wound was irrigated once more.  The skin was closed in a layered fashion using a 4-0 Monocryl suture followed by a 4-0 nylon suture in horizontal mattress fashion.  The wound was then cleaned and dressed with Xeroform, folded Kerlix, cast padding, and a  well-padded thumb spica splint was applied.  The patient was reversed from sedation.  All counts were correct x 2 at the end of the procedure.  The patient was then taken to the PACU in stable condition.   POSTOPERATIVE PLAN: He will be discharged to home with appropriate pain medication and discharge instructions.  I will see her back in 10 to 14 days for her first postop visit.  Waylan Rocher, MD 2:16 PM

## 2023-01-25 NOTE — Anesthesia Postprocedure Evaluation (Signed)
Anesthesia Post Note  Patient: [place] Ricotta  Procedure(s) Performed: THUMB ULNAR COLLATERAL LIGAMENT REPAIR (Right: Thumb)     Patient location during evaluation: PACU Anesthesia Type: Regional and MAC Level of consciousness: awake Pain management: pain level controlled Vital Signs Assessment: post-procedure vital signs reviewed and stable Respiratory status: spontaneous breathing, nonlabored ventilation and respiratory function stable Cardiovascular status: stable and blood pressure returned to baseline Postop Assessment: no apparent nausea or vomiting Anesthetic complications: no   No notable events documented.  Last Vitals:  Vitals:   01/25/23 1420 01/25/23 1430  BP: 113/88 (!) 135/95  Pulse: 77 69  Resp: 19 14  Temp: (!) 36.2 C   SpO2:  95%    Last Pain:  Vitals:   01/25/23 1430  TempSrc:   PainSc: 0-No pain                 Linton Rump
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
## 2023-01-25 NOTE — Discharge Instructions (Addendum)
Courtney Trevino, M.D. Hand Surgery  POST-OPERATIVE DISCHARGE INSTRUCTIONS   PRESCRIPTIONS: You may have been given a prescription to be taken as directed for post-operative pain control.  You may also take over the counter ibuprofen/aleve and tylenol for pain. Take this as directed on the packaging. Do not exceed 3000 mg tylenol/acetaminophen in 24 hours.  Ibuprofen 600-800 mg (3-4) tablets by mouth every 6 hours as needed for pain.  OR Aleve 2 tablets by mouth every 12 hours (twice daily) as needed for pain.  AND/OR Tylenol 1000 mg (2 tablets) every 8 hours as needed for pain.  Please use your pain medication carefully, as refills are limited and you may not be provided with one.  As stated above, please use over the counter pain medicine - it will also be helpful with decreasing your swelling.    ANESTHESIA: After your surgery, post-surgical discomfort or pain is likely. This discomfort can last several days to a few weeks. At certain times of the day your discomfort may be more intense.   Did you receive a nerve block?  A nerve block can provide pain relief for one hour to two days after your surgery. As long as the nerve block is working, you will experience little or no sensation in the area the surgeon operated on.  As the nerve block wears off, you will begin to experience pain or discomfort. It is very important that you begin taking your prescribed pain medication before the nerve block fully wears off. Treating your pain at the first sign of the block wearing off will ensure your pain is better controlled and more tolerable when full-sensation returns. Do not wait until the pain is intolerable, as the medicine will be less effective. It is better to treat pain in advance than to try and catch up.   General Anesthesia:  If you did not receive a nerve block during your surgery, you will need to start taking your pain medication shortly after your surgery and should continue  to do so as prescribed by your surgeon.     ICE AND ELEVATION: You may use ice for the first 48-72 hours, but it is not critical.   Motion of your fingers is very important to decrease the swelling.  Elevation, as much as possible for the next 48 hours, is critical for decreasing swelling as well as for pain relief. Elevation means when you are seated or lying down, you hand should be at or above your heart. When walking, the hand needs to be at or above the level of your elbow.  If the bandage gets too tight, it may need to be loosened. Please contact our office and we will instruct you in how to do this.    SURGICAL BANDAGES:  Keep your dressing and/or splint clean and dry at all times.  Do not remove until you are seen again in the office.  If careful, you may place a plastic bag over your bandage and tape the end to shower, but be careful, do not get your bandages wet.     HAND THERAPY:  You will need therapy starting 3-4 weeks after surgery.  We will discuss further at your first postop visit.    ACTIVITY AND WORK: You are encouraged to move any fingers which are not in the bandage.  Light use of the fingers is allowed to assist the other hand with daily hygiene and eating, but strong gripping or lifting is often uncomfortable and should  be avoided.  You might miss a variable period of time from work and hopefully this issue has been discussed prior to surgery. You may not do any heavy work with your affected hand for about 2 weeks.    [facility] Second Floor, [address] [place], [place] [address] [phone]    Post Anesthesia Home Care Instructions  Activity: Get plenty of rest for the remainder of the day. A responsible individual must stay with you for 24 hours following the procedure.  For the next 24 hours, DO NOT: -Drive a car -Advertising copywriter -Drink alcoholic beverages -Take any medication unless instructed by your physician -Make any legal  decisions or sign important papers.  Meals: Start with liquid foods such as gelatin or soup. Progress to regular foods as tolerated. Avoid greasy, spicy, heavy foods. If nausea and/or vomiting occur, drink only clear liquids until the nausea and/or vomiting subsides. Call your physician if vomiting continues.  Special Instructions/Symptoms: Your throat may feel dry or sore from the anesthesia or the breathing tube placed in your throat during surgery. If this causes discomfort, gargle with warm salt water. The discomfort should disappear within 24 hours.  If you had a scopolamine patch placed behind your ear for the management of post- operative nausea and/or vomiting:  1. The medication in the patch is effective for 72 hours, after which it should be removed.  Wrap patch in a tissue and discard in the trash. Wash hands thoroughly with soap and water. 2. You may remove the patch earlier than 72 hours if you experience unpleasant side effects which may include dry mouth, dizziness or visual disturbances. 3. Avoid touching the patch. Wash your hands with soap and water after contact with the patch.  Regional Anesthesia Blocks  1. You may not be able to move or feel the "blocked" extremity after a regional anesthetic block. This may last may last from 3-48 hours after placement, but it will go away. The length of time depends on the medication injected and your individual response to the medication. As the nerves start to wake up, you may experience tingling as the movement and feeling returns to your extremity. If the numbness and inability to move your extremity has not gone away after 48 hours, please call your surgeon.   2. The extremity that is blocked will need to be protected until the numbness is gone and the strength has returned. Because you cannot feel it, you will need to take extra care to avoid injury. Because it may be weak, you may have difficulty moving it or using it. You may not  know what position it is in without looking at it while the block is in effect.  3. For blocks in the legs and feet, returning to weight bearing and walking needs to be done carefully. You will need to wait until the numbness is entirely gone and the strength has returned. You should be able to move your leg and foot normally before you try and bear weight or walk. You will need someone to be with you when you first try to ensure you do not fall and possibly risk injury.  4. Bruising and tenderness at the needle site are common side effects and will resolve in a few days.  5. Persistent numbness or new problems with movement should be communicated to the surgeon or the [facility] [phone] [facility] [phone]).

## 2023-01-25 NOTE — Transfer of Care (Signed)
Immediate Anesthesia Transfer of Care Note  Patient: Courtney Trevino  Procedure(s) Performed: THUMB ULNAR COLLATERAL LIGAMENT REPAIR (Right: Thumb)  Patient Location: PACU  Anesthesia Type:MAC and Regional  Level of Consciousness: drowsy  Airway & Oxygen Therapy: Patient Spontanous Breathing  Post-op Assessment: Report given to RN and Post -op Vital signs reviewed and stable  Post vital signs: Reviewed and stable  Last Vitals:  Vitals Value Taken Time  BP 113/88 01/25/23 1423  Temp    Pulse 74 01/25/23 1424  Resp 19 01/25/23 1424  SpO2 96 % 01/25/23 1424  Vitals shown include unfiled device data.  Last Pain:  Vitals:   01/25/23 1026  TempSrc: Temporal  PainSc: 6          Complications: No notable events documented.

## 2023-01-25 NOTE — Interval H&P Note (Signed)
History and Physical Interval Note:  01/25/2023 11:10 AM  Courtney Trevino  has presented today for surgery, with the diagnosis of Right thumb ulnar collateral ligament rupture.  The various methods of treatment have been discussed with the patient and family. After consideration of risks, benefits and other options for treatment, the patient has consented to  Procedure(s) with comments: THUMB ULNAR COLLATERAL LIGAMENT REPAIR (Right) - regional as a surgical intervention.  The patient's history has been reviewed, patient examined, no change in status, stable for surgery.  I have reviewed the patient's chart and labs.  Questions were answered to the patient's satisfaction.     Adonis Yim

## 2023-01-26 ENCOUNTER — Encounter (HOSPITAL_BASED_OUTPATIENT_CLINIC_OR_DEPARTMENT_OTHER): Payer: Self-pay | Admitting: Orthopedic Surgery

## 2023-01-27 NOTE — Progress Notes (Unsigned)
Date: 02/01/2023 Appointment Start Time: 8:02am Duration: 59 minutes Provider: Helmut Muster, PsyD Type of Session: Individual Therapy  Location of Patient: Home (private location) Location of Provider: Provider's Home (private office) Type of Contact: Caregility video visit with audio  Session Content: Today's appointment was a telepsychological visit due to COVID-19. Courtney Trevino is aware it is her responsibility to secure confidentiality on her end of the session. She provided verbal consent to proceed with today's appointment. Prior to proceeding with today's appointment, Courtney Trevino's physical location at the time of this appointment was obtained as well a phone number she could be reached at in the event of technical difficulties. Courtney Trevino is aware of the limitations of teleheath visits and verbally consented to proceed.  This provider conducted a check-in. Courtney Trevino endorsed an overall improved mood. 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 Courtney Trevino discussed examples of various relational dynamics and strains, her and her friend's medical status, and self-care, and indicated how she is attempting to find a value congruent balance amongst the various situations. Through conversation, she shared how she has been consistently setting boundaries, being mindful of her emotional experiences, reminding herself of her values, engaging in acceptance-based techniques, prioritizing self-care and setting limits on roles she engages in, and learning from past mistakes, noting how these efforts have improved her overall wellbeing and better allowed herself to recover after a surgery.  She described the dropping of her boundaries, becoming "too involved" with other's problematic relational patterns with others, "isolating," and  reacting when experiencing strong emotions as warning signs for her wellbeing.She concluded with plans to continue the aforementioned issues. She denied having any other issues or concerns, and expressed appreciation for this writer's services.   Courtney Trevino did not endorse experiencing suicidal or homicidal ideation, plan, or intent since last meeting with this Clinical research associate and described future plans that include ongoing efforts to improve their wellbeing.   Courtney Trevino was receptive to today's appointment as evidenced by openness to sharing and responsiveness to feedback.  Interventions:  Provided empathic reflections and validation Reviewed content from the previous session Provided positive reinforcement Employed supportive psychotherapy interventions to facilitate reduced distress and to improve coping skills with identified stressors Engaged patient in goal setting Engaged patient in problem solving Psychoeducation provided regarding cognitive distortions  Psychoeducation provided regarding self-care Explored patient's values  Reviewed learned skills  DSM-5 Diagnosis(es):  F43.10 Posttraumtic Stress Disorder  Plan: [name] is currently scheduled for a follow-up appointment on 02/09/2023 at 8am via Caregility video visit with audio. The next session will focus on value congruent actions.   Treatment Goal & Progress:  1. Related Problem: Eliminate or reduce the negative impact trauma related symptoms have on  social, occupational, and family functioning. Description: Routinely use discussed therapeutic skills during stressors, relational issues,  and/or trauma reminders. Target Date: 2024-01-16 Frequency: Weekly Modality: individual Progress: 90%  2. Related Problem: Eliminate or reduce the negative impact trauma related symptoms have on  social, occupational, and family functioning. Description: Participate in Acceptance and Commitment Therapy (ACT) to reduce the impact  of the  traumatic event. Target Date: 2022-01-15 Frequency: Weekly Modality: individual Progress: 100% ? Planned Intervention: Use an ACT approach to PTSD to help the client experience and  accept the presence of troubling thoughts and images without being overly impacted by  them, and committing his/her time and efforts to activities  that are consistent with  identified, personally meaningful values. 3. Related Problem: Eliminate or reduce the negative impact trauma related symptoms have on social, occupational, and family functioning. Description: Learn and implement personal skills to manage challenging situations related to trauma. Target Date: 2022-01-15 Frequency: Weekly Modality: individual Progress: 100% 4. Related Problem: Eliminate or reduce the negative impact trauma related symptoms have on  social, occupational, and family functioning. Description: Participate in Cognitive Processing Therapy to process the trauma and reduce its  impact. Target Date: 2022-01-15 Frequency: Weekly Modality: individual Progress: 100% 5. Related Problem: Eliminate or reduce the negative impact trauma related symptoms have on  social, occupational, and family functioning. Description: Verbalize an understanding of the treatment rationale for PTSD. Target Date: 2022-01-15 Frequency: Weekly Modality: individual Progress: 100% 6. Related Problem: Eliminate or reduce the negative impact trauma related symptoms have on  social, occupational, and family functioning. Description: Learn and implement calming skills. Target Date: 2024-01-16 Frequency: Weekly Modality: individual Progress: 90% ? Planned Intervention: Teach the client calming skills (e.g., breathing retraining, relaxation,calming self-talk) to use in and between sessions when feeling overly distressed.   Courtney Trevino was agreeable with the treatment plan.              [name], PsyD

## 2023-02-01 ENCOUNTER — Ambulatory Visit: Payer: 59 | Admitting: Psychology

## 2023-02-01 DIAGNOSIS — F4310 Post-traumatic stress disorder, unspecified: Secondary | ICD-10-CM

## 2023-02-16 ENCOUNTER — Ambulatory Visit: Payer: 59 | Admitting: Psychology

## 2023-02-16 DIAGNOSIS — F4310 Post-traumatic stress disorder, unspecified: Secondary | ICD-10-CM | POA: Diagnosis not present

## 2023-02-16 NOTE — Progress Notes (Signed)
 Date: 02/16/2023 Appointment Start Time: 8:02am Duration: 58 minutes Provider: Frederic Fire, PsyD Type of Session: Individual Therapy  Location of Patient: Home (private location) Location of Provider: Provider's Home (private office) Type of Contact: Caregility video visit with audio  Session Content: Today's appointment was a telepsychological visit due to COVID-19. Courtney Trevino is aware it is her responsibility to secure confidentiality on her end of the session. She provided verbal consent to proceed with today's appointment. Prior to proceeding with today's appointment, Courtney Trevino's physical location at the time of this appointment was obtained as well a phone number she could be reached at in the event of technical difficulties. Courtney Trevino is aware of the limitations of teleheath visits and verbally consented to proceed.   This provider conducted a check-in. Courtney Trevino endorsed anxiety. This clinical research associate assisted Akasha in processing her emotional experiences, reflecting and reinforcing her efforts, problem solving stressors,  identifying her values and signs of growth, providing psychoeducation on acceptance cd communication,and formulating value congruent goals. Courtney Trevino discussed examples of various relational strains with family members that occurred over the past two weeks, noting how many are longstanding relational patterns. She further discussed how she was able to pause, consider what was causing the behaviors and how they are not personal, and then determining how she would like to respond or if it is worth responding to. She described how the situations caused anger but her efforts reduced psychological suffering and allowed her to enjoy New Years with a romantic partner. She noted how she has been increasingly emotionally vulnerable with her romantic partner, which she attributed to their understanding of each other's situations, communication of their emotional experiences, pausing before  responding to upsetting behaviors. She denied having any other issues or concerns.   Courtney Trevino did not endorse experiencing suicidal or homicidal ideation, plan, or intent since last meeting with this clinical research associate and described future plans that include ongoing efforts to improve their wellbeing.   Courtney Trevino was receptive to today's appointment as evidenced by openness to sharing and responsiveness to feedback.  Interventions:  Provided empathic reflections and validation Reviewed content from the previous session Provided positive reinforcement Employed supportive psychotherapy interventions to facilitate reduced distress and to improve coping skills with identified stressors Engaged patient in goal setting Engaged patient in problem solving Psychoeducation provided regarding cognitive distortions  Explored patient's values  Reviewed learned skills  DSM-5 Diagnosis(es):  F43.10 Posttraumtic Stress Disorder  Plan: Courtney Trevino is currently scheduled for a follow-up appointment on 03/02/2023 at 8am via Caregility video visit with audio. The next session will focus on value congruent actions.    Treatment Goal & Progress:  1. Related Problem: Eliminate or reduce the negative impact trauma related symptoms have on  social, occupational, and family functioning. Description: Routinely use discussed therapeutic skills during stressors, relational issues,  and/or trauma reminders. Target Date: 2024-01-16 Frequency: Weekly Modality: individual Progress: 90%  2. Related Problem: Eliminate or reduce the negative impact trauma related symptoms have on  social, occupational, and family functioning. Description: Participate in Acceptance and Commitment Therapy (ACT) to reduce the impact  of the traumatic event. Target Date: 2022-01-15 Frequency: Weekly Modality: individual Progress: 100% ? Planned Intervention: Use an ACT approach to PTSD to help the client experience and  accept the presence of  troubling thoughts and images without being overly impacted by  them, and committing his/her time and efforts to activities that are consistent with  identified, personally meaningful values. 3. Related Problem: Eliminate or reduce the negative impact trauma  related symptoms have on social, occupational, and family functioning. Description: Learn and implement personal skills to manage challenging situations related to trauma. Target Date: 2022-01-15 Frequency: Weekly Modality: individual Progress: 100% 4. Related Problem: Eliminate or reduce the negative impact trauma related symptoms have on  social, occupational, and family functioning. Description: Participate in Cognitive Processing Therapy to process the trauma and reduce its  impact. Target Date: 2022-01-15 Frequency: Weekly Modality: individual Progress: 100% 5. Related Problem: Eliminate or reduce the negative impact trauma related symptoms have on  social, occupational, and family functioning. Description: Verbalize an understanding of the treatment rationale for PTSD. Target Date: 2022-01-15 Frequency: Weekly Modality: individual Progress: 100% 6. Related Problem: Eliminate or reduce the negative impact trauma related symptoms have on  social, occupational, and family functioning. Description: Learn and implement calming skills. Target Date: 2024-01-16 Frequency: Weekly Modality: individual Progress: 90% ? Planned Intervention: Teach the client calming skills (e.g., breathing retraining, relaxation,calming self-talk) to use in and between sessions when feeling overly distressed.   Courtney Trevino was agreeable with the treatment plan.               Frederic ONEIDA Fire, PsyD

## 2023-03-01 ENCOUNTER — Ambulatory Visit: Payer: 59 | Admitting: Psychology

## 2023-03-02 ENCOUNTER — Ambulatory Visit: Payer: 59 | Admitting: Psychology

## 2023-03-02 DIAGNOSIS — F4310 Post-traumatic stress disorder, unspecified: Secondary | ICD-10-CM | POA: Diagnosis not present

## 2023-03-02 NOTE — Progress Notes (Signed)
Date: 03/02/2023 Appointment Start Time: 8:03am  Duration: 57 minutes Provider: Helmut Muster, PsyD Type of Session: Individual Therapy  Location of Patient: Home (private location) Location of Provider: Provider's Home (private office) Type of Contact: Caregility video visit with audio  Session Content: Today's appointment was a telepsychological visit due to COVID-19. Zdenka is aware it is her responsibility to secure confidentiality on her end of the session. She provided verbal consent to proceed with today's appointment. Prior to proceeding with today's appointment, Waverley's physical location at the time of this appointment was obtained as well a phone number she could be reached at in the event of technical difficulties. Miarose is aware of the limitations of teleheath visits and verbally consented to proceed.  This provider conducted a check-in. Ms. Odgers endorsed a low mood. This Clinical research associate assisted Cailah in processing her emotional experiences, reflecting and reinforcing her efforts, problem solving stressors, identifying her values, and formulating value congruent plans. Ms. Forino discussed how ongoing medical issues and obstacles to care and ongoing relational strains with various others have contributed to a low mood and frustration. Through conversation, she shared examples of how she is increasingly setting boundaries with others that are engaging in harmful behaviors, reminding herself that her behaviors are not personal, prioritizing self-care, and disengaging from maladaptive communication patterns, noting how this has also assisted her in identifying and engaging with others that are supportive and have similar values and priorities to her. She stated plans to celebrate her birthday with her romantic partner and demonstrated an improved mood at the end of the appointment. She denied having any other issues or concerns.   Ms. Custis did not endorse experiencing suicidal or  homicidal ideation, plan, or intent since last meeting with this Clinical research associate and described future plans that include ongoing efforts to improve their wellbeing.   Lolisa was receptive to today's appointment as evidenced by openness to sharing and responsiveness to feedback.  Interventions:  Provided empathic reflections and validation Reviewed content from the previous session Provided positive reinforcement Employed supportive psychotherapy interventions to facilitate reduced distress and to improve coping skills with identified stressors Engaged patient in goal setting Engaged patient in problem solving Explored patient's values  Reviewed learned skills  DSM-5 Diagnosis(es):  F43.10 Posttraumtic Stress Disorder  Plan: Samarra is currently scheduled for a follow-up appointment on 03/15/2023 at 8am via Caregility video visit with audio. The next session will focus on value congruent actions.    Treatment Goal & Progress:  1. Related Problem: Eliminate or reduce the negative impact trauma related symptoms have on  social, occupational, and family functioning. Description: Routinely use discussed therapeutic skills during stressors, relational issues,  and/or trauma reminders. Target Date: 2024-01-16 Frequency: Weekly Modality: individual Progress: 90%  2. Related Problem: Eliminate or reduce the negative impact trauma related symptoms have on  social, occupational, and family functioning. Description: Participate in Acceptance and Commitment Therapy (ACT) to reduce the impact  of the traumatic event. Target Date: 2022-01-15 Frequency: Weekly Modality: individual Progress: 100% ? Planned Intervention: Use an ACT approach to PTSD to help the client experience and  accept the presence of troubling thoughts and images without being overly impacted by  them, and committing his/her time and efforts to activities that are consistent with  identified, personally meaningful values. 3.  Related Problem: Eliminate or reduce the negative impact trauma related symptoms have on social, occupational, and family functioning. Description: Learn and implement personal skills to manage challenging situations related to trauma. Target Date: 2022-01-15 Frequency:  Weekly Modality: individual Progress: 100% 4. Related Problem: Eliminate or reduce the negative impact trauma related symptoms have on  social, occupational, and family functioning. Description: Participate in Cognitive Processing Therapy to process the trauma and reduce its  impact. Target Date: 2022-01-15 Frequency: Weekly Modality: individual Progress: 100% 5. Related Problem: Eliminate or reduce the negative impact trauma related symptoms have on  social, occupational, and family functioning. Description: Verbalize an understanding of the treatment rationale for PTSD. Target Date: 2022-01-15 Frequency: Weekly Modality: individual Progress: 100% 6. Related Problem: Eliminate or reduce the negative impact trauma related symptoms have on  social, occupational, and family functioning. Description: Learn and implement calming skills. Target Date: 2024-01-16 Frequency: Weekly Modality: individual Progress: 90% ? Planned Intervention: Teach the client calming skills (e.g., breathing retraining, relaxation,calming self-talk) to use in and between sessions when feeling overly distressed.   Ms. Buchannan was agreeable with the treatment plan.              [name], PsyD

## 2023-03-15 ENCOUNTER — Ambulatory Visit: Payer: 59 | Admitting: Psychology

## 2023-03-15 DIAGNOSIS — F4310 Post-traumatic stress disorder, unspecified: Secondary | ICD-10-CM

## 2023-03-15 NOTE — Progress Notes (Signed)
Date: 03/15/2023 Appointment Start Time: 8:06am (pt arrived late to the appointment) Duration: 56 minutes Provider: Helmut Muster, PsyD Type of Session: Individual Therapy  Location of Patient: Home (private location) Location of Provider: Provider's Home (private office) Type of Contact: Caregility video visit with audio  Session Content: Today's appointment was a telepsychological visit due to COVID-19. Keenya is aware it is her responsibility to secure confidentiality on her end of the session. She provided verbal consent to proceed with today's appointment. Prior to proceeding with today's appointment, Akita's physical location at the time of this appointment was obtained as well a phone number she could be reached at in the event of technical difficulties. Marquisa is aware of the limitations of teleheath visits and verbally consented to proceed.  This provider conducted a check-in. Ms. Dunphy endorsed a low mood. This Clinical research associate assisted Raynisha in processing her emotional experiences, reflecting and reinforcing her efforts, problem solving stressors, and formulating value congruent plans.  Ms. Brosious discussed examples of  how various others have engaged in inappropriate and/or perceived maladaptive actions, although she noted how she has been considering what is within and outside her control and responding accordingly. 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 She further shared how a romantic relationship continues to go well, noting how they have both prioritized communication and their relationship. She denied having any other issues or concerns.   Ms. Christy did not endorse experiencing suicidal or homicidal ideation,  plan, or intent since last meeting with this Clinical research associate and described future plans that include ongoing efforts to improve their wellbeing.   Beckey was receptive to today's appointment as evidenced by openness to sharing and responsiveness to feedback.  Interventions:  Provided empathic reflections and validation Reviewed content from the previous session Provided positive reinforcement Employed supportive psychotherapy interventions to facilitate reduced distress and to improve coping skills with identified stressors Engaged patient in goal setting Engaged patient in problem solving Psychoeducation provided regarding cognitive distortions  Explored patient's values  Reviewed learned skills  DSM-5 Diagnosis(es):  F43.10 Posttraumtic Stress Disorder  Plan: Gessica is currently scheduled for a follow-up appointment on 03/29/2023 at 8am via Caregility video visit with audio. The next session will focus on value congruent actions.    Treatment Goal & Progress:  1. Related Problem: Eliminate or reduce the negative impact trauma related symptoms have on  social, occupational, and family functioning. Description: Routinely use discussed therapeutic skills during stressors, relational issues,  and/or trauma reminders. Target Date: 2024-01-16 Frequency: Weekly Modality: individual Progress: 90%  2. Related Problem: Eliminate or reduce the negative impact trauma related symptoms have on  social, occupational, and family functioning. Description: Participate in Acceptance and Commitment Therapy (ACT) to reduce the impact  of the traumatic event. Target Date: 2022-01-15 Frequency: Weekly Modality: individual Progress: 100% ? Planned Intervention: Use an ACT approach to PTSD to help the client experience and  accept the presence of troubling thoughts and images without being overly impacted by  them, and committing his/her time and efforts to activities that are consistent with   identified, personally meaningful values. 3. Related Problem: Eliminate or reduce the negative impact trauma related symptoms have on social, occupational, and family functioning. Description: Learn  and implement personal skills to manage challenging situations related to trauma. Target Date: 2022-01-15 Frequency: Weekly Modality: individual Progress: 100% 4. Related Problem: Eliminate or reduce the negative impact trauma related symptoms have on  social, occupational, and family functioning. Description: Participate in Cognitive Processing Therapy to process the trauma and reduce its  impact. Target Date: 2022-01-15 Frequency: Weekly Modality: individual Progress: 100% 5. Related Problem: Eliminate or reduce the negative impact trauma related symptoms have on  social, occupational, and family functioning. Description: Verbalize an understanding of the treatment rationale for PTSD. Target Date: 2022-01-15 Frequency: Weekly Modality: individual Progress: 100% 6. Related Problem: Eliminate or reduce the negative impact trauma related symptoms have on  social, occupational, and family functioning. Description: Learn and implement calming skills. Target Date: 2024-01-16 Frequency: Weekly Modality: individual Progress: 90% ? Planned Intervention: Teach the client calming skills (e.g., breathing retraining, relaxation,calming self-talk) to use in and between sessions when feeling overly distressed.   Ms. Brockman was agreeable with the treatment plan.               [name], PsyD

## 2023-03-29 ENCOUNTER — Ambulatory Visit: Payer: 59 | Admitting: Psychology

## 2023-03-29 DIAGNOSIS — F4310 Post-traumatic stress disorder, unspecified: Secondary | ICD-10-CM

## 2023-03-29 NOTE — Progress Notes (Signed)
Date: 03/29/2023 Appointment Start Time: 8am Duration: 62 minutes Provider: Helmut Muster, PsyD Type of Session: Individual Therapy  Location of Patient: Home (private location) Location of Provider: Provider's Home (private office) Type of Contact: Caregility video visit with audio  Session Content: Today's appointment was a telepsychological visit due to COVID-19. Abisai is aware it is her responsibility to secure confidentiality on her end of the session. She provided verbal consent to proceed with today's appointment. Prior to proceeding with today's appointment, Gladis's physical location at the time of this appointment was obtained as well a phone number she could be reached at in the event of technical difficulties. Tarika is aware of the limitations of teleheath visits and verbally consented to proceed.  This provider conducted a check-in. Ms. Nichelson endorsed anxiety. This Clinical research associate assisted Shirell in processing her emotional experiences, reflecting and reinforcing her efforts, identifying priorities, problem solving stressors, and formulating value congruent goals. Ms. Win discussed how her new PCP has v been communicative and largely validated her concerns about her past medical care, noting how this was helpful. She further discussed how financial strains and the increased cost of groceries is negatively impacting her emotional wellbeing and contributing to medical complications, having concerns about her daughter's wellbeing and ambivalence on how to best assist her, and her and her brother not currently talking. Througho conversation, she noted how her efforts to acknowledge and processing painful truths has led to her responding differently to various stressors and family dynamics and assisted in her efforts to pursue different mewdical providers. She concluded with plans to be mindful of her emotional experiences and warning signs for her emotional wellbeing as well as to pause  before responding if she notices an emotional warning sign is present, prioritizing tasks, and spending time with supportive others. She denied having any other issues or concerns.   Ms. Trembley did not endorse experiencing suicidal or homicidal ideation, plan, or intent since last meeting with this Clinical research associate and described future plans that include ongoing efforts to improve her wellbeing.   Treasa was receptive to today's appointment as evidenced by openness to sharing and responsiveness to feedback.  Interventions:  Provided empathic reflections and validation Reviewed content from the previous session Provided positive reinforcement Employed supportive psychotherapy interventions to facilitate reduced distress and to improve coping skills with identified stressors Engaged patient in goal setting Engaged patient in problem solving Psychoeducation provided regarding cognitive distortions  Psychoeducation provided regarding self-care Explored patient's values  Reviewed learned skills  DSM-5 Diagnosis(es):  F43.10 Posttraumtic Stress Disorder  Plan: Cherice is currently scheduled for a follow-up appointment on 04/12/2023 at 8am via Caregility video visit with audio. The next session will focus on value congruent actions.   Treatment Goal & Progress:  1. Related Problem: Eliminate or reduce the negative impact trauma related symptoms have on  social, occupational, and family functioning. Description: Routinely use discussed therapeutic skills during stressors, relational issues,  and/or trauma reminders. Target Date: 2024-01-16 Frequency: Weekly Modality: individual Progress: 90%  2. Related Problem: Eliminate or reduce the negative impact trauma related symptoms have on  social, occupational, and family functioning. Description: Participate in Acceptance and Commitment Therapy (ACT) to reduce the impact  of the traumatic event. Target Date: 2022-01-15 Frequency: Weekly Modality:  individual Progress: 100% ? Planned Intervention: Use an ACT approach to PTSD to help the client experience and  accept the presence of troubling thoughts and images without being overly impacted by  them, and committing his/her time and efforts to activities  that are consistent with  identified, personally meaningful values. 3. Related Problem: Eliminate or reduce the negative impact trauma related symptoms have on social, occupational, and family functioning. Description: Learn and implement personal skills to manage challenging situations related to trauma. Target Date: 2022-01-15 Frequency: Weekly Modality: individual Progress: 100% 4. Related Problem: Eliminate or reduce the negative impact trauma related symptoms have on  social, occupational, and family functioning. Description: Participate in Cognitive Processing Therapy to process the trauma and reduce its  impact. Target Date: 2022-01-15 Frequency: Weekly Modality: individual Progress: 100% 5. Related Problem: Eliminate or reduce the negative impact trauma related symptoms have on  social, occupational, and family functioning. Description: Verbalize an understanding of the treatment rationale for PTSD. Target Date: 2022-01-15 Frequency: Weekly Modality: individual Progress: 100% 6. Related Problem: Eliminate or reduce the negative impact trauma related symptoms have on  social, occupational, and family functioning. Description: Learn and implement calming skills. Target Date: 2024-01-16 Frequency: Weekly Modality: individual Progress: 90% ? Planned Intervention: Teach the client calming skills (e.g., breathing retraining, relaxation,calming self-talk) to use in and between sessions when feeling overly distressed.   Ms. Chandra was agreeable with the treatment plan.                [name], PsyD

## 2023-04-12 ENCOUNTER — Ambulatory Visit: Payer: 59 | Admitting: Psychology

## 2023-04-13 NOTE — Progress Notes (Unsigned)
 Date: 04/14/2023 Appointment Start Time: 9:02am Duration: 58 minutes Provider: Helmut Muster, PsyD Type of Session: Individual Therapy  Location of Patient: Home (private location) Location of Provider: Provider's Home (private office) Type of Contact: Caregility video visit with audio  Session Content: Today's appointment was a telepsychological visit due to COVID-19. Mahagony is aware it is her responsibility to secure confidentiality on her end of the session. She provided verbal consent to proceed with today's appointment. Prior to proceeding with today's appointment, Maelyn's physical location at the time of this appointment was obtained as well a phone number she could be reached at in the event of technical difficulties. Kriti is aware of the limitations of teleheath visits and verbally consented to proceed.  This provider conducted a check-in. Ms. Mancusi reported a romantic relationship ended. This Clinical research associate assisted Nusaiba in processing her emotional experiences, reflecting and reinforcing her efforts, problem solving stressors, identifying values and signs of growth, and formulating value congruent plans. Ms. Labrecque discussed examples of various concerning behaviors a romantic partner was engaging in, noting how she took time to pause and reflect prior to responding to them, considering her values and roles she wants to engage in,  and keeping to set boundaries. She further discussed how this has led to value congruent actions, and feeling confident in her ability to continue engaging in the aforementioned efforts despite the relational strains it sometimes causes. She noted how despite her upset with the romantic partner, she also sees signs of growth in them and is hopeful they will continue to hear her messages and recognize they come from a place of care. She also shared how she has been addressing various stressors through defusing from areas outside her control, acceptance of the  situation, prioritizing self-care, reaching out to supportive others, and making plans and back-up plans for uncertain things. She denied having any other issues or concerns, and expressed appreciation for this writer's services.   Ms. Norenberg did not endorse experiencing suicidal or homicidal ideation, plan, or intent since last meeting with this Clinical research associate and described future plans that include ongoing efforts to improve their wellbeing.   Andrian was receptive to today's appointment as evidenced by openness to sharing and responsiveness to feedback.  Interventions:  Provided empathic reflections and validation Reviewed content from the previous session Provided positive reinforcement Employed supportive psychotherapy interventions to facilitate reduced distress and to improve coping skills with identified stressors Engaged patient in goal setting Engaged patient in problem solving Psychoeducation provided regarding cognitive distortions  Psychoeducation provided regarding all-or-nothing thinking Psychoeducation provided regarding self-care Explored patient's values  Reviewed learned skills  DSM-5 Diagnosis(es):  F43.10 Posttraumtic Stress Disorder  Plan: Nyaira is currently scheduled for a follow-up appointment on 04/26/2023 at 8am via Caregility video visit with audio. The next session will focus on value congruent actions.    Treatment Goal & Progress:  1. Related Problem: Eliminate or reduce the negative impact trauma related symptoms have on  social, occupational, and family functioning. Description: Routinely use discussed therapeutic skills during stressors, relational issues,  and/or trauma reminders. Target Date: 2024-01-16 Frequency: Weekly Modality: individual Progress: 90%  2. Related Problem: Eliminate or reduce the negative impact trauma related symptoms have on  social, occupational, and family functioning. Description: Participate in Acceptance and Commitment  Therapy (ACT) to reduce the impact  of the traumatic event. Target Date: 2022-01-15 Frequency: Weekly Modality: individual Progress: 100% ? Planned Intervention: Use an ACT approach to PTSD to help the client experience and  accept the  presence of troubling thoughts and images without being overly impacted by  them, and committing his/her time and efforts to activities that are consistent with  identified, personally meaningful values. 3. Related Problem: Eliminate or reduce the negative impact trauma related symptoms have on social, occupational, and family functioning. Description: Learn and implement personal skills to manage challenging situations related to trauma. Target Date: 2022-01-15 Frequency: Weekly Modality: individual Progress: 100% 4. Related Problem: Eliminate or reduce the negative impact trauma related symptoms have on  social, occupational, and family functioning. Description: Participate in Cognitive Processing Therapy to process the trauma and reduce its  impact. Target Date: 2022-01-15 Frequency: Weekly Modality: individual Progress: 100% 5. Related Problem: Eliminate or reduce the negative impact trauma related symptoms have on  social, occupational, and family functioning. Description: Verbalize an understanding of the treatment rationale for PTSD. Target Date: 2022-01-15 Frequency: Weekly Modality: individual Progress: 100% 6. Related Problem: Eliminate or reduce the negative impact trauma related symptoms have on  social, occupational, and family functioning. Description: Learn and implement calming skills. Target Date: 2024-01-16 Frequency: Weekly Modality: individual Progress: 90% ? Planned Intervention: Teach the client calming skills (e.g., breathing retraining, relaxation,calming self-talk) to use in and between sessions when feeling overly distressed.   Ms. Dardis was agreeable with the treatment plan.              [name],  PsyD

## 2023-04-14 ENCOUNTER — Ambulatory Visit (INDEPENDENT_AMBULATORY_CARE_PROVIDER_SITE_OTHER): Payer: 59 | Admitting: Psychology

## 2023-04-14 DIAGNOSIS — F4310 Post-traumatic stress disorder, unspecified: Secondary | ICD-10-CM

## 2023-04-26 ENCOUNTER — Ambulatory Visit: Payer: 59 | Admitting: Psychology

## 2023-04-26 DIAGNOSIS — F4310 Post-traumatic stress disorder, unspecified: Secondary | ICD-10-CM

## 2023-04-26 NOTE — Progress Notes (Signed)
 Date: 04/26/2023 Appointment Start Time: 8:02am Duration: 60 minutes Provider: Helmut Muster, PsyD Type of Session: Individual Therapy  Location of Patient: Home (private location) Location of Provider: Provider's Home (private office) Type of Contact: Caregility video visit with audio  Session Content: Today's appointment was a telepsychological visit due to COVID-19. Courtney Trevino is aware it is her responsibility to secure confidentiality on her end of the session. She provided verbal consent to proceed with today's appointment. Prior to proceeding with today's appointment, Courtney Trevino's physical location at the time of this appointment was obtained as well a phone number she could be reached at in the event of technical difficulties. Courtney Trevino is aware of the limitations of teleheath visits and verbally consented to proceed.  This provider conducted a check-in. Courtney Trevino endorsed concerns about a friend. This Clinical research associate assisted Quaneshia in processing her emotional experiences, reflecting and reinforcing her efforts and signs of personal growth, problem solving stressors, and determining value congruent plans.  Courtney Trevino discussed how she has been increasingly supporting a friend despite the stress involved in doing so, noting how her efforts are value congruent, based in empathy, and how she and the friend mutually assist each other in this way. She further discussed how various family members have been engaging in efforts to resolve relational strains with her, and indicated how her staying consistent with her efforts to address them, expressing her concerns and emotional experiences, and use of an accepting attitude have been helpful.  She expressed concerns about her daughter's boyfriend, noting how various behaviors have been "red flags" for her. She further expressed how she has been notifying her daughter about these concerns and providing recommendations, and that her daughter is increasingly open to the  information. She concluded with plans to visit her daughter to further assess the situation and to provide support. She denied having any other issues or concerns.   Courtney Trevino did not endorse experiencing suicidal or homicidal ideation, plan, or intent since last meeting with this Clinical research associate and described future plans that include ongoing efforts to improve their wellbeing.   Courtney Trevino was receptive to today's appointment as evidenced by openness to sharing and responsiveness to feedback.  Interventions:  Provided empathic reflections and validation Reviewed content from the previous session Provided positive reinforcement Employed supportive psychotherapy interventions to facilitate reduced distress and to improve coping skills with identified stressors Engaged patient in goal setting Engaged patient in problem solving Psychoeducation provided regarding cognitive distortions  Psychoeducation provided regarding all-or-nothing thinking Psychoeducation provided regarding self-care Explored patient's values  Reviewed learned skills  DSM-5 Diagnosis(es):  F43.10 Posttraumtic Stress Disorder  Plan: Courtney Trevino is currently scheduled for a follow-up appointment on 05/10/2023 at 8am via Caregility video visit with audio. The next session will focus on value congruent actions.    Treatment Goal & Progress:  1. Related Problem: Eliminate or reduce the negative impact trauma related symptoms have on  social, occupational, and family functioning. Description: Routinely use discussed therapeutic skills during stressors, relational issues,  and/or trauma reminders. Target Date: 2024-01-16 Frequency: Weekly Modality: individual Progress: 90%  2. Related Problem: Eliminate or reduce the negative impact trauma related symptoms have on  social, occupational, and family functioning. Description: Participate in Acceptance and Commitment Therapy (ACT) to reduce the impact  of the traumatic event. Target  Date: 2022-01-15 Frequency: Weekly Modality: individual Progress: 100% ? Planned Intervention: Use an ACT approach to PTSD to help the client experience and  accept the presence of troubling thoughts and images without being overly  impacted by  them, and committing his/her time and efforts to activities that are consistent with  identified, personally meaningful values. 3. Related Problem: Eliminate or reduce the negative impact trauma related symptoms have on social, occupational, and family functioning. Description: Learn and implement personal skills to manage challenging situations related to trauma. Target Date: 2022-01-15 Frequency: Weekly Modality: individual Progress: 100% 4. Related Problem: Eliminate or reduce the negative impact trauma related symptoms have on  social, occupational, and family functioning. Description: Participate in Cognitive Processing Therapy to process the trauma and reduce its  impact. Target Date: 2022-01-15 Frequency: Weekly Modality: individual Progress: 100% 5. Related Problem: Eliminate or reduce the negative impact trauma related symptoms have on  social, occupational, and family functioning. Description: Verbalize an understanding of the treatment rationale for PTSD. Target Date: 2022-01-15 Frequency: Weekly Modality: individual Progress: 100% 6. Related Problem: Eliminate or reduce the negative impact trauma related symptoms have on  social, occupational, and family functioning. Description: Learn and implement calming skills. Target Date: 2024-01-16 Frequency: Weekly Modality: individual Progress: 90% ? Planned Intervention: Teach the client calming skills (e.g., breathing retraining, relaxation,calming self-talk) to use in and between sessions when feeling overly distressed.   Courtney Trevino was agreeable with the treatment plan.                Courtney Gouge, PsyD

## 2023-05-10 ENCOUNTER — Ambulatory Visit (INDEPENDENT_AMBULATORY_CARE_PROVIDER_SITE_OTHER): Payer: 59 | Admitting: Psychology

## 2023-05-10 DIAGNOSIS — F4310 Post-traumatic stress disorder, unspecified: Secondary | ICD-10-CM | POA: Diagnosis not present

## 2023-05-10 NOTE — Progress Notes (Signed)
 Date: 05/10/2023 Appointment Start Time: 8:04am Duration: 60 minutes Provider: Helmut Muster, PsyD Type of Session: Individual Therapy  Location of Patient: Home (private location) Location of Provider: Provider's Home (private office) Type of Contact: Caregility video visit with audio  Session Content: Today's appointment was a telepsychological visit due to COVID-19. Courtney Trevino is aware it is her responsibility to secure confidentiality on her end of the session. She provided verbal consent to proceed with today's appointment. Prior to proceeding with today's appointment, Courtney Trevino's physical location at the time of this appointment was obtained as well a phone number she could be reached at in the event of technical difficulties. Courtney Trevino is aware of the limitations of teleheath visits and verbally consented to proceed.  This provider conducted a check-in. Courtney Trevino endorsed ongoing value congruent actions.  This Company secretary in processing her emotional experiences, reflecting and reinforcing her efforts and ongoing signs of personal growth, identifying her values, provided psychoeducation on values and communication, problem solving stressors, and formulating value congruent plans. Courtney Trevino discussed examples of various stressors and relational strains with others, although noted how in each example she paused to consider if warning signs for maladaptive communication and her wellbeing were present as well as how she would like to respond. She further noted how in doing this, others have began changing their responses to her, she has reduced psychological suffering, set limits, and has increasingly engaged in value congruent actions. She also discussed how she has been increasingly utilizing spiritual practices in a value congruent manner, and how she has been considering the various religious messages she has received and determining what best fits her and are most helpful. She concluded  with plans to continue her efforts to reduce her use of cigarettes, expressing how she is proud of herself for her current success in doing so and how it has already improved her physical wellbeing. She denied having any other issues or concerns, and expressed appreciation for this writer's services.   Courtney Trevino did not endorse experiencing suicidal or homicidal ideation, plan, or intent since last meeting with this Clinical research associate and described future plans that include ongoing efforts to improve their wellbeing.   Courtney Trevino was receptive to today's appointment as evidenced by openness to sharing and responsiveness to feedback.  Interventions:  Provided empathic reflections and validation Reviewed content from the previous session Provided positive reinforcement Employed supportive psychotherapy interventions to facilitate reduced distress and to improve coping skills with identified stressors Engaged patient in goal setting Engaged patient in problem solving Psychoeducation provided regarding cognitive distortions  Psychoeducation provided regarding all-or-nothing thinking Explored patient's values  Reviewed learned skills  DSM-5 Diagnosis(es):  F43.10 Posttraumtic Stress Disorder  Plan: Courtney Trevino is currently scheduled for a follow-up appointment on 05/24/2023 at 8am via Caregility video visit with audio. The next session will focus on value congruent actions.    Treatment Goal & Progress:  1. Related Problem: Eliminate or reduce the negative impact trauma related symptoms have on  social, occupational, and family functioning. Description: Routinely use discussed therapeutic skills during stressors, relational issues,  and/or trauma reminders. Target Date: 2024-01-16 Frequency: Weekly Modality: individual Progress: 90%  2. Related Problem: Eliminate or reduce the negative impact trauma related symptoms have on  social, occupational, and family functioning. Description: Participate in  Acceptance and Commitment Therapy (ACT) to reduce the impact  of the traumatic event. Target Date: 2022-01-15 Frequency: Weekly Modality: individual Progress: 100% ? Planned Intervention: Use an ACT approach to PTSD to help the client  experience and  accept the presence of troubling thoughts and images without being overly impacted by  them, and committing his/her time and efforts to activities that are consistent with  identified, personally meaningful values. 3. Related Problem: Eliminate or reduce the negative impact trauma related symptoms have on social, occupational, and family functioning. Description: Learn and implement personal skills to manage challenging situations related to trauma. Target Date: 2022-01-15 Frequency: Weekly Modality: individual Progress: 100% 4. Related Problem: Eliminate or reduce the negative impact trauma related symptoms have on  social, occupational, and family functioning. Description: Participate in Cognitive Processing Therapy to process the trauma and reduce its  impact. Target Date: 2022-01-15 Frequency: Weekly Modality: individual Progress: 100% 5. Related Problem: Eliminate or reduce the negative impact trauma related symptoms have on  social, occupational, and family functioning. Description: Verbalize an understanding of the treatment rationale for PTSD. Target Date: 2022-01-15 Frequency: Weekly Modality: individual Progress: 100% 6. Related Problem: Eliminate or reduce the negative impact trauma related symptoms have on  social, occupational, and family functioning. Description: Learn and implement calming skills. Target Date: 2024-01-16 Frequency: Weekly Modality: individual Progress: 90% ? Planned Intervention: Teach the client calming skills (e.g., breathing retraining, relaxation,calming self-talk) to use in and between sessions when feeling overly distressed.   Courtney Trevino was agreeable with the treatment plan.                [name], PsyD

## 2023-05-24 ENCOUNTER — Ambulatory Visit: Payer: 59 | Admitting: Psychology

## 2023-05-24 DIAGNOSIS — F4310 Post-traumatic stress disorder, unspecified: Secondary | ICD-10-CM | POA: Diagnosis not present

## 2023-05-24 NOTE — Progress Notes (Signed)
 Date: 05/24/2023 Appointment Start Time: 8:01am Duration: 60 minutes Provider: Helmut Muster, PsyD Type of Session: Individual Therapy  Location of Patient: Home (private location) Location of Provider: Provider's Home (private office) Type of Contact: Caregility video visit with audio  Session Content: Today's appointment was a telepsychological visit due to COVID-19. Courtney Trevino is aware it is her responsibility to secure confidentiality on her end of the session. She provided verbal consent to proceed with today's appointment. Prior to proceeding with today's appointment, Courtney Trevino's physical location at the time of this appointment was obtained as well a phone number she could be reached at in the event of technical difficulties. Courtney Trevino is aware of the limitations of teleheath visits and verbally consented to proceed.  This provider conducted a check-in. Courtney Trevino endorsed a depressed mood. This Clinical research associate assisted Courtney Trevino in processing her emotional experiences, reflecting and reinforcing her efforts, identifying priorities,  challenging various negative thoughts, problem solving stressors, and formulating plans. Courtney Trevino discussed how recent medical issues have been stressful and that various others declining to assist or not checking on her have been emotionally painful. She described how she has been coping through allowing herself to cry, expressing gratitude, spending time with a supportive person, and her spirituality, although noted how self-critical thoughts (e.g., "You know them, so you shouldn't be upset [about their lack of support]") tend to contribute to psychological suffering. Through conversation, she identified priorities of continuing her medical care, resting, returning home and notifying her social worker she has done so, filing a formal complaint against her home aide's agency given her concerns that an aide played a role in her having an E.coli infection, engaging in spiritual  practices, and keeping set boundaries with various others that she  described as being prone to not taking responsibility for their actions. She demonstrated an improved mood at the end of the appointment, and denied having any other issues or concerns.   Courtney Trevino did not endorse experiencing suicidal or homicidal ideation, plan, or intent since last meeting with this Clinical research associate and described future plans that include ongoing efforts to improve her wellbeing.   Courtney Trevino was receptive to today's appointment as evidenced by openness to sharing and responsiveness to feedback.  Interventions:  Provided empathic reflections and validation Reviewed content from the previous session Provided positive reinforcement Employed supportive psychotherapy interventions to facilitate reduced distress and to improve coping skills with identified stressors Engaged patient in goal setting Engaged patient in problem solving Psychoeducation provided regarding cognitive distortions  Psychoeducation provided regarding self-care Explored patient's values  Reviewed learned skills  DSM-5 Diagnosis(es):  F43.10 Posttraumtic Stress Disorder  Plan: Courtney Trevino is currently scheduled for a follow-up appointment on 06/07/2023 at 8am via Caregility video visit with audio. The next session will focus on value congruent actions.    Treatment Goal & Progress:  1. Related Problem: Eliminate or reduce the negative impact trauma related symptoms have on  social, occupational, and family functioning. Description: Routinely use discussed therapeutic skills during stressors, relational issues,  and/or trauma reminders. Target Date: 2024-01-16 Frequency: Weekly Modality: individual Progress: 90%  2. Related Problem: Eliminate or reduce the negative impact trauma related symptoms have on  social, occupational, and family functioning. Description: Participate in Acceptance and Commitment Therapy (ACT) to reduce the impact  of the  traumatic event. Target Date: 2022-01-15 Frequency: Weekly Modality: individual Progress: 100% ? Planned Intervention: Use an ACT approach to PTSD to help the client experience and  accept the presence of troubling thoughts and images without  being overly impacted by  them, and committing his/her time and efforts to activities that are consistent with  identified, personally meaningful values. 3. Related Problem: Eliminate or reduce the negative impact trauma related symptoms have on social, occupational, and family functioning. Description: Learn and implement personal skills to manage challenging situations related to trauma. Target Date: 2022-01-15 Frequency: Weekly Modality: individual Progress: 100% 4. Related Problem: Eliminate or reduce the negative impact trauma related symptoms have on  social, occupational, and family functioning. Description: Participate in Cognitive Processing Therapy to process the trauma and reduce its  impact. Target Date: 2022-01-15 Frequency: Weekly Modality: individual Progress: 100% 5. Related Problem: Eliminate or reduce the negative impact trauma related symptoms have on  social, occupational, and family functioning. Description: Verbalize an understanding of the treatment rationale for PTSD. Target Date: 2022-01-15 Frequency: Weekly Modality: individual Progress: 100% 6. Related Problem: Eliminate or reduce the negative impact trauma related symptoms have on  social, occupational, and family functioning. Description: Learn and implement calming skills. Target Date: 2024-01-16 Frequency: Weekly Modality: individual Progress: 90% ? Planned Intervention: Teach the client calming skills (e.g., breathing retraining, relaxation,calming self-talk) to use in and between sessions when feeling overly distressed.   Courtney Trevino was agreeable with the treatment plan.                [name], PsyD

## 2023-06-07 ENCOUNTER — Ambulatory Visit (INDEPENDENT_AMBULATORY_CARE_PROVIDER_SITE_OTHER): Payer: 59 | Admitting: Psychology

## 2023-06-07 DIAGNOSIS — F4310 Post-traumatic stress disorder, unspecified: Secondary | ICD-10-CM | POA: Diagnosis not present

## 2023-06-07 NOTE — Progress Notes (Signed)
 Date: 06/07/2023 Appointment Start Time: 8:01am Duration: 59 minutes Provider: Maryetta Sneddon, PsyD Type of Session: Individual Therapy  Location of Patient: Home (private location) Location of Provider: Provider's Home (private office) Type of Contact: Caregility video visit with audio  Session Content: Today's appointment was a telepsychological visit due to COVID-19. Courtney Trevino is aware it is her responsibility to secure confidentiality on her end of the session. She provided verbal consent to proceed with today's appointment. Prior to proceeding with today's appointment, Courtney Trevino's physical location at the time of this appointment was obtained as well a phone number she could be reached at in the event of technical difficulties. Courtney Trevino is aware of the limitations of teleheath visits and verbally consented to proceed.  This provider conducted a check-in. Courtney Trevino endorsed a low mood. This Clinical research associate assisted Courtney Trevino in processing her emotional experiences, reflecting and reinforcing her efforts and ongoing signs of growth, provided psychoeducation on assertive communication, problem solving stressors, and formulating value congruent goals.  Courtney Trevino discussed how an ongoing physical illness has negatively impacted her mood. She also discussed examples of various relational stressors that are occurring, noting how some of them are similar to past behaviors from others. She described how despite being physically ill, she has been reducing psychological suffering through maintaining her set limits with others, reinforcing various messages and defusing from relational patterns that are maladaptive, addressing concerns she has about management at her home health aide's organization, and prioritizing self-care. She shared how this differs from her past behaviors, and concluded with plans to continue her current efforts over the coming weeks. She denied having any other issues or concerns.   Courtney Trevino did  not endorse experiencing suicidal or homicidal ideation, plan, or intent since last meeting with this Clinical research associate and described future plans that include ongoing efforts to improve their wellbeing.   Courtney Trevino was receptive to today's appointment as evidenced by openness to sharing and responsiveness to feedback.  Interventions:  Provided empathic reflections and validation Reviewed content from the previous session Provided positive reinforcement Employed supportive psychotherapy interventions to facilitate reduced distress and to improve coping skills with identified stressors Engaged patient in goal setting Engaged patient in problem solving Psychoeducation provided regarding cognitive distortions  Psychoeducation provided regarding self-care Explored patient's values  Reviewed learned skills  DSM-5 Diagnosis(es):  F43.10 Posttraumtic Stress Disorder  Plan: Courtney Trevino is currently scheduled for a follow-up appointment on 06/21/2023 at 8am via Caregility video visit with audio. The next session will focus on value congruent actions.    Treatment Goal & Progress:  1. Related Problem: Eliminate or reduce the negative impact trauma related symptoms have on  social, occupational, and family functioning. Description: Routinely use discussed therapeutic skills during stressors, relational issues,  and/or trauma reminders. Target Date: 2024-01-16 Frequency: Weekly Modality: individual Progress: 90%  2. Related Problem: Eliminate or reduce the negative impact trauma related symptoms have on  social, occupational, and family functioning. Description: Participate in Acceptance and Commitment Therapy (ACT) to reduce the impact  of the traumatic event. Target Date: 2022-01-15 Frequency: Weekly Modality: individual Progress: 100% ? Planned Intervention: Use an ACT approach to PTSD to help the client experience and  accept the presence of troubling thoughts and images without being overly impacted  by  them, and committing his/her time and efforts to activities that are consistent with  identified, personally meaningful values. 3. Related Problem: Eliminate or reduce the negative impact trauma related symptoms have on social, occupational, and family functioning. Description: Learn and implement  personal skills to manage challenging situations related to trauma. Target Date: 2022-01-15 Frequency: Weekly Modality: individual Progress: 100% 4. Related Problem: Eliminate or reduce the negative impact trauma related symptoms have on  social, occupational, and family functioning. Description: Participate in Cognitive Processing Therapy to process the trauma and reduce its  impact. Target Date: 2022-01-15 Frequency: Weekly Modality: individual Progress: 100% 5. Related Problem: Eliminate or reduce the negative impact trauma related symptoms have on  social, occupational, and family functioning. Description: Verbalize an understanding of the treatment rationale for PTSD. Target Date: 2022-01-15 Frequency: Weekly Modality: individual Progress: 100% 6. Related Problem: Eliminate or reduce the negative impact trauma related symptoms have on  social, occupational, and family functioning. Description: Learn and implement calming skills. Target Date: 2024-01-16 Frequency: Weekly Modality: individual Progress: 90% ? Planned Intervention: Teach the client calming skills (e.g., breathing retraining, relaxation,calming self-talk) to use in and between sessions when feeling overly distressed.   Courtney Trevino was agreeable with the treatment plan.                Veronica Gordon, PsyD

## 2023-06-21 ENCOUNTER — Ambulatory Visit (INDEPENDENT_AMBULATORY_CARE_PROVIDER_SITE_OTHER): Payer: 59 | Admitting: Psychology

## 2023-06-21 DIAGNOSIS — F4310 Post-traumatic stress disorder, unspecified: Secondary | ICD-10-CM

## 2023-06-21 NOTE — Progress Notes (Signed)
 Date: 5/7/20252 Appointment Start Time: 8:01am Duration: 59 minutes Provider: Maryetta Sneddon, PsyD Type of Session: Individual Therapy  Location of Patient: Home (private location) Location of Provider: Provider's Home (private office) Type of Contact: Caregility video visit with audio  Session Content: Today's appointment was a telepsychological visit due to COVID-19. Asayo is aware it is her responsibility to secure confidentiality on her end of the session. She provided verbal consent to proceed with today's appointment. Prior to proceeding with today's appointment, Ariany's physical location at the time of this appointment was obtained as well a phone number she could be reached at in the event of technical difficulties. Brynne is aware of the limitations of teleheath visits and verbally consented to proceed.  This provider conducted a check-in. Ms. Prairie endorsed anger. This Clinical research associate assisted Britaney in processing her emotional experiences, reflecting and reinforcing her efforts and ongoing signs of personal growth, problem solving stressors, and formulating value congruent goals. Ms. Tallmadge discussed how a home health aide has not been completing assigned tasks and she suspects she has been stealing from her, although shared how despite the anger she calmly and firmly spoke with the aide's agency and worked to find a mutually agreeable solution with them. She also shared how she has been reading books, which has been an enjoyable and soothing activity for anger.  She discussed how payments from [facility] have not been going out, which has led to financial strains but that she has been speaking with her social worker to obtain additional information and creating a back-up plan in case of a worse case scenario. She further discussed how the aforementioned efforts, reminding herself what is and what is not under her control, challenging various anxiety-related thoughts, spending time with supportive  others, and assisting others have been helpful for mood and reducing psychological suffering. She demonstrated an improved mood at the end of the appointment.   Ms. Kittelson did not endorse experiencing suicidal or homicidal ideation, plan, or intent since last meeting with this Clinical research associate and described future plans that include ongoing efforts to improve their wellbeing.   Genavie was receptive to today's appointment as evidenced by openness to sharing and responsiveness to feedback.  Interventions:  Provided empathic reflections and validation Reviewed content from the previous session Provided positive reinforcement Employed supportive psychotherapy interventions to facilitate reduced distress and to improve coping skills with identified stressors Engaged patient in goal setting Engaged patient in problem solving Psychoeducation provided regarding cognitive distortions  Reviewed learned skills  DSM-5 Diagnosis(es):  F43.10 Posttraumtic Stress Disorder  Plan: Nancye is currently scheduled for a follow-up appointment on 07/05/2023 at 8am via Caregility video visit with audio. The next session will focus on value congruent actions.    Treatment Goal & Progress:  1. Related Problem: Eliminate or reduce the negative impact trauma related symptoms have on  social, occupational, and family functioning. Description: Routinely use discussed therapeutic skills during stressors, relational issues,  and/or trauma reminders. Target Date: 2024-01-16 Frequency: Weekly Modality: individual Progress: 90%  2. Related Problem: Eliminate or reduce the negative impact trauma related symptoms have on  social, occupational, and family functioning. Description: Participate in Acceptance and Commitment Therapy (ACT) to reduce the impact  of the traumatic event. Target Date: 2022-01-15 Frequency: Weekly Modality: individual Progress: 100% ? Planned Intervention: Use an ACT approach to PTSD to help the  client experience and  accept the presence of troubling thoughts and images without being overly impacted by  them, and committing his/her time and efforts  to activities that are consistent with  identified, personally meaningful values. 3. Related Problem: Eliminate or reduce the negative impact trauma related symptoms have on social, occupational, and family functioning. Description: Learn and implement personal skills to manage challenging situations related to trauma. Target Date: 2022-01-15 Frequency: Weekly Modality: individual Progress: 100% 4. Related Problem: Eliminate or reduce the negative impact trauma related symptoms have on  social, occupational, and family functioning. Description: Participate in Cognitive Processing Therapy to process the trauma and reduce its  impact. Target Date: 2022-01-15 Frequency: Weekly Modality: individual Progress: 100% 5. Related Problem: Eliminate or reduce the negative impact trauma related symptoms have on  social, occupational, and family functioning. Description: Verbalize an understanding of the treatment rationale for PTSD. Target Date: 2022-01-15 Frequency: Weekly Modality: individual Progress: 100% 6. Related Problem: Eliminate or reduce the negative impact trauma related symptoms have on  social, occupational, and family functioning. Description: Learn and implement calming skills. Target Date: 2024-01-16 Frequency: Weekly Modality: individual Progress: 90% ? Planned Intervention: Teach the client calming skills (e.g., breathing retraining, relaxation,calming self-talk) to use in and between sessions when feeling overly distressed.   Ms. Shiers was agreeable with the treatment plan.              Veronica Gordon, PsyD

## 2023-07-05 ENCOUNTER — Ambulatory Visit (INDEPENDENT_AMBULATORY_CARE_PROVIDER_SITE_OTHER): Payer: 59 | Admitting: Psychology

## 2023-07-05 DIAGNOSIS — F4310 Post-traumatic stress disorder, unspecified: Secondary | ICD-10-CM | POA: Diagnosis not present

## 2023-07-05 NOTE — Progress Notes (Signed)
 Date: 07/05/2023 Appointment Start Time: 8:02am Duration: 60 minutes Provider: Maryetta Sneddon, PsyD Type of Session: Individual Therapy  Location of Patient: Home (private location) Location of Provider: Provider's Home (private office) Type of Contact: Caregility video visit with audio  Session Content: Today's appointment was a telepsychological visit due to COVID-19. Courtney Trevino is aware it is her responsibility to secure confidentiality on her end of the session. She provided verbal consent to proceed with today's appointment. Prior to proceeding with today's appointment, Courtney Trevino's physical location at the time of this appointment was obtained as well a phone number she could be reached at in the event of technical difficulties. Courtney Trevino is aware of the limitations of teleheath visits and verbally consented to proceed.  This provider conducted a check-in. Courtney Trevino endorsed fatigue. This Clinical research associate assisted Courtney Trevino in processing her emotional experiences, reflecting and reinforcing her efforts, identifying priorities, identifying warning signs for her wellbeing, problem solving stressors, and formulating value congruent goals. Courtney Trevino discussed examples of her supporting various others, noting how these efforts have been fatiguing and emotionally challenging as well as contributed to beliefs she is "being taken advantage of." However, through conversation, she shared how these actions . are value congruent regardless of their outcomes. She identified her priorities for the coming weeks as being assisting her daughter after a surgery, attending to her own medical concerns, and resting. She also identified warning signs for her wellbeing as entering into relational conflicts with others that involve efforts to convince them of her concerns. She concluded with plans to disengage from maladaptive conversations and to go to a calming environment and maintaining set limits. She denied having any other issues  or concerns.   Courtney Trevino did not endorse experiencing suicidal or homicidal ideation, plan, or intent since last meeting with this Clinical research associate and described future plans that include ongoing efforts to improve their wellbeing.   Courtney Trevino was receptive to today's appointment as evidenced by openness to sharing and responsiveness to feedback.  Interventions:  Provided empathic reflections and validation Reviewed content from the previous session Provided positive reinforcement Employed supportive psychotherapy interventions to facilitate reduced distress and to improve coping skills with identified stressors Engaged patient in goal setting Engaged patient in problem solving Psychoeducation provided regarding cognitive distortions  Psychoeducation provided regarding self-care Explored patient's values  Reviewed learned skills  DSM-5 Diagnosis(es):  F43.10 Posttraumtic Stress Disorder  Plan: Courtney Trevino is currently scheduled for a follow-up appointment on 07/19/2023 at 8am via Caregility video visit with audio. The next session will focus on value congruent actions.    Treatment Goal & Progress:  1. Related Problem: Eliminate or reduce the negative impact trauma related symptoms have on  social, occupational, and family functioning. Description: Routinely use discussed therapeutic skills during stressors, relational issues,  and/or trauma reminders. Target Date: 2024-01-16 Frequency: Weekly Modality: individual Progress: 90%  2. Related Problem: Eliminate or reduce the negative impact trauma related symptoms have on  social, occupational, and family functioning. Description: Participate in Acceptance and Commitment Therapy (ACT) to reduce the impact  of the traumatic event. Target Date: 2022-01-15 Frequency: Weekly Modality: individual Progress: 100% ? Planned Intervention: Use an ACT approach to PTSD to help the client experience and  accept the presence of troubling thoughts and  images without being overly impacted by  them, and committing his/her time and efforts to activities that are consistent with  identified, personally meaningful values. 3. Related Problem: Eliminate or reduce the negative impact trauma related symptoms have on social, occupational, and  family functioning. Description: Learn and implement personal skills to manage challenging situations related to trauma. Target Date: 2022-01-15 Frequency: Weekly Modality: individual Progress: 100% 4. Related Problem: Eliminate or reduce the negative impact trauma related symptoms have on  social, occupational, and family functioning. Description: Participate in Cognitive Processing Therapy to process the trauma and reduce its  impact. Target Date: 2022-01-15 Frequency: Weekly Modality: individual Progress: 100% 5. Related Problem: Eliminate or reduce the negative impact trauma related symptoms have on  social, occupational, and family functioning. Description: Verbalize an understanding of the treatment rationale for PTSD. Target Date: 2022-01-15 Frequency: Weekly Modality: individual Progress: 100% 6. Related Problem: Eliminate or reduce the negative impact trauma related symptoms have on  social, occupational, and family functioning. Description: Learn and implement calming skills. Target Date: 2024-01-16 Frequency: Weekly Modality: individual Progress: 90% ? Planned Intervention: Teach the client calming skills (e.g., breathing retraining, relaxation,calming self-talk) to use in and between sessions when feeling overly distressed.   Ms. Hyson was agreeable with the treatment plan.               Veronica Gordon, PsyD

## 2023-07-19 ENCOUNTER — Ambulatory Visit: Admitting: Psychology

## 2023-07-19 DIAGNOSIS — F4310 Post-traumatic stress disorder, unspecified: Secondary | ICD-10-CM

## 2023-07-19 NOTE — Progress Notes (Signed)
 Date: 07/19/2023 Appointment Start Time: 8:04am  Duration: 60 minutes Provider: Maryetta Sneddon, PsyD Type of Session: Individual Therapy  Location of Patient: Home (private location) Location of Provider: Provider's Home (private office) Type of Contact: Caregility video visit with audio  Session Content: Today's appointment was a telepsychological visit due to COVID-19. Courtney Trevino is aware it is her responsibility to secure confidentiality on her end of the session. She provided verbal consent to proceed with today's appointment. Prior to proceeding with today's appointment, [place]'s physical location at the time of this appointment was obtained as well a phone number she could be reached at in the event of technical difficulties. Courtney Trevino is aware of the limitations of teleheath visits and verbally consented to proceed.  This provider conducted a check-in. Mr. Arvin endorsed low mood.This Clinical research associate assisted Courtney Trevino in processing her emotional experiences, reflecting and reinforcing her efforts, problem solving stressors, identifying warning signs for her wellbeing, and formulating value congruent plans. Ms. Rought discussed how she is physically ill, and how she is "tired of not feeling good." She further discussed how her doctor's recent advice has led to her withdrawing from various relational dynamics with others, noting examples of these situations. She shared how her engagement in these dynamics takes considerable time and effort, negatively impacts her wellbeing, and occasionally reinforces the maladaptive behaviors of others. She identified warning signs for her wellbeing as including engaged in efforts she had previously told the other parties that she would not and/or not prioritizing her wellbeing. She concluded with plans to use visual reminders of her goals. She denied having any other issues or concerns.   Ms. Posa did not endorse experiencing suicidal or homicidal ideation, plan, or intent  since last meeting with this Clinical research associate and described future plans that include ongoing efforts to improve their wellbeing.   Courtney Trevino was receptive to today's appointment as evidenced by openness to sharing and responsiveness to feedback.  Interventions:  Provided empathic reflections and validation Reviewed content from the previous session Provided positive reinforcement Employed supportive psychotherapy interventions to facilitate reduced distress and to improve coping skills with identified stressors Engaged patient in goal setting Engaged patient in problem solving Psychoeducation provided regarding cognitive distortions  Psychoeducation provided regarding self-care Explored patient's values  Reviewed learned skills  DSM-5 Diagnosis(es):  F43.10 Posttraumtic Stress Disorder  Plan: Courtney Trevino is currently scheduled for a follow-up appointment on 08/02/2023 at 8am via Caregility video visit with audio. The next session will focus on value congruent actions.   Treatment Goal & Progress:  1. Related Problem: Eliminate or reduce the negative impact trauma related symptoms have on  social, occupational, and family functioning. Description: Routinely use discussed therapeutic skills during stressors, relational issues,  and/or trauma reminders. Target Date: 2024-01-16 Frequency: Weekly Modality: individual Progress: 90%  2. Related Problem: Eliminate or reduce the negative impact trauma related symptoms have on  social, occupational, and family functioning. Description: Participate in Acceptance and Commitment Therapy (ACT) to reduce the impact  of the traumatic event. Target Date: 2022-01-15 Frequency: Weekly Modality: individual Progress: 100% ? Planned Intervention: Use an ACT approach to PTSD to help the client experience and  accept the presence of troubling thoughts and images without being overly impacted by  them, and committing his/her time and efforts to activities that are  consistent with  identified, personally meaningful values. 3. Related Problem: Eliminate or reduce the negative impact trauma related symptoms have on social, occupational, and family functioning. Description: Learn and implement personal skills to manage challenging situations  related to trauma. Target Date: 2022-01-15 Frequency: Weekly Modality: individual Progress: 100% 4. Related Problem: Eliminate or reduce the negative impact trauma related symptoms have on  social, occupational, and family functioning. Description: Participate in Cognitive Processing Therapy to process the trauma and reduce its  impact. Target Date: 2022-01-15 Frequency: Weekly Modality: individual Progress: 100% 5. Related Problem: Eliminate or reduce the negative impact trauma related symptoms have on  social, occupational, and family functioning. Description: Verbalize an understanding of the treatment rationale for PTSD. Target Date: 2022-01-15 Frequency: Weekly Modality: individual Progress: 100% 6. Related Problem: Eliminate or reduce the negative impact trauma related symptoms have on  social, occupational, and family functioning. Description: Learn and implement calming skills. Target Date: 2024-01-16 Frequency: Weekly Modality: individual Progress: 90% ? Planned Intervention: Teach the client calming skills (e.g., breathing retraining, relaxation,calming self-talk) to use in and between sessions when feeling overly distressed.   Ms. Cara was agreeable with the treatment plan.                Veronica Gordon, PsyD

## 2023-08-02 ENCOUNTER — Ambulatory Visit (INDEPENDENT_AMBULATORY_CARE_PROVIDER_SITE_OTHER): Admitting: Psychology

## 2023-08-02 DIAGNOSIS — F4310 Post-traumatic stress disorder, unspecified: Secondary | ICD-10-CM

## 2023-08-02 NOTE — Progress Notes (Signed)
 Date: 08/02/2023 Appointment Start Time: 8am Duration: 60 minutes Provider: Maryetta Sneddon, PsyD Type of Session: Individual Therapy  Location of Patient: Home (private location) Location of Provider: Provider's Home (private office) Type of Contact: Caregility video visit with audio  Session Content: Today's appointment was a telepsychological visit due to COVID-19. Courtney Trevino is aware it is her responsibility to secure confidentiality on her end of the session. She provided verbal consent to proceed with today's appointment. Prior to proceeding with today's appointment, Courtney Trevino's physical location at the time of this appointment was obtained as well a phone number she could be reached at in the event of technical difficulties. Courtney Trevino is aware of the limitations of teleheath visits and verbally consented to proceed.  This provider conducted a check-in. Courtney Trevino endorsed frustration. This Clinical research associate assisted Courtney Trevino in processing her emotional experiences, reflecting and reinforcing her efforts and identifying utilized coping strategies, problem solving stressors, and formulating plans. Courtney Trevino discussed how relational issues with an aide has led to her pursuing a new home aide organization as well as how she is setting boundaries with a friend. She further discussed how in both of the aforementioned situations, the other involved parties demonstrated dishonesty, which has led to feelings of irritability and distrust. She noted how despite this she has been coping through use of an accepting attitude and defusion from areas outside her control. She reported hwo ongoing medical issues have contributed to fatigue, but that she has been balancing rest and spending time with others, which has been helpful for her wellbeign.   Courtney Trevino shared concerns about her being blunt with others. Through conversation, she identified obstacles to communicating differently include impulsivity, irritability, and  stress. She concluded with plans to utilize reminders (e.g., telling herself it is how you say it and having a rubber band on her wrist she can snap before sayign something) and being mindful of warning signs. She denied having any other issues or concerns.   Courtney Trevino did not endorse experiencing suicidal or homicidal ideation, plan, or intent since last meeting with this Clinical research associate and described future plans that include ongoing efforts to improve their wellbeing.   Courtney Trevino was receptive to today's appointment as evidenced by openness to sharing and responsiveness to feedback.  Interventions:  Provided empathic reflections and validation Reviewed content from the previous session Provided positive reinforcement Employed supportive psychotherapy interventions to facilitate reduced distress and to improve coping skills with identified stressors Engaged patient in goal setting Engaged patient in problem solving Psychoeducation provided regarding cognitive distortions  Psychoeducation provided regarding self-care Explored patient's values  Reviewed learned skills  DSM-5 Diagnosis(es):  F43.10 Posttraumtic Stress Disorder  Plan: Courtney Trevino is currently scheduled for a follow-up appointment on 08/16/2023 at 8am via Caregility video visit with audio. The next session will focus on value congruent actions.   Treatment Goal & Progress:  1. Related Problem: Eliminate or reduce the negative impact trauma related symptoms have on  social, occupational, and family functioning. Description: Routinely use discussed therapeutic skills during stressors, relational issues,  and/or trauma reminders. Target Date: 2024-01-16 Frequency: Weekly Modality: individual Progress: 90%  2. Related Problem: Eliminate or reduce the negative impact trauma related symptoms have on  social, occupational, and family functioning. Description: Participate in Acceptance and Commitment Therapy (ACT) to reduce the impact   of the traumatic event. Target Date: 2022-01-15 Frequency: Weekly Modality: individual Progress: 100% ? Planned Intervention: Use an ACT approach to PTSD to help the client experience and  accept the presence  of troubling thoughts and images without being overly impacted by  them, and committing his/her time and efforts to activities that are consistent with  identified, personally meaningful values. 3. Related Problem: Eliminate or reduce the negative impact trauma related symptoms have on social, occupational, and family functioning. Description: Learn and implement personal skills to manage challenging situations related to trauma. Target Date: 2022-01-15 Frequency: Weekly Modality: individual Progress: 100% 4. Related Problem: Eliminate or reduce the negative impact trauma related symptoms have on  social, occupational, and family functioning. Description: Participate in Cognitive Processing Therapy to process the trauma and reduce its  impact. Target Date: 2022-01-15 Frequency: Weekly Modality: individual Progress: 100% 5. Related Problem: Eliminate or reduce the negative impact trauma related symptoms have on  social, occupational, and family functioning. Description: Verbalize an understanding of the treatment rationale for PTSD. Target Date: 2022-01-15 Frequency: Weekly Modality: individual Progress: 100% 6. Related Problem: Eliminate or reduce the negative impact trauma related symptoms have on  social, occupational, and family functioning. Description: Learn and implement calming skills. Target Date: 2024-01-16 Frequency: Weekly Modality: individual Progress: 90% ? Planned Intervention: Teach the client calming skills (e.g., breathing retraining, relaxation,calming self-talk) to use in and between sessions when feeling overly distressed.   [name] was agreeable with the treatment plan.                Veronica Gordon, PsyD

## 2023-08-16 ENCOUNTER — Ambulatory Visit (INDEPENDENT_AMBULATORY_CARE_PROVIDER_SITE_OTHER): Admitting: Psychology

## 2023-08-16 DIAGNOSIS — F4310 Post-traumatic stress disorder, unspecified: Secondary | ICD-10-CM | POA: Diagnosis not present

## 2023-08-16 NOTE — Progress Notes (Signed)
 Date: 08/16/2023 Appointment Start Time: 8:02am Duration: 43 minutes Provider: Frederic Fire, PsyD Type of Session: Individual Therapy  Location of Patient: Home (private location) Location of Provider: Provider's Home (private office) Type of Contact: Caregility video visit with audio  Session Content: Today's appointment was a telepsychological visit due to COVID-19. Courtney Trevino is aware it is her responsibility to secure confidentiality on her end of the session. She provided verbal consent to proceed with today's appointment. Prior to proceeding with today's appointment, Courtney Trevino's physical location at the time of this appointment was obtained as well a phone number she could be reached at in the event of technical difficulties. Courtney Trevino is aware of the limitations of teleheath visits and verbally consented to proceed.  This provider conducted a check-in. Courtney Trevino endorsed frustration. This Clinical research associate assisted Courtney Trevino in processing her emotional experiences, reflecting and reinforcing her efforts, problem solving stressors, reflecting signs of personal growth, and formulating value congruent goals. Courtney Trevino discussed various relational strains that have been occurring with her daughter and two friends, noting how the situations are perceived as others being inconsiderate, attempting to push past her set boundaries, deceit, and manipulation. She further discussed how this has caused anger but that she has been pausing, considering her emotional experiences, and responded in a calm but firm manner. She shared how in the past she would have been more likely to hold in her anger until a blow up occurred, which would contribute to later regret and exacerbated relational strains. She noted how her efforts have led to plans to move into a new home which she is excited about. She concluded with plans to continue receiving assistance for an upcoming move and to discuss her current emotional status and personal  growth with her godmother. She denied having any other issues or concerns, and expressed appreciation for this writer's service.   Courtney Trevino did not endorse experiencing suicidal or homicidal ideation, plan, or intent since last meeting with this Clinical research associate and described future plans that include ongoing efforts to improve their wellbeing.   Courtney Trevino was receptive to today's appointment as evidenced by openness to sharing and responsiveness to feedback.  Interventions:  Provided empathic reflections and validation Reviewed content from the previous session Provided positive reinforcement Employed supportive psychotherapy interventions to facilitate reduced distress and to improve coping skills with identified stressors Engaged patient in goal setting Engaged patient in problem solving Psychoeducation provided regarding cognitive distortions  Explored patient's values  Reviewed learned skills  DSM-5 Diagnosis(es):  F43.10 Posttraumtic Stress Disorder  Plan: Courtney Trevino is currently scheduled for a follow-up appointment on 08/30/2023 at 8am via Caregility video visit with audio. The next session will focus on value congruent actions.    Treatment Goal & Progress:  1. Related Problem: Eliminate or reduce the negative impact trauma related symptoms have on  social, occupational, and family functioning. Description: Routinely use discussed therapeutic skills during stressors, relational issues,  and/or trauma reminders. Target Date: 2024-01-16 Frequency: Weekly Modality: individual Progress: 90%  2. Related Problem: Eliminate or reduce the negative impact trauma related symptoms have on  social, occupational, and family functioning. Description: Participate in Acceptance and Commitment Therapy (ACT) to reduce the impact  of the traumatic event. Target Date: 2022-01-15 Frequency: Weekly Modality: individual Progress: 100% ? Planned Intervention: Use an ACT approach to PTSD to help the  client experience and  accept the presence of troubling thoughts and images without being overly impacted by  them, and committing his/her time and efforts to activities that are  consistent with  identified, personally meaningful values. 3. Related Problem: Eliminate or reduce the negative impact trauma related symptoms have on social, occupational, and family functioning. Description: Learn and implement personal skills to manage challenging situations related to trauma. Target Date: 2022-01-15 Frequency: Weekly Modality: individual Progress: 100% 4. Related Problem: Eliminate or reduce the negative impact trauma related symptoms have on  social, occupational, and family functioning. Description: Participate in Cognitive Processing Therapy to process the trauma and reduce its  impact. Target Date: 2022-01-15 Frequency: Weekly Modality: individual Progress: 100% 5. Related Problem: Eliminate or reduce the negative impact trauma related symptoms have on  social, occupational, and family functioning. Description: Verbalize an understanding of the treatment rationale for PTSD. Target Date: 2022-01-15 Frequency: Weekly Modality: individual Progress: 100% 6. Related Problem: Eliminate or reduce the negative impact trauma related symptoms have on  social, occupational, and family functioning. Description: Learn and implement calming skills. Target Date: 2024-01-16 Frequency: Weekly Modality: individual Progress: 90% ? Planned Intervention: Teach the client calming skills (e.g., breathing retraining, relaxation,calming self-talk) to use in and between sessions when feeling overly distressed.   Courtney Trevino was agreeable with the treatment plan.              Frederic ONEIDA Fire, PsyD

## 2023-08-30 ENCOUNTER — Ambulatory Visit (INDEPENDENT_AMBULATORY_CARE_PROVIDER_SITE_OTHER): Admitting: Psychology

## 2023-08-30 DIAGNOSIS — F4310 Post-traumatic stress disorder, unspecified: Secondary | ICD-10-CM

## 2023-08-30 NOTE — Progress Notes (Signed)
 Date: 08/30/2023 Appointment Start Time: 8am Duration: 60 minutes Provider: Frederic Fire, PsyD Type of Session: Individual Therapy  Location of Patient: Home (private location) Location of Provider: Provider's Home (private office) Type of Contact: Caregility video visit with audio  Session Content: Today's appointment was a telepsychological visit due to COVID-19. Courtney Trevino is aware it is her responsibility to secure confidentiality on her end of the session. She provided verbal consent to proceed with today's appointment. Prior to proceeding with today's appointment, Courtney Trevino's physical location at the time of this appointment was obtained as well a phone number she could be reached at in the event of technical difficulties. Courtney Trevino is aware of the limitations of teleheath visits and verbally consented to proceed.  This provider conducted a check-in. Ms. Courtney Trevino endorsed anxiety. This Clinical research associate assisted Courtney Trevino in processing her emotional experiences, reflecting and reinforcing her efforts, problem solving stressors, identifying her values, processing decisions she is making, and formulating value congruent goals. Ms. Courtney Trevino discussed examples of ongoing relational strains and concerns about other's behaviors, noting how their behaviors stem from longstanding relational and behavioral patterns. She noted how she has been managing these situations through consideration of her values, priorities, long-term wellbeing, and factors within and outside her control; reminding herself how it will work out in the end; through assistance from her support group, planning and problem solving; and reminding herself of how she has made it through more difficult situations in the past. She further noted how those efforts have reduced anxiety and increased value congruent actions, and have led to upcoming opportunities (e.g., a new home and spending time around horses) she is excited for. She expressed awareness of how  some of her current decisions may contribute to other's experiencing various negative consequences, but that she plans to remind herself that she is making these decisions  from a place of care and that assisting them in the requested manner would likely negatively impact her wellbeing and reinforce various maladaptive longstanding behavioral patterns. She demonstrated an improved mood at the end of the appointment. She denied having any other issues or concerns.   Ms. Courtney Trevino did not endorse experiencing suicidal or homicidal ideation, plan, or intent since last meeting with this Clinical research associate and described future plans that include ongoing efforts to improve their wellbeing.   Courtney Trevino was receptive to today's appointment as evidenced by openness to sharing and responsiveness to feedback.  Interventions:  Employed supportive psychotherapy interventions to reduce distress associated with COVID-19 Engaged patient in goal setting Engaged patient in problem solving Psychoeducation provided regarding cognitive distortions  Explored patient's values  Reviewed learned skills  DSM-5 Diagnosis(es):  F43.10 Posttraumtic Stress Disorder  Plan: Courtney Trevino is currently scheduled for a follow-up appointment on 09/13/2023 at 8am via Caregility video visit with audio. The next session will focus on value congruent actions.    Treatment Goal & Progress:  1. Related Problem: Eliminate or reduce the negative impact trauma related symptoms have on  social, occupational, and family functioning. Description: Routinely use discussed therapeutic skills during stressors, relational issues,  and/or trauma reminders. Target Date: 2024-01-16 Frequency: Weekly Modality: individual Progress: 90%  2. Related Problem: Eliminate or reduce the negative impact trauma related symptoms have on  social, occupational, and family functioning. Description: Participate in Acceptance and Commitment Therapy (ACT) to reduce the impact   of the traumatic event. Target Date: 2022-01-15 Frequency: Weekly Modality: individual Progress: 100% ? Planned Intervention: Use an ACT approach to PTSD to help the client experience and  accept the  presence of troubling thoughts and images without being overly impacted by  them, and committing his/her time and efforts to activities that are consistent with  identified, personally meaningful values. 3. Related Problem: Eliminate or reduce the negative impact trauma related symptoms have on social, occupational, and family functioning. Description: Learn and implement personal skills to manage challenging situations related to trauma. Target Date: 2022-01-15 Frequency: Weekly Modality: individual Progress: 100% 4. Related Problem: Eliminate or reduce the negative impact trauma related symptoms have on  social, occupational, and family functioning. Description: Participate in Cognitive Processing Therapy to process the trauma and reduce its  impact. Target Date: 2022-01-15 Frequency: Weekly Modality: individual Progress: 100% 5. Related Problem: Eliminate or reduce the negative impact trauma related symptoms have on  social, occupational, and family functioning. Description: Verbalize an understanding of the treatment rationale for PTSD. Target Date: 2022-01-15 Frequency: Weekly Modality: individual Progress: 100% 6. Related Problem: Eliminate or reduce the negative impact trauma related symptoms have on  social, occupational, and family functioning. Description: Learn and implement calming skills. Target Date: 2024-01-16 Frequency: Weekly Modality: individual Progress: 90% ? Planned Intervention: Teach the client calming skills (e.g., breathing retraining, relaxation,calming self-talk) to use in and between sessions when feeling overly distressed.   Ms. Courtney Trevino was agreeable with the treatment plan.              Frederic ONEIDA Fire, PsyD

## 2023-09-13 ENCOUNTER — Ambulatory Visit (INDEPENDENT_AMBULATORY_CARE_PROVIDER_SITE_OTHER): Admitting: Psychology

## 2023-09-13 DIAGNOSIS — F4310 Post-traumatic stress disorder, unspecified: Secondary | ICD-10-CM

## 2023-09-13 NOTE — Progress Notes (Signed)
 Date: 09/13/2023 Appointment Start Time: 8am Duration: 61 minutes Provider: Frederic Fire, PsyD Type of Session: Individual Therapy  Location of Patient: Home (private location) Location of Provider: Provider's Home (private office) Type of Contact: Caregility video visit with audio  Session Content: Today's appointment was a telepsychological visit due to COVID-19. Alonah is aware it is her responsibility to secure confidentiality on her end of the session. She provided verbal consent to proceed with today's appointment. Prior to proceeding with today's appointment, Verbie's physical location at the time of this appointment was obtained as well a phone number she could be reached at in the event of technical difficulties. Harjot is aware of the limitations of teleheath visits and verbally consented to proceed.  This provider conducted a check-in. Ms. Head endorsed frustration. This Clinical research associate assisted Irini in processing her emotional experiences, reflecting and reinforcing their efforts, problem solving stressors, and formulating value congruent goals..  Ms. Lozano discussed examples of various relational strains with family members, noting how they fit longstanding relational issues. She further discussed how she has been increasingly considering best responses to the involved parties, use of her energy, and planning and problem solving, which has led to value congruent actions. Through conversation, she noted plans to continue these efforts, write down her priorities and plans, to further disengage from the involved parties and to focus her energy on an upcoming move and supporting various others and herself. She denied having any other issues or concerns.   Ms. Macioce did not endorse experiencing suicidal or homicidal ideation, plan, or intent since last meeting with this Clinical research associate and described future plans that include ongoing efforts to improve their wellbeing.   Rory was receptive to  today's appointment as evidenced by openness to sharing and responsiveness to feedback.  Interventions:  Provided empathic reflections and validation Reviewed content from the previous session Provided positive reinforcement Employed supportive psychotherapy interventions to facilitate reduced distress and to improve coping skills with identified stressors Engaged patient in goal setting Engaged patient in problem solving Psychoeducation provided regarding cognitive distortions  Explored patient's values   DSM-5 Diagnosis(es):  F43.10 Posttraumtic Stress Disorder  Plan: Dao is currently scheduled for a follow-up appointment on 09/27/2023 at 8am via Caregility video visit with audio. The next session will focus on valuec ongruent actions.   Treatment Goal & Progress:  1. Related Problem: Eliminate or reduce the negative impact trauma related symptoms have on  social, occupational, and family functioning. Description: Routinely use discussed therapeutic skills during stressors, relational issues,  and/or trauma reminders. Target Date: 2024-01-16 Frequency: Weekly Modality: individual Progress: 90%  2. Related Problem: Eliminate or reduce the negative impact trauma related symptoms have on  social, occupational, and family functioning. Description: Participate in Acceptance and Commitment Therapy (ACT) to reduce the impact  of the traumatic event. Target Date: 2022-01-15 Frequency: Weekly Modality: individual Progress: 100% ? Planned Intervention: Use an ACT approach to PTSD to help the client experience and  accept the presence of troubling thoughts and images without being overly impacted by  them, and committing his/her time and efforts to activities that are consistent with  identified, personally meaningful values. 3. Related Problem: Eliminate or reduce the negative impact trauma related symptoms have on social, occupational, and family functioning. Description: Learn  and implement personal skills to manage challenging situations related to trauma. Target Date: 2022-01-15 Frequency: Weekly Modality: individual Progress: 100% 4. Related Problem: Eliminate or reduce the negative impact trauma related symptoms have on  social, occupational, and family functioning.  Description: Participate in Cognitive Processing Therapy to process the trauma and reduce its  impact. Target Date: 2022-01-15 Frequency: Weekly Modality: individual Progress: 100% 5. Related Problem: Eliminate or reduce the negative impact trauma related symptoms have on  social, occupational, and family functioning. Description: Verbalize an understanding of the treatment rationale for PTSD. Target Date: 2022-01-15 Frequency: Weekly Modality: individual Progress: 100% 6. Related Problem: Eliminate or reduce the negative impact trauma related symptoms have on  social, occupational, and family functioning. Description: Learn and implement calming skills. Target Date: 2024-01-16 Frequency: Weekly Modality: individual Progress: 90% ? Planned Intervention: Teach the client calming skills (e.g., breathing retraining, relaxation,calming self-talk) to use in and between sessions when feeling overly distressed.   Ms. Mullins was agreeable with the treatment plan.               Frederic ONEIDA Fire, PsyD

## 2023-09-27 ENCOUNTER — Ambulatory Visit: Admitting: Psychology

## 2023-09-27 DIAGNOSIS — F4310 Post-traumatic stress disorder, unspecified: Secondary | ICD-10-CM

## 2023-09-27 NOTE — Progress Notes (Signed)
 Date: 09/27/2023 Appointment Start Time: 8:01am Duration: 60 minutes Provider: Frederic Fire, PsyD Type of Session: Individual Therapy  Location of Patient: Home (private location) Location of Provider: Provider's Home (private office) Type of Contact: Caregility video visit with audio  Session Content: Today's appointment was a telepsychological visit due to COVID-19. Courtney Trevino is aware it is her responsibility to secure confidentiality on her end of the session. She provided verbal consent to proceed with today's appointment. Prior to proceeding with today's appointment, Courtney Trevino's physical location at the time of this appointment was obtained as well a phone number she could be reached at in the event of technical difficulties. Courtney Trevino is aware of the limitations of teleheath visits and verbally consented to proceed.  This provider conducted a check-in. Courtney Trevino endorsed anxiety. This Clinical research associate assisted Courtney Trevino in processing her emotional experiences, reflecting and reinforcing her efforts, problem solving stressors, identifying her priorities, and formulating value congruent goals. Courtney Trevino discussed how a recent move involved stressors secondary to various other's behaviors and relational strains, as well as that she her aide was fired. She further discussed examples of how despite the aforementioned her plans were helpful, she paused before making decisions and assuming other's intentions, expressed her concerns, and problem solved. She noted there is some lingering uncertainty, but that her efforts led to value congruent actions, and despite. Through conversation, she reported how her priorities and goals for the coming weeks include speaking with her medical care team about ongoing back pain, unpacking, and finding a new aide. She expressed optimism about her ability to follow through with the aforementioned plan. She denied having any other issues or concerns.   Courtney Trevino did not endorse  experiencing suicidal or homicidal ideation, plan, or intent since last meeting with this Clinical research associate and described future plans that include ongoing efforts to improve their wellbeing.   The appointment duration and subsequent use of billing code 09162 was deemed necessary as the patient has endorsed chronic mental health concerns that result in impairment in daily functioning; meets with this writer on a bi-weekly or less frequency basis, and continues to indicate benefiting from an extended time being allowed to process their experiences and implement the aforementioned interventions.   Courtney Trevino was receptive to today's appointment as evidenced by openness to sharing and responsiveness to feedback.  Interventions:  Provided empathic reflections and validation Reviewed content from the previous session Provided positive reinforcement Employed supportive psychotherapy interventions to facilitate reduced distress and to improve coping skills with identified stressors Engaged patient in goal setting Engaged patient in problem solving Reviewed learned skills  DSM-5 Diagnosis(es):  F43.10 Posttraumtic Stress Disorder  Plan: Courtney Trevino is currently scheduled for a follow-up appointment on 10/11/2023 at 8am via Caregility video visit with audio. The next session will focus on value congruent actions.   Treatment Goal & Progress:  1. Related Problem: Eliminate or reduce the negative impact trauma related symptoms have on  social, occupational, and family functioning. Description: Routinely use discussed therapeutic skills during stressors, relational issues,  and/or trauma reminders. Target Date: 2024-01-16 Frequency: Weekly Modality: individual Progress: 90%  2. Related Problem: Eliminate or reduce the negative impact trauma related symptoms have on  social, occupational, and family functioning. Description: Participate in Acceptance and Commitment Therapy (ACT) to reduce the impact  of the  traumatic event. Target Date: 2022-01-15 Frequency: Weekly Modality: individual Progress: 100% ? Planned Intervention: Use an ACT approach to PTSD to help the client experience and  accept the presence of troubling thoughts and  images without being overly impacted by  them, and committing his/her time and efforts to activities that are consistent with  identified, personally meaningful values. 3. Related Problem: Eliminate or reduce the negative impact trauma related symptoms have on social, occupational, and family functioning. Description: Learn and implement personal skills to manage challenging situations related to trauma. Target Date: 2022-01-15 Frequency: Weekly Modality: individual Progress: 100% 4. Related Problem: Eliminate or reduce the negative impact trauma related symptoms have on  social, occupational, and family functioning. Description: Participate in Cognitive Processing Therapy to process the trauma and reduce its  impact. Target Date: 2022-01-15 Frequency: Weekly Modality: individual Progress: 100% 5. Related Problem: Eliminate or reduce the negative impact trauma related symptoms have on  social, occupational, and family functioning. Description: Verbalize an understanding of the treatment rationale for PTSD. Target Date: 2022-01-15 Frequency: Weekly Modality: individual Progress: 100% 6. Related Problem: Eliminate or reduce the negative impact trauma related symptoms have on  social, occupational, and family functioning. Description: Learn and implement calming skills. Target Date: 2024-01-16 Frequency: Weekly Modality: individual Progress: 90% ? Planned Intervention: Teach the client calming skills (e.g., breathing retraining, relaxation,calming self-talk) to use in and between sessions when feeling overly distressed.   Courtney Trevino was agreeable with the treatment plan.                Frederic ONEIDA Fire, PsyD

## 2023-10-11 ENCOUNTER — Ambulatory Visit (INDEPENDENT_AMBULATORY_CARE_PROVIDER_SITE_OTHER): Admitting: Psychology

## 2023-10-11 DIAGNOSIS — F4310 Post-traumatic stress disorder, unspecified: Secondary | ICD-10-CM

## 2023-10-11 NOTE — Progress Notes (Addendum)
 Date: 10/11/2023 Appointment Start Time: 8:04am Duration: 63 minutes Provider: Frederic Fire, PsyD Type of Session: Individual Therapy  Location of Patient: Home (private location) Location of Provider: Provider's Home (private office) Type of Contact: Caregility video visit with audio  Session Content: Today's appointment was a telepsychological visit due to COVID-19. Courtney Trevino is aware it is her responsibility to secure confidentiality on her end of the session. She provided verbal consent to proceed with today's appointment. Prior to proceeding with today's appointment, Courtney Trevino's physical location at the time of this appointment was obtained as well a phone number she could be reached at in the event of technical difficulties. Courtney Trevino is aware of the limitations of teleheath visits and verbally consented to proceed.  This provider conducted a check-in. Courtney Trevino endorsed frustration. This Company secretary in processing her emotional experiences, reflecting and reinforcing her efforts as well as ongoing signs of personal growth, identifying her values and areas outside of her control, providing psychoeducation on the emotion mind and logic mind, problem solving stressors, and formulating value congruent plans. Courtney Trevino discussed examples of multiple stressors and relational strains that have occurred over the past two weeks, noting how some have been emotionally painful. Through conversation, she shared how despite the aforementioned she has been largely engaging in value congruent actions; utilizing assertive communication and boundary setting, mindfulness of her emotional experiences, her limits, and warning signs; considering best uses of her energy; reminding herself of situations outside her control; expressing gratitude for things that are going well; and keeping in mind lessons learned from past maladaptive behaviors. She described how these efforts have led to her using her wise mind  and being more flexible in how she responds to stressors, has prevented psychological suffering from occurring, and has allowed her to meet various goals. She denied having any other issues or concerns, and demonstrated an improved mood at the end of the appointment.   Courtney Trevino did not endorse experiencing suicidal or homicidal ideation, plan, or intent since last meeting with this Clinical research associate and described future plans that include ongoing efforts to improve their wellbeing.   The appointment duration and subsequent use of billing code [id number] was deemed necessary as the patient has endorsed chronic mental health concerns that result in impairment in daily functioning; meets with this writer on a bi-weekly or less frequency basis; and continue to indicate benefiting from an extended time being allowed to process her experiences and implement the aforementioned interventions.   Courtney Trevino was receptive to today's appointment as evidenced by openness to sharing and responsiveness to feedback.  Interventions:  Provided empathic reflections and validation Reviewed content from the previous session Provided positive reinforcement Employed supportive psychotherapy interventions to facilitate reduced distress and to improve coping skills with identified stressors Engaged patient in goal setting Engaged patient in problem solving Psychoeducation provided regarding cognitive distortions  Psychoeducation provided regarding all-or-nothing thinking Explored patient's values  Reviewed learned skills  DSM-5 Diagnosis(es):  F43.10 Posttraumtic Stress Disorder  Plan: Courtney Trevino is currently scheduled for a follow-up appointment on 10/25/2023 at 8am via Caregility video visit with audio. The next session will focus on value congruent actions.    Treatment Goal & Progress:  1. Related Problem: Eliminate or reduce the negative impact trauma related symptoms have on  social, occupational, and family  functioning. Description: Routinely use discussed therapeutic skills during stressors, relational issues,  and/or trauma reminders. Target Date: 2024-01-16 Frequency: Weekly Modality: individual Progress: 90%  2. Related Problem: Eliminate or reduce the negative  impact trauma related symptoms have on  social, occupational, and family functioning. Description: Participate in Acceptance and Commitment Therapy (ACT) to reduce the impact  of the traumatic event. Target Date: 2022-01-15 Frequency: Weekly Modality: individual Progress: 100% ? Planned Intervention: Use an ACT approach to PTSD to help the client experience and  accept the presence of troubling thoughts and images without being overly impacted by  them, and committing his/her time and efforts to activities that are consistent with  identified, personally meaningful values. 3. Related Problem: Eliminate or reduce the negative impact trauma related symptoms have on social, occupational, and family functioning. Description: Learn and implement personal skills to manage challenging situations related to trauma. Target Date: 2022-01-15 Frequency: Weekly Modality: individual Progress: 100% 4. Related Problem: Eliminate or reduce the negative impact trauma related symptoms have on  social, occupational, and family functioning. Description: Participate in Cognitive Processing Therapy to process the trauma and reduce its  impact. Target Date: 2022-01-15 Frequency: Weekly Modality: individual Progress: 100% 5. Related Problem: Eliminate or reduce the negative impact trauma related symptoms have on  social, occupational, and family functioning. Description: Verbalize an understanding of the treatment rationale for PTSD. Target Date: 2022-01-15 Frequency: Weekly Modality: individual Progress: 100% 6. Related Problem: Eliminate or reduce the negative impact trauma related symptoms have on  social, occupational, and family  functioning. Description: Learn and implement calming skills. Target Date: 2024-01-16 Frequency: Weekly Modality: individual Progress: 90% ? Planned Intervention: Teach the client calming skills (e.g., breathing retraining, relaxation,calming self-talk) to use in and between sessions when feeling overly distressed.   Courtney Trevino was agreeable with the treatment plan.               Frederic ONEIDA Fire, PsyD

## 2023-10-25 ENCOUNTER — Ambulatory Visit (INDEPENDENT_AMBULATORY_CARE_PROVIDER_SITE_OTHER): Admitting: Psychology

## 2023-10-25 DIAGNOSIS — F4310 Post-traumatic stress disorder, unspecified: Secondary | ICD-10-CM

## 2023-10-25 NOTE — Progress Notes (Unsigned)
 Date: 10/25/2023 Appointment Start Time: 8:02am (pt and this Clinical research associate briefly left and rejoined the virtual visit at 8:31am secondary to tech issues) Duration: 58 minutes Provider: Frederic Fire, PsyD Type of Session: Individual Therapy  Location of Patient: Home (private location) Location of Provider: Provider's Home (private office) Type of Contact: Caregility video visit with audio  Session Content: Today's appointment was a telepsychological visit due to COVID-19. Lydia is aware it is her responsibility to secure confidentiality on her end of the session. She provided verbal consent to proceed with today's appointment. Prior to proceeding with today's appointment, Shaylyn's physical location at the time of this appointment was obtained as well a phone number she could be reached at in the event of technical difficulties. Prosperity is aware of the limitations of teleheath visits and verbally consented to proceed.  This provider conducted a check-in. Ms. Lover endorsed disappointment and anxiety. This Clinical research associate assisted Lilyanna in processing her emotional experiences, reflecting and reinforcing her efforts, problem solving stressors, identifying value congruent actions and signs of ongoing personal growth, and formulating plans. Ms. Kaufmann discussed examples of setting boundaries with some of her grandchildren secondary to ongoing concerns about their behaviors and one of them potentially having stolen money from her; ongoing relational strains with her son and daughters, as well as a romantic interest; and feeling physically ill. Through conversation, she shared how she has paused and considered best uses of her energy and how she would like to respond to the stressors, noting how many of the situations require no further action from her as they involve factors outside her control, that in some situations she plans to enforce a limit and/or distance herself from the person, use of coping strategies  (e.g., diaphragmatic breathing, grounding techniques, expressing gratitude, and challenging negative thoughts) and awareness of warning signs for her wellbeing (e.g., arguing with someone that is intoxicated, impulsively acting while angry, and beliefs that the other person's behaviors are personal). She described how the situations have contributed to stress, but that in the past she would have been much more likely to act in maladaptive ways which would have led to increased psychological suffering. She denied having any other issues or concerns.   Ms. Goodwill did not endorse experiencing suicidal or homicidal ideation, plan, or intent since last meeting with this Clinical research associate and described future plans that include ongoing efforts to improve their wellbeing.   The appointment duration and subsequent use of billing code [id number] was deemed necessary as Ms. Swint has endorsed chronic mental health concerns that result in impairment in daily functioning, meets with this writer on a bi-weekly or less frequency basis, and continues to indicate benefiting from an extended time being allowed to process her experiences and implement the aforementioned interventions.   Devlyn was receptive to today's appointment as evidenced by openness to sharing and responsiveness to feedback.  Interventions:  Provided empathic reflections and validation Reviewed content from the previous session Provided positive reinforcement Employed supportive psychotherapy interventions to facilitate reduced distress and to improve coping skills with identified stressors Engaged patient in goal setting Engaged patient in problem solving Psychoeducation provided regarding cognitive distortions  Psychoeducation provided regarding all-or-nothing thinking Reviewed learned skills  DSM-5 Diagnosis(es):  F43.10 Posttraumtic Stress Disorder  Plan: Loree is currently scheduled for a follow-up appointment on 11/08/2023 at 8am via Caregility  video visit with audio. The next session will focus on value congruent actions.   Treatment Goal & Progress:  1. Related Problem: Eliminate or reduce the negative impact  trauma related symptoms have on  social, occupational, and family functioning. Description: Routinely use discussed therapeutic skills during stressors, relational issues,  and/or trauma reminders. Target Date: 2024-01-16 Frequency: Weekly Modality: individual Progress: 90%  2. Related Problem: Eliminate or reduce the negative impact trauma related symptoms have on  social, occupational, and family functioning. Description: Participate in Acceptance and Commitment Therapy (ACT) to reduce the impact  of the traumatic event. Target Date: 2022-01-15 Frequency: Weekly Modality: individual Progress: 100% ? Planned Intervention: Use an ACT approach to PTSD to help the client experience and  accept the presence of troubling thoughts and images without being overly impacted by  them, and committing his/her time and efforts to activities that are consistent with  identified, personally meaningful values. 3. Related Problem: Eliminate or reduce the negative impact trauma related symptoms have on social, occupational, and family functioning. Description: Learn and implement personal skills to manage challenging situations related to trauma. Target Date: 2022-01-15 Frequency: Weekly Modality: individual Progress: 100% 4. Related Problem: Eliminate or reduce the negative impact trauma related symptoms have on  social, occupational, and family functioning. Description: Participate in Cognitive Processing Therapy to process the trauma and reduce its  impact. Target Date: 2022-01-15 Frequency: Weekly Modality: individual Progress: 100% 5. Related Problem: Eliminate or reduce the negative impact trauma related symptoms have on  social, occupational, and family functioning. Description: Verbalize an understanding of the  treatment rationale for PTSD. Target Date: 2022-01-15 Frequency: Weekly Modality: individual Progress: 100% 6. Related Problem: Eliminate or reduce the negative impact trauma related symptoms have on  social, occupational, and family functioning. Description: Learn and implement calming skills. Target Date: 2024-01-16 Frequency: Weekly Modality: individual Progress: 90% ? Planned Intervention: Teach the client calming skills (e.g., breathing retraining, relaxation,calming self-talk) to use in and between sessions when feeling overly distressed.   Ms. Fayson was agreeable with the treatment plan.                Frederic ONEIDA Fire, PsyD

## 2023-11-08 ENCOUNTER — Ambulatory Visit (INDEPENDENT_AMBULATORY_CARE_PROVIDER_SITE_OTHER): Admitting: Psychology

## 2023-11-08 DIAGNOSIS — F4310 Post-traumatic stress disorder, unspecified: Secondary | ICD-10-CM | POA: Diagnosis not present
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
## 2023-11-22 ENCOUNTER — Ambulatory Visit: Admitting: Psychology

## 2023-11-22 DIAGNOSIS — F4310 Post-traumatic stress disorder, unspecified: Secondary | ICD-10-CM

## 2023-11-22 NOTE — Progress Notes (Signed)
 Date: 11/22/2023 Appointment Start Time: 8:05am Duration: 55 minutes Provider: Frederic Fire, PsyD Type of Session: Individual Therapy  Location of Patient: Home (private location) Location of Provider: Provider's Home (private office) Type of Contact: Caregility video visit with audio  Session Content: Today's appointment was a telepsychological visit due to COVID-19. Mikaili is aware it is her responsibility to secure confidentiality on her end of the session. She provided verbal consent to proceed with today's appointment. Prior to proceeding with today's appointment, Danessa's physical location at the time of this appointment was obtained as well a phone number she could be reached at in the event of technical difficulties. Makaylee is aware of the limitations of teleheath visits and verbally consented to proceed.  This provider conducted a check-in. Ms. Kushnir endorsed a low mood and stress. This Clinical research associate assisted Blessin in processing her emotional experiences, reflecting and reinforcing her efforts, problem solving stressors, identifying her values and signs of personal growth, and formulating value congruent plans. Ms. Dortch discussed examples of various relational strains, concerns about others behaviors, and how another aide continued to not show up for her designated hours, noting how this has led to beliefs others are trying to get one over on [her]. She further discussed how despite the aforementioned, she has been increasingly pausing and assessing the various situations, as well as considering value congruent actions. She shared how this has reduced psychological suffering and better allowed her to determine best uses of her energy. She concluded with plans to continue her efforts and increasingly spend time with supportive others. She denied having any other issues or concerns.   Ms. Cutbirth did not endorse experiencing suicidal or homicidal ideation, plan, or intent since last meeting  with this Clinical research associate and described future plans that include ongoing efforts to improve their wellbeing.   The appointment duration and subsequent use of billing code 09162 was deemed necessary as Ms. Frater has endorsed chronic mental health concerns that result in impairment in daily functioning, meets with this writer on a bi-weekly or less frequency basis, and continues to indicate benefiting from an extended time being allowed to process her experiences and implement the aforementioned interventions.   Lynore was receptive to today's appointment as evidenced by openness to sharing and responsiveness to feedback.  Interventions:  Provided empathic reflections and validation Reviewed content from the previous session Provided positive reinforcement Employed supportive psychotherapy interventions to facilitate reduced distress and to improve coping skills with identified stressors Engaged patient in goal setting Engaged patient in problem solving Psychoeducation provided regarding cognitive distortions  Psychoeducation provided regarding self-care Explored patient's values  Reviewed learned skills  DSM-5 Diagnosis(es):  F43.10 Posttraumtic Stress Disorder  Plan: Ebunoluwa is currently scheduled for a follow-up appointment on 12/06/2023 at 8am via Caregility video visit with audio. The next session will focus on value congruent actions.    Treatment Goal & Progress:  1. Related Problem: Eliminate or reduce the negative impact trauma related symptoms have on  social, occupational, and family functioning. Description: Routinely use discussed therapeutic skills during stressors, relational issues,  and/or trauma reminders. Target Date: 2024-01-16 Frequency: Weekly Modality: individual Progress: 90%  2. Related Problem: Eliminate or reduce the negative impact trauma related symptoms have on  social, occupational, and family functioning. Description: Participate in Acceptance and  Commitment Therapy (ACT) to reduce the impact  of the traumatic event. Target Date: 2022-01-15 Frequency: Weekly Modality: individual Progress: 100% ? Planned Intervention: Use an ACT approach to PTSD to help the client experience and  accept the presence of troubling thoughts and images without being overly impacted by  them, and committing his/her time and efforts to activities that are consistent with  identified, personally meaningful values. 3. Related Problem: Eliminate or reduce the negative impact trauma related symptoms have on social, occupational, and family functioning. Description: Learn and implement personal skills to manage challenging situations related to trauma. Target Date: 2022-01-15 Frequency: Weekly Modality: individual Progress: 100% 4. Related Problem: Eliminate or reduce the negative impact trauma related symptoms have on  social, occupational, and family functioning. Description: Participate in Cognitive Processing Therapy to process the trauma and reduce its  impact. Target Date: 2022-01-15 Frequency: Weekly Modality: individual Progress: 100% 5. Related Problem: Eliminate or reduce the negative impact trauma related symptoms have on  social, occupational, and family functioning. Description: Verbalize an understanding of the treatment rationale for PTSD. Target Date: 2022-01-15 Frequency: Weekly Modality: individual Progress: 100% 6. Related Problem: Eliminate or reduce the negative impact trauma related symptoms have on  social, occupational, and family functioning. Description: Learn and implement calming skills. Target Date: 2024-01-16 Frequency: Weekly Modality: individual Progress: 90% ? Planned Intervention: Teach the client calming skills (e.g., breathing retraining, relaxation,calming self-talk) to use in and between sessions when feeling overly distressed.   Ms. Kandel was agreeable with the treatment plan.               Frederic ONEIDA Fire, PsyD

## 2023-12-06 ENCOUNTER — Ambulatory Visit: Admitting: Psychology

## 2023-12-06 DIAGNOSIS — F4310 Post-traumatic stress disorder, unspecified: Secondary | ICD-10-CM

## 2023-12-06 NOTE — Progress Notes (Signed)
 Date: 12/06/2023 Appointment Start Time: 8:03am (pt arrived late to the appointment) Duration: 60 minutes Provider: Frederic Fire, PsyD Type of Session: Individual Therapy  Location of Patient: Home (private location) Location of Provider: Provider's Home (private office) Type of Contact: Caregility video visit with audio  Session Content: Today's appointment was a telepsychological visit due to COVID-19. Courtney Trevino is aware it is her responsibility to secure confidentiality on her end of the session. She provided verbal consent to proceed with today's appointment. Prior to proceeding with today's appointment, Courtney Trevino's physical location at the time of this appointment was obtained as well a phone number she could be reached at in the event of technical difficulties. Courtney Trevino is aware of the limitations of teleheath visits and verbally consented to proceed.  This provider conducted a check-in.Courtney Trevino endorsed ongoing stressors, although an overall improved emotional wellbeing. This Clinical research associate assisted Courtney Trevino in processing her emotional experiences, reflecting and reinforcing her efforts, problem solving stressors, identifying areas within and outside her control, and formulating value congruent plans. Courtney Trevino discussed examples of various relational strains with family members, noting how in some instances there is nothing more for her to do, and how some things are within and outside her control. She also discussed how ongoing medical issues, and increased insight to them have led to her increasingly recognizing she is not to blame for the medical issues. She reported how through pausing and considering her values have led to her determining best uses of her energy. Through conversations, she concluded with plans to keep an open dialogue with her son, to consult with a legal professional regarding ongoing medical issues, and to continue expressing her concerns to one of her daughters. She denied  having any other issues or concerns.  Courtney Trevino did not endorse experiencing suicidal or homicidal ideation, plan, or intent since last meeting with this Clinical research associate and described future plans that include ongoing efforts to improve their wellbeing.   The appointment duration and subsequent use of billing code [id number] was deemed necessary as the patient has endorsed chronic mental health concerns, self-expression difficulties, and/or neurodevelopmental disorders that result in significant impairment in daily functioning; meet with this writer on a bi-weekly or less frequency basis, and continue to indicate benefiting from an extended time being allowed to process her experiences and implement the aforementioned interventions.   Courtney Trevino was receptive to today's appointment as evidenced by openness to sharing and responsiveness to feedback.  Interventions:  Provided empathic reflections and validation Reviewed content from the previous session Provided positive reinforcement Employed supportive psychotherapy interventions to facilitate reduced distress and to improve coping skills with identified stressors Engaged patient in goal setting Engaged patient in problem solving Psychoeducation provided regarding cognitive distortions  Explored patient's values  Reviewed learned skills  DSM-5 Diagnosis(es):  F43.10 Posttraumtic Stress Disorder  Plan: Courtney Trevino is currently scheduled for a follow-up appointment on 12/20/2023 at 8am via Caregility video visit with audio. The next session will focus on value congruent actions.   Treatment Goal & Progress:  1. Related Problem: Eliminate or reduce the negative impact trauma related symptoms have on  social, occupational, and family functioning. Description: Routinely use discussed therapeutic skills during stressors, relational issues,  and/or trauma reminders. Target Date: 2024-01-16 Frequency: Weekly Modality: individual Progress: 90%  2. Related  Problem: Eliminate or reduce the negative impact trauma related symptoms have on  social, occupational, and family functioning. Description: Participate in Acceptance and Commitment Therapy (ACT) to reduce the impact  of the traumatic event. Target  Date: 2022-01-15 Frequency: Weekly Modality: individual Progress: 100% ? Planned Intervention: Use an ACT approach to PTSD to help the client experience and  accept the presence of troubling thoughts and images without being overly impacted by  them, and committing his/her time and efforts to activities that are consistent with  identified, personally meaningful values. 3. Related Problem: Eliminate or reduce the negative impact trauma related symptoms have on social, occupational, and family functioning. Description: Learn and implement personal skills to manage challenging situations related to trauma. Target Date: 2022-01-15 Frequency: Weekly Modality: individual Progress: 100% 4. Related Problem: Eliminate or reduce the negative impact trauma related symptoms have on  social, occupational, and family functioning. Description: Participate in Cognitive Processing Therapy to process the trauma and reduce its  impact. Target Date: 2022-01-15 Frequency: Weekly Modality: individual Progress: 100% 5. Related Problem: Eliminate or reduce the negative impact trauma related symptoms have on  social, occupational, and family functioning. Description: Verbalize an understanding of the treatment rationale for PTSD. Target Date: 2022-01-15 Frequency: Weekly Modality: individual Progress: 100% 6. Related Problem: Eliminate or reduce the negative impact trauma related symptoms have on  social, occupational, and family functioning. Description: Learn and implement calming skills. Target Date: 2024-01-16 Frequency: Weekly Modality: individual Progress: 90% ? Planned Intervention: Teach the client calming skills (e.g., breathing retraining,  relaxation,calming self-talk) to use in and between sessions when feeling overly distressed.   Courtney Trevino was agreeable with the treatment plan.               Frederic ONEIDA Fire, PsyD

## 2023-12-20 ENCOUNTER — Ambulatory Visit (INDEPENDENT_AMBULATORY_CARE_PROVIDER_SITE_OTHER): Admitting: Psychology

## 2023-12-20 DIAGNOSIS — F4310 Post-traumatic stress disorder, unspecified: Secondary | ICD-10-CM

## 2023-12-20 NOTE — Progress Notes (Signed)
 Date: 12/20/2023 Appointment Start Time: 8:06am (pt and this clinical research associate briefly left and then rejoined the appointment at 8:07am secondary to tech issues that were occurring for both) Duration: 69 minutes Provider: Frederic Fire, PsyD Type of Session: Individual Therapy  Location of Patient: Home (private location) Location of Provider: Provider's Home (private office) Type of Contact: Caregility video visit with audio  Session Content: Today's appointment was a telepsychological visit. Deryn is aware it is her responsibility to secure confidentiality on her end of the session. She provided verbal consent to proceed with today's appointment. Prior to proceeding with today's appointment, Tiffannie's physical location at the time of this appointment was obtained as well a phone number she could be reached at in the event of technical difficulties. Aliyanah is aware of the limitations of teleheath visits and verbally consented to proceed.  This provider conducted a check-in.  Ms. Sofia endorsed an overall improved emotional wellbeing. This clinical research associate assisted Liah in processing her emotional experiences, reflecting and reinforcing her efforts, identifying her values and priorities, problem solving stressors, and formulating value congruent goals. Ms. Sanjose discussed how being diagnosed with a medical condition has changed her perception of herself, others, and better allowed her to manage her physical and mental wellbeing, noting various examples of how past medical care had missed this medical issues and/or attributed its symptoms to psychological and/or other medical issues. She described how since being diagnosed she has notified her children and recommended genetic testing, prioritized rest and self-care, reinforced her longstanding medical concerns were not attributable to a characterological issues (e.g., laziness), prioritized water intake, and led to dietary and footwear changes. She note. She also  described how joining a buy nothing, sell nothing group has led to her meeting various others with similar values and increased comfort with asking others for assistance. Through conversation and problem solving, she concluded with plans to continue her aforementioned efforts, to increase her water intake through use of a big cup and having ice in her water, and to speak with her medical care team about a medication that may assist with menopause, weight loss, and improving sleep quality. She denied having any other issues or concerns.   Ms. Pellegrin did not endorse experiencing suicidal or homicidal ideation, plan, or intent since last meeting with this clinical research associate and described future plans that include ongoing efforts to improve their wellbeing.   The appointment duration and subsequent use of billing code 09162 was deemed necessary as the patient has endorsed chronic mental health concerns, self-expression difficulties, and/or neurodevelopmental disorders that result in significant impairment in daily functioning; meet with this writer on a bi-weekly or less frequency basis, and continue to indicate benefiting from an extended time being allowed to process her experiences and implement the aforementioned interventions.   Tenaya was receptive to today's appointment as evidenced by openness to sharing and responsiveness to feedback.  Interventions:  Provided empathic reflections and validation Reviewed content from the previous session Provided positive reinforcement Employed supportive psychotherapy interventions to facilitate reduced distress and to improve coping skills with identified stressors Engaged patient in goal setting Engaged patient in problem solving Psychoeducation provided regarding cognitive distortions  Psychoeducation provided regarding self-care Explored patient's values  Reviewed learned skills  DSM-5 Diagnosis(es):  F43.10 Posttraumtic Stress Disorder  Plan: Mayumi is  currently scheduled for a follow-up appointment on 01/03/2024 at 8am via Caregility video visit with audio. The next session will focus on value congruent actions.   Treatment Goal & Progress:  1. Related Problem: Eliminate  or reduce the negative impact trauma related symptoms have on  social, occupational, and family functioning. Description: Routinely use discussed therapeutic skills during stressors, relational issues,  and/or trauma reminders. Target Date: 2024-01-16 Frequency: Weekly Modality: individual Progress: 90%  2. Related Problem: Eliminate or reduce the negative impact trauma related symptoms have on  social, occupational, and family functioning. Description: Participate in Acceptance and Commitment Therapy (ACT) to reduce the impact  of the traumatic event. Target Date: 2022-01-15 Frequency: Weekly Modality: individual Progress: 100% ? Planned Intervention: Use an ACT approach to PTSD to help the client experience and  accept the presence of troubling thoughts and images without being overly impacted by  them, and committing his/her time and efforts to activities that are consistent with  identified, personally meaningful values. 3. Related Problem: Eliminate or reduce the negative impact trauma related symptoms have on social, occupational, and family functioning. Description: Learn and implement personal skills to manage challenging situations related to trauma. Target Date: 2022-01-15 Frequency: Weekly Modality: individual Progress: 100% 4. Related Problem: Eliminate or reduce the negative impact trauma related symptoms have on  social, occupational, and family functioning. Description: Participate in Cognitive Processing Therapy to process the trauma and reduce its  impact. Target Date: 2022-01-15 Frequency: Weekly Modality: individual Progress: 100% 5. Related Problem: Eliminate or reduce the negative impact trauma related symptoms have on  social,  occupational, and family functioning. Description: Verbalize an understanding of the treatment rationale for PTSD. Target Date: 2022-01-15 Frequency: Weekly Modality: individual Progress: 100% 6. Related Problem: Eliminate or reduce the negative impact trauma related symptoms have on  social, occupational, and family functioning. Description: Learn and implement calming skills. Target Date: 2024-01-16 Frequency: Weekly Modality: individual Progress: 90% ? Planned Intervention: Teach the client calming skills (e.g., breathing retraining, relaxation,calming self-talk) to use in and between sessions when feeling overly distressed.   Ms. Morson was agreeable with the treatment plan.               Frederic ONEIDA Fire, PsyD

## 2024-01-03 ENCOUNTER — Ambulatory Visit (INDEPENDENT_AMBULATORY_CARE_PROVIDER_SITE_OTHER): Admitting: Psychology

## 2024-01-03 DIAGNOSIS — F4310 Post-traumatic stress disorder, unspecified: Secondary | ICD-10-CM

## 2024-01-03 NOTE — Progress Notes (Signed)
 Date: 01/03/2024 Appointment Start Time: 8:04am Duration: 61 minutes Provider: Frederic Fire, PsyD Type of Session: Individual Therapy  Location of Patient: Home (private location) Location of Provider: Provider's Home (private office) Type of Contact: Caregility video visit with audio  Session Content: Today's appointment was a telepsychological visit. Courtney Trevino is aware it is her responsibility to secure confidentiality on her end of the session. She provided verbal consent to proceed with today's appointment. Prior to proceeding with today's appointment, Courtney Trevino's physical location at the time of this appointment was obtained as well a phone number she could be reached at in the event of technical difficulties. Courtney Trevino is aware of the limitations of teleheath visits and verbally consented to proceed.  This provider conducted a check-in. Courtney Trevino endorsed a low mood. This clinical research associate assisted Courtney Trevino in processing her emotional experiences, reflecting and reinforcing her efforts and ongoing signs of personal growth, determining priorities, problem solving stressors, and formulating value congruent goals. Courtney Trevino discussed examples of various stressors, concerning events, and a significant allergy flare-up, noting how various other's behaviors have led to limited options to resolve some of the concerns. Through conversation, she discussed how despite the aforementioned, she has been increasingly considering factors within and outside her control, determining best uses of her energy, trusting herself, expressing gratitude for things going well, setting limits with others, implementing safety measures at her home, asking for assistance, using relaxation strategies, and expressing her concerns in a calm and firm manner. She shared how she is proud of her efforts, noting they are value congruent and have led to resolution of some issues. She denied having any other issues or concerns.   Courtney Trevino did  not endorse experiencing suicidal or homicidal ideation, plan, or intent since last meeting with this clinical research associate and described future plans that include ongoing efforts to improve their wellbeing.   The appointment duration and subsequent use of billing code [id number] was deemed necessary as the patient has endorsed chronic mental health concerns, self-expression difficulties, and/or neurodevelopmental disorders that result in significant impairment in daily functioning; meet with this writer on a bi-weekly or less frequency basis, and continue to indicate benefiting from an extended time being allowed to process her experiences and implement the aforementioned interventions.    Courtney Trevino was receptive to today's appointment as evidenced by openness to sharing and responsiveness to feedback.  Interventions:  Provided empathic reflections and validation Reviewed content from the previous session Provided positive reinforcement Employed supportive psychotherapy interventions to facilitate reduced distress and to improve coping skills with identified stressors Engaged patient in goal setting Engaged patient in problem solving Psychoeducation provided regarding self-care Reviewed learned skills  DSM-5 Diagnosis(es):  F43.10 Posttraumtic Stress Disorder  Plan: Courtney Trevino is currently scheduled for a follow-up appointment on 01/17/2024 at 8am via Caregility video visit with audio. The next session will focus on value congruent actions.   Treatment Goal & Progress:  1. Related Problem: Eliminate or reduce the negative impact trauma related symptoms have on  social, occupational, and family functioning. Description: Routinely use discussed therapeutic skills during stressors, relational issues,  and/or trauma reminders. Target Date: 2024-01-16 Frequency: Weekly Modality: individual Progress: 90%  2. Related Problem: Eliminate or reduce the negative impact trauma related symptoms have on  social,  occupational, and family functioning. Description: Participate in Acceptance and Commitment Therapy (ACT) to reduce the impact  of the traumatic event. Target Date: 2022-01-15 Frequency: Weekly Modality: individual Progress: 100% ? Planned Intervention: Use an ACT approach to PTSD to help the  client experience and  accept the presence of troubling thoughts and images without being overly impacted by  them, and committing his/her time and efforts to activities that are consistent with  identified, personally meaningful values. 3. Related Problem: Eliminate or reduce the negative impact trauma related symptoms have on social, occupational, and family functioning. Description: Learn and implement personal skills to manage challenging situations related to trauma. Target Date: 2022-01-15 Frequency: Weekly Modality: individual Progress: 100% 4. Related Problem: Eliminate or reduce the negative impact trauma related symptoms have on  social, occupational, and family functioning. Description: Participate in Cognitive Processing Therapy to process the trauma and reduce its  impact. Target Date: 2022-01-15 Frequency: Weekly Modality: individual Progress: 100% 5. Related Problem: Eliminate or reduce the negative impact trauma related symptoms have on  social, occupational, and family functioning. Description: Verbalize an understanding of the treatment rationale for PTSD. Target Date: 2022-01-15 Frequency: Weekly Modality: individual Progress: 100% 6. Related Problem: Eliminate or reduce the negative impact trauma related symptoms have on  social, occupational, and family functioning. Description: Learn and implement calming skills. Target Date: 2024-01-16 Frequency: Weekly Modality: individual Progress: 90% ? Planned Intervention: Teach the client calming skills (e.g., breathing retraining, relaxation,calming self-talk) to use in and between sessions when feeling overly  distressed.   Courtney Trevino was agreeable with the treatment plan.                Frederic ONEIDA Fire, PsyD

## 2024-01-17 ENCOUNTER — Ambulatory Visit: Admitting: Psychology

## 2024-01-17 DIAGNOSIS — F4310 Post-traumatic stress disorder, unspecified: Secondary | ICD-10-CM

## 2024-01-17 NOTE — Progress Notes (Signed)
 Date: 01/17/2024 Appointment Start Time: 8:02am Duration: 63 minutes Provider: Frederic Fire, PsyD Type of Session: Individual Therapy  Location of Patient: Home (private location) Location of Provider: Provider's Home (private office) Type of Contact: Caregility video visit with audio  Session Content: Today's appointment was a telepsychological visit. Athalia is aware it is her responsibility to secure confidentiality on her end of the session. She provided verbal consent to proceed with today's appointment. Prior to proceeding with today's appointment, Margarita's physical location at the time of this appointment was obtained as well a phone number she could be reached at in the event of technical difficulties. Lakeasha is aware of the limitations of teleheath visits and verbally consented to proceed.  This provider conducted a check-in. Ms. Kube endorsed disappointment. This clinical research associate assisted Pearlene in processing her emotional experiences, reflecting and reinforcing her efforts, problem solving stressors, reflecting ongoing signs of personal growth, and formulating value congruent goals. Ms. Que discussed examples of various relational strains with others, noting how they stem from inadequate communication, a history of being unreliable to follow through on commitments, and downplaying of her physical status by others. She further discussed how she worked to navigate these situations by considering her values, expressing concerns, acknowledging painful truths and various factors that are outside her control, and reminding herself how various others behaviors are not personal. She noted how these efforts reduced psychological suffering, better allowed her to recognize things going well and to plan for various obstacles, and led to enjoying Thanksgiving despite the lingering upset. She described how these actions differed from her behaviors in past similar situations. She denied having any other  issues or concerns.   Ms. Farrelly did not endorse experiencing suicidal or homicidal ideation, plan, or intent since last meeting with this clinical research associate and described future plans that include ongoing efforts to improve their wellbeing.   The appointment duration and subsequent use of billing code [id number] was deemed necessary as the patient has endorsed chronic mental health concerns, self-expression difficulties, and/or neurodevelopmental disorders that result in significant impairment in daily functioning; meet with this writer on a bi-weekly or less frequency basis, and continue to indicate benefiting from an extended time being allowed to process her experiences and implement the aforementioned interventions.    Allyn was receptive to today's appointment as evidenced by openness to sharing and responsiveness to feedback.  Interventions:  Provided empathic reflections and validation Reviewed content from the previous session Provided positive reinforcement Employed supportive psychotherapy interventions to facilitate reduced distress and to improve coping skills with identified stressors Engaged patient in goal setting Engaged patient in problem solving Psychoeducation provided regarding cognitive distortions  Reviewed learned skills  DSM-5 Diagnosis(es):  F43.10 Posttraumtic Stress Disorder  Plan: Bianca is currently scheduled for a follow-up appointment on 01/31/2024 at 8am via Caregility video visit with audio. The next session will focus on value congruent actions.    Treatment Goal & Progress:  1. Related Problem: Eliminate or reduce the negative impact trauma related symptoms have on  social, occupational, and family functioning. Description: Routinely use discussed therapeutic skills during stressors, relational issues,  and/or trauma reminders. Target Date: 2024-01-16 Frequency: Weekly Modality: individual Progress: 90%  2. Related Problem: Eliminate or reduce the negative  impact trauma related symptoms have on  social, occupational, and family functioning. Description: Participate in Acceptance and Commitment Therapy (ACT) to reduce the impact  of the traumatic event. Target Date: 2022-01-15 Frequency: Weekly Modality: individual Progress: 100% ? Planned Intervention: Use an ACT approach to  PTSD to help the client experience and  accept the presence of troubling thoughts and images without being overly impacted by  them, and committing his/her time and efforts to activities that are consistent with  identified, personally meaningful values. 3. Related Problem: Eliminate or reduce the negative impact trauma related symptoms have on social, occupational, and family functioning. Description: Learn and implement personal skills to manage challenging situations related to trauma. Target Date: 2022-01-15 Frequency: Weekly Modality: individual Progress: 100% 4. Related Problem: Eliminate or reduce the negative impact trauma related symptoms have on  social, occupational, and family functioning. Description: Participate in Cognitive Processing Therapy to process the trauma and reduce its  impact. Target Date: 2022-01-15 Frequency: Weekly Modality: individual Progress: 100% 5. Related Problem: Eliminate or reduce the negative impact trauma related symptoms have on  social, occupational, and family functioning. Description: Verbalize an understanding of the treatment rationale for PTSD. Target Date: 2022-01-15 Frequency: Weekly Modality: individual Progress: 100% 6. Related Problem: Eliminate or reduce the negative impact trauma related symptoms have on  social, occupational, and family functioning. Description: Learn and implement calming skills. Target Date: 2024-01-16 Frequency: Weekly Modality: individual Progress: 90% ? Planned Intervention: Teach the client calming skills (e.g., breathing retraining, relaxation,calming self-talk) to use in and  between sessions when feeling overly distressed.   Ms. Matera was agreeable with the treatment plan.              Frederic ONEIDA Fire, PsyD

## 2024-01-31 ENCOUNTER — Ambulatory Visit: Admitting: Psychology

## 2024-01-31 ENCOUNTER — Ambulatory Visit (INDEPENDENT_AMBULATORY_CARE_PROVIDER_SITE_OTHER): Admitting: Psychology

## 2024-01-31 DIAGNOSIS — F4310 Post-traumatic stress disorder, unspecified: Secondary | ICD-10-CM

## 2024-01-31 NOTE — Progress Notes (Addendum)
 Date: 01/31/2024 Appointment Start Time: 8:02am Duration: 60 minutes Provider: Frederic Fire, PsyD Type of Session: Individual Therapy  Location of Patient: Home (private location) Location of Provider: Provider's Home (private office) Type of Contact: Caregility video visit with audio  Session Content: Today's appointment was a telepsychological visit. Courtney Trevino is aware it is her responsibility to secure confidentiality on her end of the session. She provided verbal consent to proceed with today's appointment. Prior to proceeding with today's appointment, Courtney Trevino's physical location at the time of this appointment was obtained as well a phone number she could be reached at in the event of technical difficulties. Oral is aware of the limitations of teleheath visits and verbally consented to proceed.  This provider conducted a check-in. Courtney Trevino endorsed a low mood. This clinical research associate assisted Timiko in processing her emotional experiences, reflecting and reinforcing her efforts and values, problem solving stressors, and formulating value congruent goals. Courtney Trevino discussed examples of various ongoing relational strains with others, noting how they compare to past trauma. She further discussed while the situations are similar to past trauma, that her responses to them differ significantly, noting how she is increasingly reminding herself of her values, acknowledging painful realities, prioritizing self-care, acknowledging limits and warning signs for her wellbeing, defusing from areas outside her control, and engaging in gentle self-talk. She shared how this has reduced psychological suffering and assisted her in problem solving situations and prioritizing use of her energy.   At the end of the appointment, Courtney Trevino expressed a desire to discuss if she has a negative outlook at the next appointment, expressing ambivalence that this is the case but that someone had told her this. This clinical research associate  provided psychoeducation on, and bibliotherapy for (via email with her verbal consent), Challenging Negative Thoughts. She denied having any other issues or concerns.   Courtney Trevino did not endorse experiencing suicidal or homicidal ideation, plan, or intent since last meeting with this clinical research associate and described future plans that include ongoing efforts to improve her wellbeing.   The appointment duration and subsequent use of billing code 09162 was deemed necessary as Courtney Trevino has endorsed chronic mental health concerns that result in impairment in daily functioning; meets with this writer on a bi-weekly or less frequency basis; and continues to indicate benefiting from an extended time being allowed to process her experiences and implement the aforementioned interventions.    Courtney Trevino was receptive to today's appointment as evidenced by openness to sharing and responsiveness to feedback.  Interventions:  Provided empathic reflections and validation Reviewed content from the previous session Provided positive reinforcement Employed supportive psychotherapy interventions to facilitate reduced distress and to improve coping skills with identified stressors Engaged patient in goal setting Engaged patient in problem solving Psychoeducation provided regarding cognitive distortions  Psychoeducation provided regarding self-care Explored patient's values  Reviewed learned skills Psychoeducation regarding challenging negative thoughts  DSM-5 Diagnosis(es):  F43.10 Posttraumtic Stress Disorder  Plan: [name] is currently scheduled for a follow-up appointment on 02/14/2024 at 8am via Caregility video visit with audio. The next session will focus on value congruent actions.   Treatment Goal & Progress:  1. Related Problem: Eliminate or reduce the negative impact trauma related symptoms have on  social, occupational, and family functioning. Description: Routinely use discussed therapeutic skills  during stressors, relational issues,  and/or trauma reminders. Target Date: 2025-01-15 Frequency: Weekly Modality: individual Progress: 90%  2. Related Problem: Eliminate or reduce the negative impact trauma related symptoms have on  social, occupational, and family  functioning. Description: Participate in Acceptance and Commitment Therapy (ACT) to reduce the impact  of the traumatic event. Target Date: 2022-01-15 Frequency: Weekly Modality: individual Progress: 100% ? Planned Intervention: Use an ACT approach to PTSD to help the client experience and  accept the presence of troubling thoughts and images without being overly impacted by  them, and committing his/her time and efforts to activities that are consistent with  identified, personally meaningful values. 3. Related Problem: Eliminate or reduce the negative impact trauma related symptoms have on social, occupational, and family functioning. Description: Learn and implement personal skills to manage challenging situations related to trauma. Target Date: 2022-01-15 Frequency: Weekly Modality: individual Progress: 100% 4. Related Problem: Eliminate or reduce the negative impact trauma related symptoms have on  social, occupational, and family functioning. Description: Participate in Cognitive Processing Therapy to process the trauma and reduce its  impact. Target Date: 2022-01-15 Frequency: Weekly Modality: individual Progress: 100% 5. Related Problem: Eliminate or reduce the negative impact trauma related symptoms have on  social, occupational, and family functioning. Description: Verbalize an understanding of the treatment rationale for PTSD. Target Date: 2022-01-15 Frequency: Weekly Modality: individual Progress: 100% 6. Related Problem: Eliminate or reduce the negative impact trauma related symptoms have on  social, occupational, and family functioning. Description: Learn and implement calming skills. Target Date:  2025-01-15 Frequency: Weekly Modality: individual Progress: 90% ? Planned Intervention: Teach the client calming skills (e.g., breathing retraining, relaxation,calming self-talk) to use in and between sessions when feeling overly distressed.   Ms. Lightsey was agreeable with the treatment plan.               Frederic ONEIDA Fire, PsyD

## 2024-02-14 ENCOUNTER — Ambulatory Visit: Admitting: Psychology

## 2024-02-14 DIAGNOSIS — F4310 Post-traumatic stress disorder, unspecified: Secondary | ICD-10-CM

## 2024-02-14 NOTE — Progress Notes (Signed)
 Date: 02/14/2024 Appointment Start Time: 8:01am Duration: 62 minutes Provider: Frederic Fire, PsyD Type of Session: Individual Therapy  Location of Patient: Home (private location) Location of Provider: Provider's Home (private office) Type of Contact: Caregility video visit with audio  Session Content: Today's appointment was a telepsychological visit. Courtney Trevino is aware it is her responsibility to secure confidentiality on her end of the session. She provided verbal consent to proceed with today's appointment. Prior to proceeding with today's appointment, Courtney Trevino's physical location at the time of this appointment was obtained as well a phone number she could be reached at in the event of technical difficulties. Courtney Trevino is aware of the limitations of teleheath visits and verbally consented to proceed.  This provider conducted a check-in. Courtney Trevino endorsed frustration. This clinical research associate assisted Courtney Trevino in processing her emotional experiences, reflecting and reinforcing her efforts, problem solving stressors, identifying obstacles to desired actions, providing psychoeducation on communication, and formulating value congruent plans.  Courtney Trevino discussed examples of various relational strains that have led to frustration and emotionally distancing herself from the associated others, noting how past trauma and other's longstanding demonstration of executive functioning-related issues are contributing to communication issues and emotional upset. Through conversation, she discussed how other's behaviors are not personal, how some of what is routine actions for her are new ways of being/not routine for others, and how providing solutions and logic-based explanations are often not helpful in these situations. Through examining her values and desired changes, she concluded with plans to utilize I statements to express her feelings and understanding of the situation, increasingly asking the other person questions  to assist them in determining actions they deem best (versus telling them her perceived best actions to take), to be mindful of her warning signs (e.g., eye redness and/or puffiness), set or maintain boundaries as needed, and to continue engaging in self-care, expressing confidence in her ability to do so. With her verbal consent, this clinical research associate sent a book recommendation and YouTube video that provided additional information and strategies that may assist with situations she described. She denied having any other issues or concerns.   Courtney Trevino did not endorse experiencing suicidal or homicidal ideation, plan, or intent since last meeting with this clinical research associate and described future plans that include ongoing efforts to improve her wellbeing.   The appointment duration and subsequent use of billing code [id number] was deemed necessary as Courtney Trevino has endorsed chronic mental health concerns that result in impairment in daily functioning; meets with this writer on a bi-weekly or less frequency basis; and continues to indicate benefiting from an extended time being allowed to process her experiences and implement the aforementioned interventions.    Courtney Trevino was receptive to today's appointment as evidenced by openness to sharing and responsiveness to feedback.  Interventions:  Provided empathic reflections and validation Reviewed content from the previous session Provided positive reinforcement Employed supportive psychotherapy interventions to facilitate reduced distress and to improve coping skills with identified stressors Engaged patient in goal setting Engaged patient in problem solving Psychoeducation provided regarding cognitive distortions  Psychoeducation provided regarding self-care Explored patient's values  Reviewed learned skills  DSM-5 Diagnosis(es):  F43.10 Posttraumtic Stress Disorder  Plan: Courtney Trevino is currently scheduled for a follow-up appointment on 02/28/2024 at 4pm via Caregility video  visit with audio. The next session will focus on value congruent actions.   Treatment Goal & Progress:  1. Related Problem: Eliminate or reduce the negative impact trauma related symptoms have on  social, occupational, and family functioning. Description:  Routinely use discussed therapeutic skills during stressors, relational issues,  and/or trauma reminders. Target Date: 2025-01-15 Frequency: Weekly Modality: individual Progress: 90%  2. Related Problem: Eliminate or reduce the negative impact trauma related symptoms have on  social, occupational, and family functioning. Description: Participate in Acceptance and Commitment Therapy (ACT) to reduce the impact  of the traumatic event. Target Date: 2022-01-15 Frequency: Weekly Modality: individual Progress: 100% ? Planned Intervention: Use an ACT approach to PTSD to help the client experience and  accept the presence of troubling thoughts and images without being overly impacted by  them, and committing his/her time and efforts to activities that are consistent with  identified, personally meaningful values. 3. Related Problem: Eliminate or reduce the negative impact trauma related symptoms have on social, occupational, and family functioning. Description: Learn and implement personal skills to manage challenging situations related to trauma. Target Date: 2022-01-15 Frequency: Weekly Modality: individual Progress: 100% 4. Related Problem: Eliminate or reduce the negative impact trauma related symptoms have on  social, occupational, and family functioning. Description: Participate in Cognitive Processing Therapy to process the trauma and reduce its  impact. Target Date: 2022-01-15 Frequency: Weekly Modality: individual Progress: 100% 5. Related Problem: Eliminate or reduce the negative impact trauma related symptoms have on  social, occupational, and family functioning. Description: Verbalize an understanding of the treatment  rationale for PTSD. Target Date: 2022-01-15 Frequency: Weekly Modality: individual Progress: 100% 6. Related Problem: Eliminate or reduce the negative impact trauma related symptoms have on  social, occupational, and family functioning. Description: Learn and implement calming skills. Target Date: 2025-01-15 Frequency: Weekly Modality: individual Progress: 90% ? Planned Intervention: Teach the client calming skills (e.g., breathing retraining, relaxation,calming self-talk) to use in and between sessions when feeling overly distressed.   Courtney Trevino was agreeable with the treatment plan.                Frederic ONEIDA Fire, PsyD

## 2024-02-28 ENCOUNTER — Ambulatory Visit: Admitting: Psychology

## 2024-02-28 DIAGNOSIS — F4310 Post-traumatic stress disorder, unspecified: Secondary | ICD-10-CM

## 2024-02-28 NOTE — Progress Notes (Signed)
 Date: 02/28/2024 Appointment Start Time: 4:06pm Duration: 58 minutes Provider: Frederic Fire, PsyD Type of Session: Individual Therapy  Location of Patient: Home (private location) Location of Provider: Provider's Home (private office) Type of Contact: Caregility video visit with audio  Session Content: Today's appointment was a telepsychological visit. Nada is aware it is her responsibility to secure confidentiality on her end of the session. She provided verbal consent to proceed with today's appointment. Prior to proceeding with today's appointment, Donnamaria's physical location at the time of this appointment was obtained as well a phone number she could be reached at in the event of technical difficulties. Kimberlye is aware of the limitations of teleheath visits and verbally consented to proceed.  This provider conducted a check-in. Ms. Bruso endorsed a low mood. This clinical research associate assisted Ralyn in processing her emotional experiences, reflecting and reinforcing her efforts, problem solving stressors, identifying her values, and formulating value congruent plans. Ms. Reedy discussed stressors that include allergies and relational strains with her significant other and concerns about his actions. She expressed ambivalence on how to proceed with her concerns about her boyfriend. Through conversation, she shared examples of how certain behaviors are long-standing patterns and others are newer, adding how for her and his wellbeing her top desire for him is to find stable income, noting how his current efforts are likely not going to be successful in this endeavor and could lead to various negative repercussions. She further described how she would ultimately like to see him engaging in a consistent effort in this area and in communicating with her and others honestly, adding how she can't be there for him if he is not being there for himself. She noted how given the aforementioned, she plans to  continue honoring his indicated desire for space and to prioritize her time managing various medical concerns she is experiencing, and spending time with friends and supportive others. She denied having any other issues or concerns.   This clinical research associate notified Ms. Mettler of Medicare's annual in-person requirement that is set to start on 03/16/2024. Ms. Chaudhuri expressed understanding and shared openness to meeting with this writer at his in-person office for her scheduled 03/27/2024 appointment. That appointment was changed from virtual to in-person.   Ms. Formby did not endorse experiencing suicidal or homicidal ideation, plan, or intent since last meeting with this clinical research associate and described future plans that include ongoing efforts to improve her wellbeing.   The appointment duration and subsequent use of billing code [id number] was deemed necessary as Ms. Dante has endorsed chronic mental health concerns that result in impairment in daily functioning; meets with this writer on a bi-weekly or less frequency basis; and continues to indicate benefiting from an extended time being allowed to process her experiences and implement the aforementioned interventions.    Jonel was receptive to today's appointment as evidenced by openness to sharing and responsiveness to feedback.  Interventions:  Provided empathic reflections and validation Reviewed content from the previous session Provided positive reinforcement Employed supportive psychotherapy interventions to facilitate reduced distress and to improve coping skills with identified stressors Engaged patient in goal setting Engaged patient in problem solving Psychoeducation provided regarding cognitive distortions  Psychoeducation provided regarding self-care Explored patient's values  Reviewed learned skills  DSM-5 Diagnosis(es):  F43.10 Posttraumtic Stress Disorder  Plan: Tiffiany is currently scheduled for a follow-up appointment on 03/13/2024 at 8am via  Caregility video visit with audio. The next session will focus on value congruent actions.   Treatment Goal & Progress:  1. Related Problem: Eliminate or reduce the negative impact trauma related symptoms have on  social, occupational, and family functioning. Description: Routinely use discussed therapeutic skills during stressors, relational issues,  and/or trauma reminders. Target Date: 2025-01-15 Frequency: Weekly Modality: individual Progress: 90%  2. Related Problem: Eliminate or reduce the negative impact trauma related symptoms have on  social, occupational, and family functioning. Description: Participate in Acceptance and Commitment Therapy (ACT) to reduce the impact  of the traumatic event. Target Date: 2022-01-15 Frequency: Weekly Modality: individual Progress: 100% ? Planned Intervention: Use an ACT approach to PTSD to help the client experience and  accept the presence of troubling thoughts and images without being overly impacted by  them, and committing his/her time and efforts to activities that are consistent with  identified, personally meaningful values. 3. Related Problem: Eliminate or reduce the negative impact trauma related symptoms have on social, occupational, and family functioning. Description: Learn and implement personal skills to manage challenging situations related to trauma. Target Date: 2022-01-15 Frequency: Weekly Modality: individual Progress: 100% 4. Related Problem: Eliminate or reduce the negative impact trauma related symptoms have on  social, occupational, and family functioning. Description: Participate in Cognitive Processing Therapy to process the trauma and reduce its  impact. Target Date: 2022-01-15 Frequency: Weekly Modality: individual Progress: 100% 5. Related Problem: Eliminate or reduce the negative impact trauma related symptoms have on  social, occupational, and family functioning. Description: Verbalize an understanding of  the treatment rationale for PTSD. Target Date: 2022-01-15 Frequency: Weekly Modality: individual Progress: 100% 6. Related Problem: Eliminate or reduce the negative impact trauma related symptoms have on  social, occupational, and family functioning. Description: Learn and implement calming skills. Target Date: 2025-01-15 Frequency: Weekly Modality: individual Progress: 90% ? Planned Intervention: Teach the client calming skills (e.g., breathing retraining, relaxation,calming self-talk) to use in and between sessions when feeling overly distressed.   Ms. Howk was agreeable with the treatment plan.               Frederic ONEIDA Fire, PsyD

## 2024-03-13 ENCOUNTER — Ambulatory Visit: Admitting: Psychology

## 2024-03-13 DIAGNOSIS — F4310 Post-traumatic stress disorder, unspecified: Secondary | ICD-10-CM

## 2024-03-13 NOTE — Progress Notes (Signed)
 Date: 03/13/2024 Appointment Start Time: 8:06am (pt arrived late to the appointment) Duration: 62 minutes Provider: Frederic Fire, PsyD Type of Session: Individual Therapy  Location of Patient: Home (private location) Location of Provider: Provider's Home (private office) Type of Contact: Caregility video visit with audio  Session Content: Today's appointment was a telepsychological visit. Juliya is aware it is her responsibility to secure confidentiality on her end of the session. She provided verbal consent to proceed with today's appointment. Prior to proceeding with today's appointment, Roshan's physical location at the time of this appointment was obtained as well a phone number she could be reached at in the event of technical difficulties. Elayna is aware of the limitations of teleheath visits and verbally consented to proceed.  This provider conducted a check-in. Ms. Horwitz endorsed a low mood. This clinical research associate assisted Breda in processing her emotional experiences, reflecting and reinforcing her efforts, identifying priorities and warning signs for her wellbeing, problem solving stressors, and formulating value congruent goals.  Ms. Dicesare discussed examples of uncertainty around medical care for various medical concerns, relational strains with various family members, ongoing trouble finding an in-home aide, and ongoing uncertainty around financial assistance has led to low mood, feelings of overwhelm, and social withdrawal. However, she expressed appreciation for unexpected assistance a stranger from a Facebook group provided her. She described her social withdrawal as a warning sign for her wellbeing, and through conversation stated plans to prioritize her medical status, efforts to resolve financial concerns, and keeping in contact with trusted others, adding how there is currently nothing more for her to with the other concerns. With her verbal consent, this clinical research associate sent an email with a  resource for an endorsed medical concern she is experiencing and the physical address of this writer's in-person appointment. She continued to state a plan to attend the next appointment in-person. She denied having any other issues or concerns.   Ms. Mandella did not endorse experiencing suicidal or homicidal ideation, plan, or intent since last meeting with this clinical research associate and described future plans that include ongoing efforts to improve her wellbeing.   The appointment duration and subsequent use of billing code [id number] was deemed necessary as Ms. Allington has endorsed chronic mental health concerns that result in impairment in daily functioning; meets with this writer on a bi-weekly or less frequency basis; and continues to indicate benefiting from an extended time being allowed to process her experiences and implement the aforementioned interventions.    Chen was receptive to today's appointment as evidenced by openness to sharing and responsiveness to feedback.  Interventions:  Provided empathic reflections and validation Reviewed content from the previous session Provided positive reinforcement Employed supportive psychotherapy interventions to facilitate reduced distress and to improve coping skills with identified stressors Engaged patient in goal setting Engaged patient in problem solving Psychoeducation provided regarding cognitive distortions  Psychoeducation provided regarding all-or-nothing thinking Psychoeducation provided regarding self-care Explored patient's values  Reviewed learned skills  DSM-5 Diagnosis(es):  F43.10 Posttraumtic Stress Disorder  Plan: Maleia is currently scheduled for a follow-up appointment on 03/27/2024 at 12pm via in-person. The next session will focus on value congruent actions.   Treatment Goal & Progress:  1. Related Problem: Eliminate or reduce the negative impact trauma related symptoms have on  social, occupational, and family  functioning. Description: Routinely use discussed therapeutic skills during stressors, relational issues,  and/or trauma reminders. Target Date: 2025-01-15 Frequency: Weekly Modality: individual Progress: 90%  2. Related Problem: Eliminate or reduce the negative impact trauma related  symptoms have on  social, occupational, and family functioning. Description: Participate in Acceptance and Commitment Therapy (ACT) to reduce the impact  of the traumatic event. Target Date: 2022-01-15 Frequency: Weekly Modality: individual Progress: 100% ? Planned Intervention: Use an ACT approach to PTSD to help the client experience and  accept the presence of troubling thoughts and images without being overly impacted by  them, and committing his/her time and efforts to activities that are consistent with  identified, personally meaningful values. 3. Related Problem: Eliminate or reduce the negative impact trauma related symptoms have on social, occupational, and family functioning. Description: Learn and implement personal skills to manage challenging situations related to trauma. Target Date: 2022-01-15 Frequency: Weekly Modality: individual Progress: 100% 4. Related Problem: Eliminate or reduce the negative impact trauma related symptoms have on  social, occupational, and family functioning. Description: Participate in Cognitive Processing Therapy to process the trauma and reduce its  impact. Target Date: 2022-01-15 Frequency: Weekly Modality: individual Progress: 100% 5. Related Problem: Eliminate or reduce the negative impact trauma related symptoms have on  social, occupational, and family functioning. Description: Verbalize an understanding of the treatment rationale for PTSD. Target Date: 2022-01-15 Frequency: Weekly Modality: individual Progress: 100% 6. Related Problem: Eliminate or reduce the negative impact trauma related symptoms have on  social, occupational, and family  functioning. Description: Learn and implement calming skills. Target Date: 2025-01-15 Frequency: Weekly Modality: individual Progress: 90% ? Planned Intervention: Teach the client calming skills (e.g., breathing retraining, relaxation,calming self-talk) to use in and between sessions when feeling overly distressed.   Ms. Sandall was agreeable with the treatment plan.                Frederic ONEIDA Fire, PsyD

## 2024-03-27 ENCOUNTER — Ambulatory Visit: Admitting: Psychology

## 2024-04-05 ENCOUNTER — Ambulatory Visit: Admitting: Psychology

## 2024-04-10 ENCOUNTER — Ambulatory Visit: Admitting: Psychology

## 2024-04-11 ENCOUNTER — Ambulatory Visit: Admitting: Psychology

## 2024-04-24 ENCOUNTER — Ambulatory Visit: Admitting: Psychology

## 2024-05-08 ENCOUNTER — Ambulatory Visit: Admitting: Psychology
